# Patient Record
Sex: Female | Born: 1970 | Race: White | Hispanic: No | Marital: Married
Health system: Midwestern US, Community
[De-identification: ages and names within clinical notes are randomized; demographics above are authoritative.]

## PROBLEM LIST (undated history)

## (undated) DIAGNOSIS — D6859 Other primary thrombophilia: Secondary | ICD-10-CM

## (undated) DIAGNOSIS — G43909 Migraine, unspecified, not intractable, without status migrainosus: Secondary | ICD-10-CM

## (undated) DIAGNOSIS — M797 Fibromyalgia: Secondary | ICD-10-CM

## (undated) DIAGNOSIS — E039 Hypothyroidism, unspecified: Secondary | ICD-10-CM

## (undated) DIAGNOSIS — M5410 Radiculopathy, site unspecified: Secondary | ICD-10-CM

## (undated) DIAGNOSIS — R202 Paresthesia of skin: Secondary | ICD-10-CM

## (undated) DIAGNOSIS — Z87898 Personal history of other specified conditions: Secondary | ICD-10-CM

## (undated) DIAGNOSIS — R200 Anesthesia of skin: Secondary | ICD-10-CM

## (undated) DIAGNOSIS — B001 Herpesviral vesicular dermatitis: Secondary | ICD-10-CM

## (undated) DIAGNOSIS — R413 Other amnesia: Secondary | ICD-10-CM

## (undated) DIAGNOSIS — N6320 Unspecified lump in the left breast, unspecified quadrant: Secondary | ICD-10-CM

## (undated) DIAGNOSIS — N63 Unspecified lump in unspecified breast: Secondary | ICD-10-CM

## (undated) DIAGNOSIS — E538 Deficiency of other specified B group vitamins: Secondary | ICD-10-CM

## (undated) DIAGNOSIS — D122 Benign neoplasm of ascending colon: Secondary | ICD-10-CM

## (undated) DIAGNOSIS — J189 Pneumonia, unspecified organism: Secondary | ICD-10-CM

## (undated) DIAGNOSIS — E119 Type 2 diabetes mellitus without complications: Principal | ICD-10-CM

## (undated) DIAGNOSIS — R110 Nausea: Secondary | ICD-10-CM

## (undated) LAB — MO EKG ELECTROCARDIOGRAM TC
Atrial Rate: 80 {beats}/min
Atrial Rate: 90 {beats}/min
P Axis: -24 degrees
P Axis: 30 degrees
P-R Interval: 148 ms
P-R Interval: 150 ms
Q-T Interval: 360 ms
Q-T Interval: 384 ms
QRS Duration: 80 ms
QRS Duration: 82 ms
QTc Calculation (Bazett): 440 ms
QTc Calculation (Bazett): 442 ms
R Axis: -25 degrees
R Axis: 7 degrees
T Axis: -19 degrees
T Axis: 21 degrees
Ventricular Rate: 80 {beats}/min
Ventricular Rate: 90 {beats}/min

---

## 2010-05-27 ENCOUNTER — Inpatient Hospital Stay: Admit: 2010-05-27 | Discharge: 2010-05-27 | Attending: Emergency Medicine

## 2010-05-27 LAB — COMPREHENSIVE METABOLIC PANEL
ALT: 32 U/L (ref 10–40)
AST: 27 U/L (ref 15–37)
Albumin/Globulin Ratio: 1.6 (ref 1.1–2.2)
Albumin: 4.2 gm/dl (ref 3.4–5.0)
Alkaline Phosphatase: 98 U/L (ref 45–129)
Anion Gap: 7.9
BUN: 14 mg/dl (ref 7–18)
CO2: 27 mEq/L (ref 21–32)
Calcium: 9.2 mg/dl (ref 8.3–10.6)
Chloride: 108 mEq/L (ref 99–110)
Creatinine: 0.8 mg/dl (ref 0.6–1.1)
GFR Est, African/Amer: >60
GFR, Estimated: >60 (ref >60)
Glucose: 148 mg/dl — ABNORMAL HIGH (ref 70–99)
Potassium: 3.5 mEq/L (ref 3.5–5.1)
Sodium: 139 mEq/L (ref 136–145)
Total Bilirubin: 0.4 mg/dl (ref 0.0–1.0)
Total Protein: 6.9 gm/dl (ref 6.4–8.2)

## 2010-05-27 LAB — CBC WITH AUTO DIFFERENTIAL
Basophils %: 0.4 % (ref 0.0–2.0)
Basophils Absolute: 0 10*3 (ref 0.0–0.2)
Eosinophils %: 1.4 % (ref 0.0–5.0)
Eosinophils Absolute: 0.1 10*3 (ref 0.0–0.6)
Granulocyte Absolute Count: 3.1 10*3 (ref 1.7–7.7)
Hematocrit: 34.6 % — ABNORMAL LOW (ref 36.0–48.0)
Hemoglobin: 11.8 gm/dl — ABNORMAL LOW (ref 12.0–16.0)
Lymphocytes %: 35.2 % (ref 25.0–40.0)
Lymphocytes Absolute: 2 10*3 (ref 1.0–5.1)
MCH: 29.5 pg (ref 26–34)
MCHC: 34 gm/dl (ref 31–36)
MCV: 86.9 fl (ref 80–100)
MPV: 9 fl (ref 5.0–10.5)
Monocytes %: 7.8 % (ref 0.0–12.0)
Monocytes Absolute: 0.4 10*3 (ref 0.0–1.3)
Platelets: 265 10*3 (ref 135–450)
RBC: 3.98 10*6 — ABNORMAL LOW (ref 4.0–5.2)
RDW: 12.8 % (ref 11.5–14.5)
Segs Relative: 55.2 % (ref 42.0–63.0)
WBC: 5.6 10*3 (ref 4.0–11.0)

## 2010-05-27 LAB — TROPONIN
Troponin I: <0.006 ng/ml
Troponin I: <0.006 ng/ml

## 2010-05-27 MED ORDER — KETOROLAC TROMETHAMINE 30 MG/ML IJ SOLN
30 MG/ML | Freq: Once | INTRAMUSCULAR | Status: AC
Start: 2010-05-27 — End: 2010-05-27
  Administered 2010-05-27: 05:00:00 30 mg via INTRAVENOUS

## 2010-05-27 MED ORDER — TRAMADOL HCL 50 MG PO TABS
50 MG | Freq: Once | ORAL | Status: AC
Start: 2010-05-27 — End: 2010-05-27
  Administered 2010-05-27: 05:00:00 50 mg via ORAL

## 2010-05-27 MED FILL — KETOROLAC TROMETHAMINE 30 MG/ML IJ SOLN: 30 MG/ML | INTRAMUSCULAR | Qty: 1

## 2010-05-27 MED FILL — ULTRAM 50 MG PO TABS: 50 MG | ORAL | Qty: 1

## 2010-05-27 NOTE — Discharge Instructions (Signed)
Cardiac Biomarkers     Cardiac biomarkers are enzymes, proteins, and hormones that are associated with heart function, damage or failure. Some of the tests are specific for the heart while others are also elevated with skeletal muscle damage. Cardiac biomarkers are used for diagnostic and prognostic purposes and are frequently ordered by caregivers when someone comes into the Emergency Room complaining of symptoms, such as chest pain, pressure, nausea, and shortness of breath. These tests are ordered, along with other laboratory and non-laboratory tests, to detect heart failure (which is often a chronic, progressive condition affecting the ability of the heart to fill with blood and pump efficiently) and the acute coronary syndromes (ACS) as well as to help determine prognosis for people who have had a heart attack. ACS is a group of symptoms that reflect a sudden decrease in the amount of blood and oxygen, also termed 'ischemia,' reaching the heart. This decrease is frequently due to either a narrowing of the coronary arteries (atherosclerosis or vessel spasm) or unstable plaques, which can cause a blood clot (thrombus) and blockage of blood flow. If the oxygen supply is low, it can cause angina (pain); if blood flow is reduced, it can cause death of heart cells (called myocardial infarction or heart attack) and can lead to death of the affected heart muscle cells and to permanent damage and scarring of the heart.   The goal with cardiac biomarkers is to be able to detect the presence and severity of an acute heart condition as soon as possible so that appropriate treatment can be initiated.      There are only a few cardiac biomarkers that are being routinely used by physicians. Some have been phased out because they are not as specific as the marker of choice - troponin. Many other potential cardiac biomarkers are still being researched but their clinical utility has yet to be established.      Note: Cardiac  biomarkers are not the same tests as those that are used to screen the general healthy population for their risk of developing heart disease. Those can be found under Cardiac Risk Assessment.     LABORATORY TESTS CURRENT CARDIAC BIOMARKERS    CK and CK-MB   Troponin     BNP or (NT-proBNP)   Myoglobin (not always used; sometimes ordered with troponin)       MORE GENERAL TESTS FREQUENTLY ORDERED ALONG WITH CARDIAC BIOMARKERS    Blood Gases   CMP   BMP   Electrolytes   CBC       ON THE HORIZON  Ischemia modified albumin (IMA) - Test has received FDA approval for use with troponin and electrocardiogram to rule out acute coronary syndrome (ACS) in patients with chest pain. May become useful for identifying patients at higher risk of heart attack and potentially could replace myoglobin one day.      NON-LABORATORY TESTS  These tests allow caregivers to look at the size, shape, and function of the heart as it is beating. They can be used to detect changes to the rhythm of the heart as well as to detect and evaluate damaged tissues and blocked arteries.    EKG (ECG, electrocardiogram)   Nuclear scan     Coronary angiography (or arteriography)   ECG (echocardiogram)     Stress testing   Chest X-ray       THE FOLLOWING TABLE SUMMARIZES CURRENTLY USED CARDIAC BIOMARKERS.  Marker What Where Found What Indicates Time to Increase Time back to Normal  When/How Used   CK Enzyme that exists in three different isoforms Heart, brain, and skeletal muscle Injury to muscle cells 4 to 6 hours after injury, peaks in 18 to 24 hours Normal in 48 to 72 hours, unless due to continuing injury  Being phased out, may be ordered prior to CK-MB   CK-MB    Heart- related portion of total CK enzyme Heart primarily, but also in skeletal muscle Injury (cell death) to heart 4 to 6 hrs after heart attack, peaks in 12 to 20 hours Returns to normal in 24 to 48 hours unless new/continual damage Not as specific as Troponin for heart  injury/attack, may be ordered when Troponin is not available, may be ordered to monitor new/continuing damage   Myoglobin    Small oxygen-storing protein    Heart and other muscle cells Injury to heart or other muscle cells. Also elevated with kidney problems. Starts to rise within 2 to 3 hours, peaks in 8 to 12 hours.    Falls back to normal by about one day after injury occurred    Ordered along with Troponin, helps diagnose heart injury/attack   Cardiac Troponin Components of a Regulatory protein complex. Two cardiac specific isoforms: T   and I  Heart muscle Heart injury/damage 4 to 8 hours Remains elevated for 7 to 14 days Ordered to help assess prognosis and diagnose heart attack   LDH Enzyme Almost all body tissues General marker of injury to cells     Phased out, not specific   AST> Enzyme Heart, liver, and muscles Injury to liver, muscle, or heart     Phased out, not heart-specific   Hs-CRP Protein Associated with athero-  sclerosis  Inflam-matory process   Elevated with inflammation May help determine prognosis of patients who've had heart attack   BNP Hormone Heart's left ventricle Heart failure   Elevation related to severity Help diagnose and evaluate heart failure, prognosis, and to monitor therapy      FOR MORE INFORMATION, VISIT: [email or web address]     Document Released: 12/25/2004    [facility] Patient Information 2011 [place].Chest Pain (Non-Specific)     Today you have had an exam and tests to determine a specific cause for your chest pain. It is often hard to give a specific diagnosis for the cause of one's chest pain.  There is always a chance that your pain could be related to something serious, like a heart attack or a blood clot in the lungs. You need to follow up with your caregiver for further evaluation. More lab tests or other studies such as x-rays, an electrocardiogram, stress testing, or cardiac imaging may be needed to find the cause of your pain.      Most of the  time, nonspecific chest pain will be improved within 2-3 days of rest and mild pain medicine.  For the next few days, avoid physical exertion or activities that bring on the pain. Do not smoke or drink alcohol until all your symptoms are gone. Quitting smoking is the number one way to reduce your risk for heart and lung disease. Call your caregiver for routine follow-up as advised.      CAUSES   Heart burn is caused by stomach acid going back up into the esophagus. The esophagus is the tube between the mouth and the stomach. The acid burns the sensitive inner layer of the esophagus. This causes pain which is felt in the chest under the breast bone. Heart burn is also  called GERD (gastroesophageal reflux disease).   Pneumonia or bronchitis can cause painful irritation of the lung tissues.   Anxiety and stress may cause tightness in the chest associated with pain.   Inflammation around your heart (pericarditis) or lung (pleuritis, or pleurisy) may cause chest pain.   A blood clot can develop in the lung and cause chest pain.    A collapsed lung (pneumothorax) can cause chest pain. It can develop suddenly on its own (a spontaneous pneumothorax) or from trauma to the chest.    The chest wall is composed of bones, muscles and cartilage. Any of these can be the source of the pain:   l The bones can be bruised by injury.   l The muscles or cartilage can be strained by coughing or overwork.    l The cartilage can also be affected by inflammation and become sore (costochondritis).     TREATMENT  Treatment depends on what may be causing your chest pain. Treatment may include:   Acid blockers for heart burn.   Anti-inflammatory medicine.  Pain medicine for inflammatory conditions.   Antibiotics if an infection is present.    You may be advised to change lifestyle habits that may add to your chest pain. These include stopping smoking, caffeine and chocolate. You may be also advised to keep your head elevated when  sleeping. This reduces the chance of acid going backward from your stomach to your esophagus.     HOME CARE INSTRUCTIONS   If antibiotics were prescribed, take the full amount even if you are feeling better.    Continue physical activities as directed.   Only take over-the-counter or prescription medicine for pain, discomfort or fever as directed by your caregiver.   Follow your caregiver's suggestions for further testing if problems persist.   If your caregiver has given you a follow-up appointment, it is very important to keep that appointment. Not keeping the appointment could result in a chronic or permanent injury, pain, and disability. If there is any problem keeping the appointment, you must call back to this facility for assistance.      SEEK MEDICAL CARE IF:   You are having problems that you think may be side effects of the medicine you are taking. Read your medication instructions carefully.   Your chest pain persists even after following advised treatments.   You develop a rash on your chest with blisters.     SEEK IMMEDIATE MEDICAL CARE IF:   You have increased chest pain, or pain that spreads to the arm, neck, jaw, back or abdomen.    You develop shortness of breath, increasing cough or are coughing up blood.   You have severe back or abdominal pain, nausea or vomiting.   You develop severe weakness, fainting, fever or chills.     THIS IS AN EMERGENCY. Do not wait to see if the pain will go away. Get medical help at once. Call Your Local Emergency Department (911 in the [place] Do not drive yourself to the hospital.     MAKE SURE YOU:    Understand these instructions.    Will watch your condition.   Will get help right away if you are not doing well or get worse.     Document Released: 03/10/2008  Document Re-Released: 12/24/2009  [facility] Patient Information 2011 [place].

## 2010-05-27 NOTE — ED Provider Notes (Signed)
Patient is a 39 y.o. female presenting with chest pain. The history is provided by the patient.   Chest Pain  The chest pain began 3 - 5 hours ago. Chest pain occurs frequently. The chest pain is unchanged. The pain is associated with breathing. The severity of the pain is moderate. The quality of the pain is described as heavy. The pain radiates to the left shoulder. Primary symptoms include cough. Pertinent negatives for primary symptoms include no fever, no shortness of breath, no wheezing, no palpitations, no abdominal pain, no vomiting and no altered mental status.   Pertinent negatives for associated symptoms include no diaphoresis, no lower extremity edema and no near-syncope. She tried nothing for the symptoms.   Pertinent negatives for past medical history include no cancer, no congenital heart disease, no CHF, no diabetes, no MI, no PE and no seizures.   Her family medical history is significant for CAD in family and diabetes in family.         Review of Systems   Constitutional: Negative.  Negative for fever and diaphoresis.   HENT: Negative.    Eyes: Negative.    Respiratory: Positive for cough. Negative for choking, shortness of breath and wheezing.    Cardiovascular: Positive for chest pain. Negative for palpitations, leg swelling and near-syncope.   Gastrointestinal: Negative for vomiting and abdominal pain.   Genitourinary: Negative.    Musculoskeletal: Negative.    Skin: Negative.    Neurological: Negative for seizures.   Psychiatric/Behavioral: Negative for altered mental status.       Physical Exam   Constitutional: She is oriented to person, place, and time. She appears well-developed and well-nourished.   HENT:   Head: Normocephalic and atraumatic.   Eyes: EOM are normal.   Neck: Neck supple.   Cardiovascular: Normal rate, regular rhythm and normal heart sounds.    Pulmonary/Chest: No respiratory distress. She has no wheezes. She has no rales. She exhibits no tenderness.   Abdominal: Soft.  Bowel sounds are normal. She exhibits no distension. No tenderness. She has no rebound.   Neurological: She is alert and oriented to person, place, and time.   Skin: Skin is warm and dry. No rash noted.   Psychiatric: She has a normal mood and affect.       Procedures    MDM    Labs  Results for orders placed during the hospital encounter of 05/26/10   CBC WITH AUTO DIFFERENTIAL       Component Value Range    WBC 5.6  4.0 - 11.0 (X 1000)    RBC 3.98 (*) 4.0 - 5.2 (X(10)6)    Hemoglobin 11.8 (*) 12.0 - 16.0 (gm/dl)    Hematocrit 16.1 (*) 36.0 - 48.0 (%)    MCV 86.9  80 - 100 (fl)    MCH 29.5  26 - 34 (pg)    MCHC 34.0  31 - 36 (gm/dl)    RDW 09.6  04.5 - 40.9 (%)    Platelets 265  135 - 450 (X(10)3)    MPV 9.0  5.0 - 10.5 (fl)    Segs Relative 55.2  42.0 - 63.0 (%)    Lymphocytes Relative 35.2  25.0 - 40.0 (%)    Monocytes Relative 7.8  0.0 - 12.0 (%)    Eosinophils Relative 1.4  0.0 - 5.0 (%)    Basophils Relative 0.4  0.0 - 2.0 (%)    Grans (Absolute) 3.1  1.7 - 7.7 (X(10)3)  Lymphocytes Absolute 2.0  1.0 - 5.1 (X(10)3)    Monocytes Absolute 0.4  0.0 - 1.3 (X(10)3)    Eosinophils Absolute 0.1  0.0 - 0.6 (X(10)3)    Basophils Absolute 0.0  0.0 - 0.2 (X(10)3)    Differential Type Auto     COMPREHENSIVE METABOLIC PANEL       Component Value Range    Sodium 139  136 - 145 (mEq/L)    Potassium 3.5  3.5 - 5.1 (mEq/L)    Chloride 108  99 - 110 (mEq/L)    CO2 27  21 - 32 (mEq/L)    Anion Gap 7.9      Glucose 148 (*) 70 - 99 (mg/dl)    BUN 14  7 - 18 (mg/dl)    Creatinine, Ser 0.8  0.6 - 1.1 (mg/dl)    Total Protein 6.9  6.4 - 8.2 (gm/dl)    Alb 4.2  3.4 - 5.0 (gm/dl)    Albumin/Globulin Ratio 1.6  1.1 - 2.2     Total Bilirubin 0.4  0.0 - 1.0 (mg/dl)    Alkaline Phosphatase 98  45 - 129 (U/L)    AST 27  15 - 37 (U/L)    ALT 32  10 - 40 (U/L)    Calcium 9.2  8.3 - 10.6 (mg/dl)    GFR Est >16  >10 RU/EAV/4.09W1     GFR Est, African/Amer >60  SEE BELOW    TROPONIN I       Component Value Range    Troponin I <0.006  SEE BELOW  (ng/ml)   TROPONIN I       Component Value Range    Troponin I <0.006  SEE BELOW (ng/ml)       Radiology  Preliminary x-ray interpretation by Alexis Frock, MD   independently, in absence of radiologist (Final interpretation by radiologist to follow):    Chest: was negative for infiltrate, effusion, pneumothorax, or wide mediastinum     EKG Interpretation  EKG interpreted by Alexis Frock, MD:  NSR, no ST segment changes and no prior EKG available for comparison. Computer reports "cannot rule out ant infarct, age undetermined". I disagree with this part of the reading.  EKG interpreted by Alexis Frock, MD:  NSR and nonspecific ST-T wave changes  Pt has left sided chest heaviness discomfort. Initial EKG and tropi are normal.  She has very few,fam hx, risk factors for cardiac disease.  Will get repeat EKG and Trop i at this time.2am.    Second ekg and tropi done, no significant findings.  I explained these findings to pt and husband, they are aware that this is not a test for CAD, but rather ann indication that no acute myocardial damage has occurred.    Alexis Frock, MD  05/27/10 [phone]

## 2010-12-28 ENCOUNTER — Inpatient Hospital Stay: Admit: 2010-12-28 | Discharge: 2010-12-28 | Attending: Emergency Medicine

## 2010-12-28 LAB — COMPREHENSIVE METABOLIC PANEL
ALT: 21 U/L (ref 10–40)
AST: 22 U/L (ref 15–37)
Albumin/Globulin Ratio: 1.5 (ref 1.1–2.2)
Albumin: 4.4 gm/dl (ref 3.4–5.0)
Alkaline Phosphatase: 92 U/L (ref 45–129)
Anion Gap: 8.9
BUN: 12 mg/dl (ref 7–18)
CO2: 30 mEq/L (ref 21–32)
Calcium: 9.2 mg/dl (ref 8.3–10.6)
Chloride: 105 mEq/L (ref 99–110)
Creatinine: 0.8 mg/dl (ref 0.6–1.1)
GFR Est, African/Amer: >60
GFR, Estimated: >60 (ref >60)
Glucose: 121 mg/dl — ABNORMAL HIGH (ref 70–99)
Potassium: 3.5 mEq/L (ref 3.5–5.1)
Sodium: 140 mEq/L (ref 136–145)
Total Bilirubin: 0.3 mg/dl (ref 0.0–1.0)
Total Protein: 7.3 gm/dl (ref 6.4–8.2)

## 2010-12-28 LAB — CBC WITH AUTO DIFFERENTIAL
Basophils %: 0.5 % (ref 0.0–2.0)
Basophils Absolute: 0 10*3 (ref 0.0–0.2)
Eosinophils %: 1.2 % (ref 0.0–5.0)
Eosinophils Absolute: 0.1 10*3 (ref 0.0–0.6)
Granulocyte Absolute Count: 2.2 10*3 (ref 1.7–7.7)
Hematocrit: 35.3 % — ABNORMAL LOW (ref 36.0–48.0)
Hemoglobin: 11.8 gm/dl — ABNORMAL LOW (ref 12.0–16.0)
Lymphocytes %: 42.3 % — ABNORMAL HIGH (ref 25.0–40.0)
Lymphocytes Absolute: 2.1 10*3 (ref 1.0–5.1)
MCH: 27.7 pg (ref 26–34)
MCHC: 33.3 gm/dl (ref 31–36)
MCV: 83.3 fl (ref 80–100)
MPV: 9.1 fl (ref 5.0–10.5)
Monocytes %: 9.9 % (ref 0.0–12.0)
Monocytes Absolute: 0.5 10*3 (ref 0.0–1.3)
Platelets: 272 10*3 (ref 135–450)
RBC: 4.24 10*6 (ref 4.0–5.2)
RDW: 13.7 % (ref 11.5–14.5)
Segs Relative: 46.1 % (ref 42.0–63.0)
WBC: 4.9 10*3 (ref 4.0–11.0)

## 2010-12-28 LAB — LIPASE: Lipase: 26.2 U/L (ref 5.6–51.3)

## 2010-12-28 LAB — URINALYSIS
Bilirubin, Urine: NEGATIVE
Blood, Urine: NEGATIVE
Glucose, UA: NEGATIVE mg/dl
Ketones, Urine: NEGATIVE mg/dl
Leukocyte Esterase, Urine: NEGATIVE
Nitrite, Urine: NEGATIVE
Protein, UA: NEGATIVE mg/dl
Specific Gravity, UA: <=1.005 (ref 1.005–1.030)
Urobilinogen, Urine: 0.2 EU/dl (ref <2.0)
pH, UA: 5.5 (ref 4.5–8.0)

## 2010-12-28 LAB — D-DIMER, QUANTITATIVE: D-Dimer, Quant: <200 ng/ml DDU (ref <230)

## 2010-12-28 LAB — PREGNANCY, URINE: Pregnancy, Urine: NEGATIVE

## 2010-12-28 LAB — PROTIME/INR & PTT
INR: 2.05 — ABNORMAL HIGH
Protime: 23 s — ABNORMAL HIGH (ref 9.5–12.5)
aPTT: 37.6 s — ABNORMAL HIGH

## 2010-12-28 LAB — TROPONIN: Troponin I: <0.006 ng/ml

## 2010-12-28 MED ORDER — NITROGLYCERIN 0.4 MG SL SUBL
0.4 MG | ORAL_TABLET | Freq: Once | SUBLINGUAL | Status: AC | PRN
Start: 2010-12-28 — End: 2010-12-28

## 2010-12-28 MED ORDER — ASPIRIN 81 MG PO CHEW
81 MG | Freq: Once | ORAL | Status: AC
Start: 2010-12-28 — End: 2010-12-28
  Administered 2010-12-28: 08:00:00 via ORAL

## 2010-12-28 MED ORDER — SODIUM CHLORIDE 0.9 % IV SOLN
0.9 % | Freq: Once | INTRAVENOUS | Status: AC
Start: 2010-12-28 — End: 2010-12-28
  Administered 2010-12-28: 08:00:00 via INTRAVENOUS

## 2010-12-28 MED ORDER — ONDANSETRON HCL 4 MG/2ML IJ SOLN
4 MG/2ML | Freq: Once | INTRAMUSCULAR | Status: AC
Start: 2010-12-28 — End: 2010-12-28
  Administered 2010-12-28: 08:00:00 via INTRAVENOUS

## 2010-12-28 MED FILL — ONDANSETRON HCL 4 MG/2ML IJ SOLN: 4 MG/2ML | INTRAMUSCULAR | Qty: 2

## 2010-12-28 MED FILL — ASPIRIN 81 MG PO CHEW: 81 MG | ORAL | Qty: 1

## 2010-12-28 MED FILL — SODIUM CHLORIDE 0.9 % IV SOLN: 0.9 % | INTRAVENOUS | Qty: 1000

## 2010-12-28 NOTE — ED Notes (Signed)
1000 ML NS INFUSED.  IV SITE WITHOUT ERYTHEMA OR EDEMA.     Minna Antis, RN  12/28/10 [phone]

## 2010-12-28 NOTE — Discharge Instructions (Signed)
INSTRUCTION SHEETS GIVEN:    DIAGNOSIS:  Diagnoses of Chest pain and Near syncope were pertinent to this visit.        ADDITIONAL INSTRUCTIONS FOR ALL PATIENTS:  -If you have been prescribed an antibiotic TAKE IT AS DIRECTED UNTIL IT IS ALL FINISHED.  -If you HAVE RECEIVED OR BEEN PRESCRIBED A MEDICATION THAT MAY CAUSE DROWSINESS. DO NOT DRIVE, DRINK ALCOHOL, OR OPERATE MACHINERY THAT REQUIRES YOU TO BE ALERT.    -If you had an EKG and/or X-Ray reading made in the Emergency Department, it will be reviewed by a Cardiologist and/or Radiologist. If the review changes your diagnosis, you will be contacted.  -If you had a specimen collected for culture, a CULTURE REPORT takes 48-72 hours to generate: You will be contacted if a change in treatment is needed.    -Return if your condition worsens or if you have severe pain, worsening of symptoms such as fever, vomiting or difficulty breathing.     If you were prescribed an outpatient test Please Call [facility] SCH[facility] at [phone]  to schedule a time for your test that was ordered.      If you have any trouble getting to see the physician that we have referred you to today, please call the [facility] at [phone].  Please leave a voice message if they are unavailable and they will return your call.  Chest Pain (Nonspecific)  It is often hard to give a specific diagnosis for the cause of chest pain. There is always a chance that your pain could be related to something serious, such as a heart attack or a blood clot in the lungs. You need to follow up with your caregiver for further evaluation.  CAUSES   Heartburn.    Pneumonia or bronchitis.    Anxiety and stress.    Inflammation around your heart (pericarditis) or lung (pleuritis or pleurisy).    A blood clot in the lung.    A collapsed lung (pneumothorax). It can develop suddenly on its own (spontaneous pneumothorax) or from injury (trauma) to the chest.   The chest wall is composed of bones,  muscles, and cartilage. Any of these can be the source of the pain.   The bones can be bruised by injury.    The muscles or cartilage can be strained by coughing or overwork.    The cartilage can be affected by inflammation and become sore (costochondritis).   DIAGNOSIS  Lab tests or other studies, such as X-rays, an EKG, stress testing, or cardiac imaging, may be needed to find the cause of your pain.   TREATMENT   Treatment depends on what may be causing your chest pain. Treatment may include:    Acid blockers for heartburn.   Anti-inflammatory medicine.   Pain medicine for inflammatory conditions.   Antibiotics if an infection is present.     You may be advised to change lifestyle habits. This includes stopping smoking and avoiding caffeine and chocolate.    You may be advised to keep your head raised (elevated) when sleeping. This reduces the chance of acid going backward from your stomach into your esophagus.    Most of the time, nonspecific chest pain will improve within 2 to 3 days with rest and mild pain medicine.   HOME CARE INSTRUCTIONS   If antibiotics were prescribed, take the full amount even if you start to feel better.    For the next few days, avoid physical activities that bring on  chest pain. Continue physical activities as directed.    Do not smoke cigarettes or drink alcohol until your symptoms are gone.    Only take over-the-counter or prescription medicine for pain, discomfort, or fever as directed by your caregiver.    Follow your caregiver's suggestions for further testing if your chest pain does not go away.    Keep any follow-up appointments you made. If you do not go to an appointment, you could develop lasting (chronic) problems with pain. If there is any problem keeping an appointment, you must call to reschedule.   SEEK MEDICAL CARE IF:   You think you are having problems from the medicine you are taking. Read your medicine instructions carefully.    Your chest pain  does not go away, even after treatment.    You develop a rash with blisters on your chest.   SEEK IMMEDIATE MEDICAL CARE IF:   You have increased chest pain or pain that spreads to your arm, neck, jaw, back, or belly (abdomen).    You develop shortness of breath, an increasing cough, or you are coughing up blood.    You have severe back or abdominal pain, feel sick to your stomach (nauseous) or throw up (vomit).    You develop severe weakness, fainting, or chills.    You have an oral temperature above 102 F (38.9 C), not controlled by medicine.   THIS IS AN EMERGENCY. Do not wait to see if the pain will go away. Get medical help at once. Call your local emergency services (911 in [place] Do not drive yourself to the hospital.  MAKE SURE YOU:   Understand these instructions.    Will watch your condition.    Will get help right away if you are not doing well or get worse.   Document Released: 03/10/2008 Document Re-Released: 02/26/2010  [facility] Patient Information 2011 [place], [place].Near-Syncope  Near-fainting (near-syncope) is sudden weakness or dizziness when getting up or while standing. Sudden weakness or dizziness when getting up or while standing can be caused by a drop in blood pressure. This is a common reaction in people taking medicines to control their blood pressure. Fainting usually occurs when the blood pressure or pulse is too low to provide enough blood flow to the brain to keep you conscious. Fainting and near-syncope are not usually due to serious medical problems.  CAUSES  Causes of near-syncope include the following:   Drop in blood pressure.   Physical pain.    Dehydration.   Heat exhaustion.   Emotional distress.   Internal bleeding.   Heart and circulatory problems.    SYMPTOMS  Near-syncope symptoms include the following:   Dizziness   Feeling sick to your stomach (nauseous)   Body numbness   Turning pale   Tunnel vision   Generalized weakness.    HOME CARE  INSTRUCTIONS   If you or your child feels he or she is going to faint, lie down right away. Wait until all the symptoms have passed. Most of these episodes last only a few minutes. You or your child may feel tired for several hours.    Drink enough water and fluids to keep the urine clear or pale yellow.    If you or your child is taking blood pressure or heart medicine, it can help to get up slowly, taking several minutes to sit and then standing. This can reduce dizziness that is caused by a drop in blood pressure.   SEEK IMMEDIATE MEDICAL  CARE IF:   There is a severe headache.    There is unusual pain in the chest, abdomen, or back.    There are irregular heartbeats or a very rapid pulse.    You or your child has repeated fainting, or seizure-like jerking during an episode.    You or your child is fainting when sitting or lying down.    You or your child develops confusion.    You or your child has difficulty walking.    You or your child has severe weakness.    You or your child develops vision problems.   MAKE SURE YOU:   Understand these instructions.    Will watch this condition.    Will get help right away if you or your child is not doing well or gets worse.   Document Released: 12/02/2005 Document Re-Released: 05/22/2010  [facility] Patient Information 2011 [place], [place].

## 2010-12-28 NOTE — ED Notes (Signed)
PATIENT REPORTS THAT HER CARDIOLOGIST IS Hollice Espy AT [facility] [phone].     Minna Antis, RN  12/28/10 0225

## 2010-12-28 NOTE — ED Provider Notes (Signed)
HPI Comments: Pt c/o feeling ill for past 3 days, dizzy/lightheaded, nauseated, chest tightness, fatigued, chest pain, sob..  Today felt dizzy, near syncope (did not completely have a syncopal event), face felt flushed and nauseated.  No vomiting.  No fever, no diarrhea, no cough,  No dib.    Pt on coumadin for PE, has had chest pain in past but never with nausea or dizzy sensation.  Prot S deficiency.    Has seen cardiologist [facility] ([place]),   Chest pain/tightness intermittent, every 4-5 hours, lasts few minutest to up to few hours at max.    All symptoms are intermittent typically lasting less than a minute.  tonite felt fatigued, very lightheaded thought she may pass out this was most severe episode lasted for 5 min and became concerned so came to er for further eval.  No numbness/tingling.  No leg swelling/pain.    Pt currently asymptomatic in ED without complaints.    Patient is a 40 y.o. female presenting with chest pain. The history is provided by the patient and medical records.   Chest Pain  Chest pain occurs intermittently. The chest pain is resolved. At its most intense, the pain is at 6/10. The pain is currently at 0/10. The severity of the pain is moderate. The quality of the pain is described as tightness. The pain radiates to the left shoulder and left neck. Primary symptoms include fatigue, nausea and dizziness. Pertinent negatives for primary symptoms include no fever, no cough and no vomiting.   Dizziness also occurs with nausea and weakness. Dizziness does not occur with vomiting or diaphoresis.     Associated symptoms include weakness.   Pertinent negatives for associated symptoms include no diaphoresis. She tried antacids for the symptoms. Risk factors: prot s defic.   Her past medical history is significant for PE.   Procedure history is positive for echocardiogram.         Review of Systems   Constitutional: Positive for fatigue. Negative for fever and diaphoresis.   Respiratory:  Positive for chest tightness. Negative for cough.    Cardiovascular: Positive for chest pain. Negative for leg swelling.   Gastrointestinal: Positive for nausea. Negative for vomiting.   Neurological: Positive for dizziness, weakness and light-headedness.   All other systems reviewed and are negative.        Physical Exam   Nursing note and vitals reviewed.  Constitutional: She is oriented to person, place, and time. She appears well-developed and well-nourished.   HENT:   Head: Normocephalic and atraumatic.   Right Ear: External ear normal.   Left Ear: External ear normal.   Mouth/Throat: Oropharynx is clear and moist.   Eyes: Conjunctivae and EOM are normal. Pupils are equal, round, and reactive to light.   Neck: Normal range of motion. Neck supple.   Cardiovascular: Normal rate, regular rhythm, normal heart sounds and intact distal pulses.    No murmur heard.  Pulmonary/Chest: Effort normal and breath sounds normal.   Abdominal: Soft. Bowel sounds are normal. Generalized Tenderness (mild) is present. She has no rigidity, no rebound and no guarding.   Musculoskeletal: Normal range of motion. She exhibits no edema and no tenderness.   Neurological: She is alert and oriented to person, place, and time. She has normal reflexes. No cranial nerve deficit. She exhibits normal muscle tone.   Skin: Skin is warm and dry. No rash noted. She is not diaphoretic.   Psychiatric: She has a normal mood and affect. Her behavior is normal.  Procedures    MDM    Labs  Results for orders placed during the hospital encounter of 12/28/10   CBC WITH AUTO DIFFERENTIAL       Component Value Range    WBC 4.9  4.0 - 11.0 (X 1000)    RBC 4.24  4.0 - 5.2 (X(10)6)    Hemoglobin 11.8 (*) 12.0 - 16.0 (gm/dl)    Hematocrit 45.4 (*) 36.0 - 48.0 (%)    MCV 83.3  80 - 100 (fl)    MCH 27.7  26 - 34 (pg)    MCHC 33.3  31 - 36 (gm/dl)    RDW 09.8  11.9 - 14.7 (%)    Platelets 272  135 - 450 (X(10)3)    MPV 9.1  5.0 - 10.5 (fl)    Segs Relative  46.1  42.0 - 63.0 (%)    Lymphocytes Relative 42.3 (*) 25.0 - 40.0 (%)    Monocytes Relative 9.9  0.0 - 12.0 (%)    Eosinophils Relative 1.2  0.0 - 5.0 (%)    Basophils Relative 0.5  0.0 - 2.0 (%)    GRANULOCYTE ABSOLUTE COUNT 2.2  1.7 - 7.7 (X(10)3)    Lymphocytes Absolute 2.1  1.0 - 5.1 (X(10)3)    Monocytes Absolute 0.5  0.0 - 1.3 (X(10)3)    Eosinophils Absolute 0.1  0.0 - 0.6 (X(10)3)    Basophils Absolute 0.0  0.0 - 0.2 (X(10)3)    Differential Type Auto     COMPREHENSIVE METABOLIC PANEL       Component Value Range    Sodium 140  136 - 145 (mEq/L)    Potassium 3.5  3.5 - 5.1 (mEq/L)    Chloride 105  99 - 110 (mEq/L)    CO2 30  21 - 32 (mEq/L)    Anion Gap 8.9      Glucose 121 (*) 70 - 99 (mg/dl)    BUN 12  7 - 18 (mg/dl)    Creatinine, Ser 0.8  0.6 - 1.1 (mg/dl)    Total Protein 7.3  6.4 - 8.2 (gm/dl)    Alb 4.4  3.4 - 5.0 (gm/dl)    Albumin/Globulin Ratio 1.5  1.1 - 2.2     Total Bilirubin 0.3  0.0 - 1.0 (mg/dl)    Alkaline Phosphatase 92  45 - 129 (U/L)    AST 22  15 - 37 (U/L)    ALT 21  10 - 40 (U/L)    Calcium 9.2  8.3 - 10.6 (mg/dl)    GFR Est >82  >95 AO/ZHY/8.65H8     GFR Est, African/Amer >60  SEE BELOW    LIPASE       Component Value Range    Lipase 26.2  5.6 - 51.3 (U/L)   TROPONIN I       Component Value Range    Troponin I <0.006  SEE BELOW (ng/ml)   URINALYSIS       Component Value Range    Color, UA Yellow  Yellow     Clarity, UA Clear  Clear     Glucose, UA Neg  Neg (mg/dl)    Bilirubin, Urine Neg  Neg     Ketones, Urine Neg  Neg (mg/dl)    Specific Gravity, UA <=1.005  1.005 - 1.030     Blood, Urine Neg  Neg     pH, UA 5.5  4.5 - 8.0     Protein, UA Neg  Neg (mg/dl)  Urobilinogen, Urine 0.2  <2.0 (EU/dl)    Nitrite, Urine Neg  Neg     Leukocyte Esterase, Urine Neg  Neg    PREGNANCY, URINE       Component Value Range    Pregnancy, Urine Neg  SEE BELOW    PROTIME/INR & PTT       Component Value Range    Protime 23.0 (*) 9.5 - 12.5 (sec)    INR 2.05 (*) SEE BELOW     aPTT 37.6 (*) SEE BELOW  (sec)   D-DIMER, QUANTITATIVE       Component Value Range    D-Dimer, Quant <200  <230 (ng/ml DDU)         Radiology  Ct chest - nap  Ct head - nap     EKG Interpretation  3:51 AM This EKG was interpreted contempraneously by myself in the abscence of a cardiologist showing:  Normal Sinus rhythm   Rate of   75  Axis is   Normal  QTc is  within an acceptable range  Intervals and Durations are unremarkable.      No evidence of acute ischemia.    4:45 AM  Pt still asymptomatic.  Advised on admission for r/o acs.  Pt declined and would prefer outpt management.  Called pts cardiologist.  Discussed case and results.  Cardiologist states pt may f/u with him later today and will arrange for cardiac stress.  Advised pt to f/u with her cardiologist later today and return to er anytime if symptoms reoccur.          Jackson Latino, MD  12/29/10 [phone]

## 2010-12-28 NOTE — ED Notes (Signed)
CT STUDIES COMPLETED.    Minna Antis, RN  12/28/10 [phone]

## 2010-12-28 NOTE — ED Notes (Signed)
AMBULATE TO CT.    Minna Antis, RN  12/28/10 [phone]

## 2010-12-28 NOTE — ED Notes (Signed)
COPIES OF LABS, EKG, AND CT RESULTS PROVIDED TO PATIENT TO GIVE TO CARDIOLOGIST AND PMD.    Minna Antis, RN  12/28/10 [phone]

## 2011-06-16 ENCOUNTER — Inpatient Hospital Stay: Admit: 2011-06-16 | Discharge: 2011-06-17 | Attending: Emergency Medicine

## 2011-06-16 NOTE — ED Provider Notes (Addendum)
Patient states that she has had a headache intermittently since Thursday. Behind her right eye and right temple area. Took ibuprofen and tylenol and aleve. H/O migraines and this pain is typical of her migraines. Used to take trexomet prescribed by Dr. Bonnita Nasuti for migraines but does not anymore because her insurance no longer covers it. Nausea. No vomiting. Light sensitivity. Typical of her migraines, now new or unusual symptoms. Has come to ED for migraine once before in the back of her head and received a shot. Takes blood thinners for blood clotting (coumadin). No trauma. Has ultram at home, but it doesn't work. Headache has been right sided, but here in ED is also starting to hurt on left side.    Patient is a 40 y.o. female presenting with headaches. The history is provided by the patient.   Headache  The primary symptoms include headaches, visual change and nausea. Primary symptoms do not include syncope, dizziness, fever or vomiting. The symptoms began 3 to 5 days ago. The symptoms are waxing and waning.   The headache began more than 2 days ago. The headache developed suddenly. Headache is a recurrent problem. Location/region(s) of the headache: frontal. The headache is associated with photophobia and visual change.   The visual change began more than 2 days ago. The visual change has been unchanged since its onset.The visual change includes photophobia.   Nausea began 3 to 5 days ago.   Additional symptoms include photophobia.         PAST MEDICAL HISTORY   has a past medical history of Fibromyalgia; Asthma; Pleurisy; GERD (gastroesophageal reflux disease); Protein S deficiency; Hypothyroidism; Pulmonary embolism; and Migraine.    PAST SURGICAL HISTORY   has past surgical history that includes pelvic laparoscopy.    FAMILY HISTORY  family history is not on file.    SOCIAL HISTORY   reports that she has never smoked. She does not have any smokeless tobacco history on file. She reports that she does not drink  alcohol or use illicit drugs.    HOME MEDICATIONS     Prior to Admission medications    Medication Sig Start Date End Date Taking? Authorizing Provider   aspirin EC 81 MG EC tablet Take 81 mg by mouth daily.      Historical Provider, MD   warfarin (COUMADIN) 1 MG tablet Take 4 mg by mouth Daily.      Historical Provider, MD   Sumatriptan-Naproxen Sodium (TREXIMET) 85-500 MG TABS tablet Take 1 tablet by mouth once.      Historical Provider, MD   aspirin-acetaminophen-caffeine (EXCEDRIN MIGRAINE) [phone] MG per tablet Take 1 tablet by mouth every 6 hours as needed.      Historical Provider, MD   acetaminophen (TYLENOL) 500 MG tablet Take 1,000 mg by mouth every 6 hours as needed.      Historical Provider, MD   propranolol (INDERAL) 20 MG tablet Take 40 mg by mouth nightly.    Historical Provider, MD   FA-Pyridoxine-Cyancobalamin (FOLBIC PO) Take  by mouth.      Historical Provider, MD   vitamin D (ERGOCALCIFEROL) 50000 UNIT CAPS capsule Take 50,000 Units by mouth once a week.      Historical Provider, MD   levothyroxine (SYNTHROID) 25 MCG tablet Take 50 mcg by mouth daily.      Historical Provider, MD   tramadol (ULTRAM) 50 MG tablet Take 50 mg by mouth every 6 hours as needed.      Historical Provider, MD  lansoprazole (PREVACID) 30 MG capsule Take 30 mg by mouth daily.      Historical Provider, MD        ALLERGIES  is allergic to phenergan; codeine; percocet; and vicodin.     Review of Systems   Constitutional: Negative for fever.   Eyes: Positive for photophobia.   Cardiovascular: Negative for syncope.   Gastrointestinal: Positive for nausea. Negative for vomiting.   Skin: Negative for wound.   Neurological: Positive for headaches. Negative for dizziness.   All other systems reviewed and are negative.        Physical Exam   Constitutional: She is oriented to person, place, and time. She appears well-developed and well-nourished.   HENT:   Head: Normocephalic and atraumatic.   Mouth/Throat: No oropharyngeal  exudate, posterior oropharyngeal edema or posterior oropharyngeal erythema.   Eyes: Conjunctivae and EOM are normal. Pupils are equal, round, and reactive to light.   Neck: Normal range of motion. Neck supple. No JVD present.   Cardiovascular: Normal rate, regular rhythm and intact distal pulses.    Pulmonary/Chest: Effort normal and breath sounds normal. No respiratory distress.   Abdominal: Soft. Bowel sounds are normal. She exhibits no distension and no mass. There is no tenderness. There is no rigidity, no rebound and no guarding.   Musculoskeletal: Normal range of motion. She exhibits no edema.   Neurological: She is alert and oriented to person, place, and time. She has normal strength. No cranial nerve deficit or sensory deficit. She exhibits normal muscle tone. Coordination normal.   Skin: Skin is warm and dry. No rash noted.   Psychiatric: She has a normal mood and affect. Her behavior is normal.       Procedures    MDM  Number of Diagnoses or Management Options      I estimate there is LOW risk for SUBARACHNOID HEMORRHAGE, MENINGITIS, INTRACRANIAL HEMORRHAGE, SUBDURAL OR EPIDURAL HEMATOMA, OR STROKE, thus I consider the discharge disposition reasonable. The patient and/or family and I have discussed the diagnosis and risks, and we agree with discharging home to follow-up with their primary doctor. We also discussed returning to the Emergency Department immediately if new or worsening symptoms occur. We have discussed the symptoms which are most concerning (e.g., changing or worsening pain, weakness, vomiting, fever) that necessitate immediate return.    Scribe Authentication: All medical record entries made by the scribe were at my direction. I have reviewed the chart and agree that the record accurately reflects the my work and the decisions made by me, Arman Filter, MD    All entries by Duyen Beckom,Shea are made while acting as a scribe for Arman Filter, MD.      Arman Filter, MD  06/16/11  [phone]    Arman Filter, MD  06/16/11 2134

## 2011-06-16 NOTE — Discharge Instructions (Signed)
IMPORTANT:  If you have any trouble getting in to see the physician that we have referred you to today, please call the [facility] at [phone].  Please leave a voice message if they are unavailable and they will return your call.    If you were prescribed an outpatient test, please call [facility] at [phone] to schedule an appointment for your test that was ordered.         DIAGNOSIS:  The encounter diagnosis was Migraine.      ADDITIONAL INSTRUCTIONS FOR ALL PATIENTS:  -If you have been prescribed an antibiotic TAKE IT AS DIRECTED UNTIL IT IS ALL FINISHED.  -If you HAVE RECEIVED OR BEEN PRESCRIBED A MEDICATION THAT MAY CAUSE DROWSINESS. DO NOT DRIVE, DRINK ALCOHOL, OR OPERATE MACHINERY THAT REQUIRES YOU TO BE ALERT.    -If you had an EKG and/or X-Ray reading made in the Emergency Department, it will be reviewed by a Cardiologist and/or Radiologist. If the review changes your diagnosis, you will be contacted.  -If you had a specimen collected for culture, a CULTURE REPORT takes 48-72 hours to generate: You will be contacted if a change in treatment is needed.    -Return if your condition worsens or if you have severe pain, worsening of symptoms such as fever, vomiting or difficulty breathing.  Migraine Headache  A migraine headache is an intense, throbbing pain on one or both sides of your head. The exact cause of a migraine headache is not always known. A migraine may be caused when nerves in the brain become irritated and release chemicals that cause swelling (inflammation) within blood vessels, causing pain. Many migraine sufferers have a family history of migraines. Before you get a migraine you may or may not get an aura. An aura is a group of symptoms that can predict the beginning of a migraine. An aura may include:   Visual changes such as:    Flashing lights.    Seeing bright spots or zig-zag lines.    Tunnel vision.    Feelings of numbness.    Trouble talking.     Muscle weakness.   SYMPTOMS OF A MIGRAINE   A migraine headache has one or more of the following symptoms:   Pain on one or both sides of your head.    Pain that is pulsating or throbbing in nature.    Pain that is severe enough to prevent daily activities.    Pain that is aggravated by any daily physical activity.    Nausea (feeling sick to your stomach), vomiting or both.    Pain with exposure to bright lights, loud noises or activity.    General sensitivity to bright lights or loud noises.   MIGRAINE TRIGGERS  A migraine headache can be triggered by many things. Examples of triggers include:    Alcohol.    Smoking.    Stress.    It may be related to menses (female menstruation).    Aged cheeses.    Foods or drinks that contain nitrates, glutamate, aspartame or tyramine.    Lack of sleep.    Chocolate.    Caffeine.    Hunger.    Medications such as nitroglycerine (used to treat chest pain), birth control pills, estrogen and some blood pressure medications.   DIAGNOSIS   A migraine headache is often diagnosed based on:    Your symptoms.    Physical examination.    A CT scan of your head may be ordered  to see if your headaches are caused from other medical conditions.   HOME CARE INSTRUCTIONS   Medications can help prevent migraines if they are recurrent or should they become recurrent. Your caregiver can help you with a medication or treatment program that will be helpful to you.    If you get a migraine, it may be helpful to lie down in a dark, quiet room.    It may be helpful to keep a headache diary. This may help you find a trend as to what may be triggering your headaches.   SEEK IMMEDIATE MEDICAL CARE IF:    You do not get relief from the medications given to you or you have a recurrence of pain.    You have confusion, personality changes or seizures.    You have headaches that wake you from sleep.    You have an increased frequency in your headaches.    You have a stiff  neck.    You have a loss of vision.    You have muscle weakness.    You start losing your balance or have trouble walking.    You feel faint or pass out.   MAKE SURE YOU:    Understand these instructions.    Will watch your condition.    Will get help right away if you are not doing well or get worse.   Document Released: 12/02/2005 Document Re-Released: 09/29/2009  [facility] Patient Information 2012 [place].

## 2011-06-17 MED ORDER — HYDROMORPHONE HCL 2 MG/ML IJ SOLN
2 MG/ML | Freq: Once | INTRAMUSCULAR | Status: AC
Start: 2011-06-17 — End: 2011-06-16
  Administered 2011-06-17: 02:00:00 via INTRAMUSCULAR

## 2011-06-17 MED ORDER — ONDANSETRON HCL 4 MG/2ML IJ SOLN
4 MG/2ML | Freq: Once | INTRAMUSCULAR | Status: AC
Start: 2011-06-17 — End: 2011-06-16
  Administered 2011-06-17: 02:00:00 via INTRAMUSCULAR

## 2011-06-17 MED FILL — HYDROMORPHONE HCL 2 MG/ML IJ SOLN: 2 MG/ML | INTRAMUSCULAR | Qty: 1

## 2011-06-17 MED FILL — ONDANSETRON HCL 4 MG/2ML IJ SOLN: 4 MG/2ML | INTRAMUSCULAR | Qty: 2

## 2012-05-31 ENCOUNTER — Inpatient Hospital Stay
Admit: 2012-06-01 | Disposition: A | Discharge status: Home / Self Care | Source: Home / Self Care | Admitting: Internal Medicine

## 2012-05-31 NOTE — ED Notes (Signed)
Pt currently rates CP a 4/10 after second SL nitro    Waunita Schooner, RN  05/31/12 2120

## 2012-05-31 NOTE — ED Notes (Signed)
Pt sts past week she has been having issues with migraines. Was given robaxin. Tried to take that this am for pain, but it did not help.    Ronn Melena, RN  05/31/12 1945

## 2012-05-31 NOTE — ED Notes (Signed)
After first SL nitro pt currently rates CP 5/10    Waunita Schooner, RN  05/31/12 2111

## 2012-05-31 NOTE — ED Provider Notes (Signed)
HPI Comments: PT C/O LT sided chest pain onset at 9:00 this AM after awaking, gradually worsening and radiating through back, up jaw, and down LT arm. Pain wax and wanes but is persistent. PT describes pain as a muscle pain. +nausea - resolved. No emesis or diarrhea. No SOB. No diaphoresis. No lightheadedness or dizziness. Movement does not exacerbate pain. No relieving factors. PT took flexeril for symptoms without relief. H/O asthma, pleurisy, GERD, PE, hypothyroidism, Protein S deficiency, and fibromyalgia. No H/O similar pain. PT is RT handed - no recent strenuous activity. Non-smoker. Cardiologist is not local. Previous stress test - PT was unable to complete.       8:32 PM  Pain has improved and no longer radiates to jaw or arm.     Patient is a 41 y.o. female presenting with chest pain. The history is provided by the spouse and the patient.   Chest Pain  The chest pain began 6 - 12 hours ago. Chest pain occurs constantly (wax and wanes). The chest pain is worsening. At its most intense, the chest pain is at 7/10. The chest pain is currently at 5/10. The quality of the pain is described as aching and dull. The pain radiates to the left jaw, left shoulder and upper back (no longer radiating). Primary symptoms include nausea. Pertinent negatives for primary symptoms include no fever, no shortness of breath, no palpitations, no abdominal pain, no vomiting and no dizziness.   Nausea began today.   Pertinent negatives for associated symptoms include no diaphoresis, no numbness and no weakness. Treatments tried: flexeril.   Her past medical history is significant for diabetes, PE and thyroid problem.         Review of Systems   Constitutional: Negative for fever and diaphoresis.   HENT: Positive for neck pain. Negative for trouble swallowing.    Respiratory: Negative for shortness of breath.    Cardiovascular: Positive for chest pain. Negative for palpitations.   Gastrointestinal: Positive for nausea. Negative for  vomiting and abdominal pain.   Musculoskeletal: Positive for back pain.   Skin: Negative for rash and wound.   Neurological: Negative for dizziness, weakness and numbness.   Psychiatric/Behavioral: Negative for confusion.   All other systems reviewed and are negative.            PAST MEDICAL HISTORY   has a past medical history of Fibromyalgia; Asthma; Pleurisy; GERD (gastroesophageal reflux disease); Protein S deficiency; Hypothyroidism; Pulmonary embolism; and Migraine.    PAST SURGICAL HISTORY   has past surgical history that includes pelvic laparoscopy.      SOCIAL HISTORY   reports that she has never smoked. She does not have any smokeless tobacco history on file. She reports that she does not drink alcohol or use illicit drugs.    HOME MEDICATIONS     Prior to Admission medications    Medication Sig Start Date End Date Taking? Authorizing Provider   aspirin EC 81 MG EC tablet Take 81 mg by mouth daily.      Historical Provider, MD   warfarin (COUMADIN) 1 MG tablet Take 4 mg by mouth Daily.      Historical Provider, MD   Sumatriptan-Naproxen Sodium (TREXIMET) 85-500 MG TABS tablet Take 1 tablet by mouth once.      Historical Provider, MD   aspirin-acetaminophen-caffeine (EXCEDRIN MIGRAINE) [phone] MG per tablet Take 1 tablet by mouth every 6 hours as needed.      Historical Provider, MD   acetaminophen (TYLENOL) 500  MG tablet Take 1,000 mg by mouth every 6 hours as needed.      Historical Provider, MD   propranolol (INDERAL) 20 MG tablet Take 40 mg by mouth nightly.    Historical Provider, MD   FA-Pyridoxine-Cyancobalamin (FOLBIC PO) Take  by mouth.      Historical Provider, MD   vitamin D (ERGOCALCIFEROL) 50000 UNIT CAPS capsule Take 50,000 Units by mouth once a week.      Historical Provider, MD   levothyroxine (SYNTHROID) 25 MCG tablet Take 50 mcg by mouth daily.      Historical Provider, MD   tramadol (ULTRAM) 50 MG tablet Take 50 mg by mouth every 6 hours as needed.      Historical Provider, MD    lansoprazole (PREVACID) 30 MG capsule Take 30 mg by mouth daily.      Historical Provider, MD        ALLERGIES  is allergic to phenergan; codeine; percocet; and vicodin.     Physical Exam   Nursing note and vitals reviewed.  Constitutional: She is oriented to person, place, and time. She appears well-developed and well-nourished.   HENT:   Head: Normocephalic and atraumatic.   Mouth/Throat: Oropharynx is clear and moist.   Eyes: Conjunctivae and EOM are normal. Pupils are equal, round, and reactive to light.   Neck: Normal range of motion. Neck supple.   Cardiovascular: Normal rate, regular rhythm, normal heart sounds and intact distal pulses.    Pulmonary/Chest: Effort normal and breath sounds normal. No accessory muscle usage. No respiratory distress. She exhibits tenderness.   Abdominal: Soft. Bowel sounds are normal.   Musculoskeletal: Normal range of motion.   Neurological: She is alert and oriented to person, place, and time.   Skin: Skin is warm and dry.   Psychiatric: She has a normal mood and affect. Her behavior is normal.   positive reproducible chest wall and shoulder tenderness    Procedures    MDM  Number of Diagnoses or Management Options  Chest pain:      Amount and/or Complexity of Data Reviewed  Clinical lab tests: ordered and reviewed  Tests in the radiology section of CPT: ordered and reviewed  Tests in the medicine section of CPT: reviewed and ordered  Obtain history from someone other than the patient: yes (husband)  Discuss the patient with other providers: yes (Hospitalist)  Independent visualization of images, tracings, or specimens: yes        Labs      Radiology: Preliminary x-ray interpretation by Titus Drone Lowell Bouton, MD   independently, in absence of radiologist (Final interpretation by radiologist to follow):    Chest: no infiltrate. Normal heart size.        EKG Interpretation.EKG interpreted by Sajjad Honea Lowell Bouton, MD:    Rhythm: normal sinus   Rate: 67  Axis: normal  Ectopy:  none  Conduction: normal  ST Segments: normal  T Waves: normal  Q Waves: none    11:07 PM  Discussed results.  PT would like to contemplate her decision with her husband before making a final decision - will give ample time for PT and husband to discuss decision.     11:53 PM  Re-evaluated PT. She states she is still in pain, very fearful and would like to be admitted to the hospital.   Patient is not convinced that this is a muscle skeletal pain and would like further evaluation.    11:58 PM  Spoke with Hospitalist, discussed case and  results.     All entries by Irena Cords are made while acting as a scribe for Cyerra Yim Lowell Bouton, MD.  Scribe Authentication: All medical record entries made by the scribe were at my direction. I have reviewed the chart and agree that the record accurately reflects the my work and the decisions made by me, Linus Galas M.D.      Jackson Latino, MD  06/01/12 [phone]

## 2012-05-31 NOTE — ED Notes (Signed)
Patient assisted to the restroom, ambulated with no difficulty.    Jasmine Strickland  05/31/12 2040

## 2012-05-31 NOTE — ED Notes (Signed)
Pt sees cardiology for protein S def and hx of PE. Is on coumadin and propranolol. Sts father has had prior MI, issues started in late 20's. Sts mother's side hx of stroke.     Ronn Melena, RN  05/31/12 1939

## 2012-06-01 LAB — COMPREHENSIVE METABOLIC PANEL
ALT: 16 U/L (ref 10–40)
AST: 16 U/L (ref 15–37)
Albumin/Globulin Ratio: 1.6 (ref 1.1–2.2)
Albumin: 4.1 g/dL (ref 3.4–5.0)
Alkaline Phosphatase: 67 U/L (ref 45–129)
BUN: 8 mg/dL (ref 7–18)
CO2: 27 mEq/L (ref 21–32)
Calcium: 9.1 mg/dL (ref 8.3–10.6)
Chloride: 107 mEq/L (ref 99–110)
Creatinine: 1.1 mg/dL (ref 0.6–1.1)
GFR African American: >60 (ref >60)
GFR Non-African American: 59 — AB (ref >60)
Globulin: 2 g/dL
Glucose: 95 mg/dL (ref 70–99)
Potassium: 3.7 mEq/L (ref 3.5–5.1)
Sodium: 141 mEq/L (ref 136–145)
Total Bilirubin: 0.4 mg/dL (ref 0.0–1.0)
Total Protein: 6.6 g/dL (ref 6.4–8.2)

## 2012-06-01 LAB — POCT GLUCOSE
Glucose: 101 mg/dl — ABNORMAL HIGH (ref 70–99)
Glucose: 125 mg/dl — ABNORMAL HIGH (ref 70–99)
Glucose: 95 mg/dl (ref 70–99)

## 2012-06-01 LAB — URINALYSIS
Blood, Urine: NEGATIVE
Glucose, Ur: NEGATIVE mg/dL
Leukocyte Esterase, Urine: NEGATIVE
Nitrite, Urine: NEGATIVE
Protein, UA: NEGATIVE mg/dL
Specific Gravity, UA: >=1.03 (ref 1.005–1.030)
Urobilinogen, Urine: 0.2 E.U./dL (ref <2.0)
pH, UA: 5.5 (ref 5.0–8.0)

## 2012-06-01 LAB — CBC WITH AUTO DIFFERENTIAL
Basophils %: 0.4 %
Basophils Absolute: 0 10*3/uL (ref 0.0–0.2)
Eosinophils %: 1.8 %
Eosinophils Absolute: 0.1 10*3/uL (ref 0.0–0.6)
Hematocrit: 38.8 % (ref 36.0–48.0)
Hemoglobin: 13 g/dL (ref 12.0–16.0)
Lymphocytes %: 31.1 %
Lymphocytes Absolute: 1.5 10*3/uL (ref 1.0–5.1)
MCH: 30.4 pg (ref 26.0–34.0)
MCHC: 33.5 g/dL (ref 31.0–36.0)
MCV: 90.6 fL (ref 80.0–100.0)
MPV: 9.1 fL (ref 5.0–10.5)
Monocytes %: 7.4 %
Monocytes Absolute: 0.4 10*3/uL (ref 0.0–1.3)
Neutrophils %: 59.3 %
Neutrophils Absolute: 2.9 10*3/uL (ref 1.7–7.7)
Platelets: 268 10*3/uL (ref 135–450)
RBC: 4.28 M/uL (ref 4.00–5.20)
RDW: 14.9 % (ref 12.4–15.4)
WBC: 4.9 10*3/uL (ref 4.0–11.0)

## 2012-06-01 LAB — APTT: aPTT: 42.4 s — ABNORMAL HIGH (ref 23.1–35.5)

## 2012-06-01 LAB — PROTIME-INR
INR: 2.69 — ABNORMAL HIGH (ref 0.85–1.15)
INR: 2.77 — ABNORMAL HIGH (ref 0.85–1.15)
Protime: 29 s — ABNORMAL HIGH (ref 10.0–12.8)
Protime: 30.1 s — ABNORMAL HIGH (ref 10.0–12.8)

## 2012-06-01 LAB — TROPONIN
Troponin I: <0.006 ng/mL (ref 0.000–0.040)
Troponin I: <0.006 ng/mL (ref 0.000–0.040)
Troponin I: <0.006 ng/mL (ref 0.000–0.040)

## 2012-06-01 LAB — PREGNANCY, URINE: HCG(Urine) Pregnancy Test: NEGATIVE

## 2012-06-01 LAB — D-DIMER, QUANTITATIVE: D-Dimer, Quant: <200 ng/mL DDU (ref 0–229)

## 2012-06-01 MED ORDER — OXYCODONE-ACETAMINOPHEN 5-325 MG PO TABS
5-325 MG | Freq: Once | ORAL | Status: AC
Start: 2012-06-01 — End: 2012-05-31
  Administered 2012-06-01: 02:00:00 via ORAL

## 2012-06-01 MED ORDER — ONDANSETRON HCL 4 MG/2ML IJ SOLN
4 MG/2ML | Freq: Once | INTRAMUSCULAR | Status: AC
Start: 2012-06-01 — End: 2012-05-31
  Administered 2012-06-01: 01:00:00 via INTRAVENOUS

## 2012-06-01 MED ORDER — METHOCARBAMOL 500 MG PO TABS
500 MG | Freq: Three times a day (TID) | ORAL | Status: DC
Start: 2012-06-01 — End: 2012-06-01
  Administered 2012-06-01: 16:00:00 via ORAL

## 2012-06-01 MED ORDER — ASPIRIN 81 MG PO TBEC
81 MG | Freq: Every day | ORAL | Status: DC
Start: 2012-06-01 — End: 2012-06-01

## 2012-06-01 MED ORDER — NITROGLYCERIN 0.4 MG SL SUBL
0.4 MG | Freq: Once | SUBLINGUAL | Status: AC
Start: 2012-06-01 — End: 2012-05-31
  Administered 2012-06-01: 01:00:00 via SUBLINGUAL

## 2012-06-01 MED ORDER — WARFARIN SODIUM 5 MG PO TABS
5 MG | Freq: Every day | ORAL | Status: DC
Start: 2012-06-01 — End: 2012-06-01
  Administered 2012-06-01 (×2): via ORAL

## 2012-06-01 MED ORDER — HYDROMORPHONE HCL 1 MG/ML IJ SOLN
1 MG/ML | Freq: Once | INTRAMUSCULAR | Status: AC
Start: 2012-06-01 — End: 2012-05-31
  Administered 2012-06-01: 03:00:00 via INTRAVENOUS

## 2012-06-01 MED ORDER — MORPHINE SULFATE 5 MG/ML IJ SOLN
5 MG/ML | Freq: Once | INTRAMUSCULAR | Status: AC
Start: 2012-06-01 — End: 2012-05-31
  Administered 2012-06-01: 01:00:00 via INTRAVENOUS

## 2012-06-01 MED ORDER — METFORMIN HCL 500 MG PO TABS
500 MG | Freq: Two times a day (BID) | ORAL | Status: DC
Start: 2012-06-01 — End: 2012-06-01
  Administered 2012-06-01 (×2): via ORAL

## 2012-06-01 MED ADMIN — propranolol (INDERAL) tablet 40 mg: ORAL | @ 06:00:00 | NDC 00904041161

## 2012-06-01 MED ADMIN — sodium chloride 0.9 % injection 10 mL: INTRAVENOUS | @ 16:00:00

## 2012-06-01 MED ADMIN — levothyroxine (SYNTHROID) tablet 50 mcg: ORAL | @ 16:00:00 | NDC 00074455211

## 2012-06-01 MED ADMIN — technetium tetrofosmin (Tc-MYOVIEW) injection 25 milli Curie: INTRAVENOUS | @ 21:00:00 | NDC 17156002405

## 2012-06-01 MED ADMIN — technetium tetrofosmin (Tc-MYOVIEW) injection 25 milli Curie: INTRAVENOUS | @ 19:00:00 | NDC 17156002405

## 2012-06-01 MED ADMIN — aspirin chewable tablet 324 mg: ORAL | @ 16:00:00 | NDC 63739043401

## 2012-06-01 MED ADMIN — technetium tetrofosmin (Tc-MYOVIEW) injection 10 milli Curie: INTRAVENOUS | @ 15:00:00 | NDC 17156002405

## 2012-06-01 MED ADMIN — pantoprazole (PROTONIX) tablet 40 mg: ORAL | @ 16:00:00 | NDC 00904623561

## 2012-06-01 MED ADMIN — traMADol (ULTRAM) tablet 50 mg: 50 mg | ORAL | @ 06:00:00 | NDC 51079099101

## 2012-06-01 MED FILL — MORPHINE SULFATE 5 MG/ML IJ SOLN: 5 mg/mL | INTRAMUSCULAR | Qty: 1

## 2012-06-01 MED FILL — ROXICET 5-325 MG PO TABS: 5-325 MG | ORAL | Qty: 1

## 2012-06-01 MED FILL — METHOCARBAMOL 500 MG PO TABS: 500 MG | ORAL | Qty: 1

## 2012-06-01 MED FILL — COUMADIN 5 MG PO TABS: 5 MG | ORAL | Qty: 1

## 2012-06-01 MED FILL — HYDROMORPHONE HCL PF 1 MG/ML IJ SOLN: 1 MG/ML | INTRAMUSCULAR | Qty: 1

## 2012-06-01 MED FILL — NITROSTAT 0.4 MG SL SUBL: 0.4 MG | SUBLINGUAL | Qty: 25

## 2012-06-01 MED FILL — METFORMIN HCL 500 MG PO TABS: 500 MG | ORAL | Qty: 1

## 2012-06-01 MED FILL — ONDANSETRON HCL 4 MG/2ML IJ SOLN: 4 MG/2ML | INTRAMUSCULAR | Qty: 2

## 2012-06-01 MED FILL — NORMAL SALINE FLUSH 0.9 % IV SOLN: 0.9 % | INTRAVENOUS | Qty: 10

## 2012-06-01 MED FILL — PROTONIX 40 MG PO TBEC: 40 MG | ORAL | Qty: 1

## 2012-06-01 MED FILL — ULTRAM 50 MG PO TABS: 50 MG | ORAL | Qty: 1

## 2012-06-01 MED FILL — SODIUM CHLORIDE 0.9 % IV SOLN: 0.9 % | INTRAVENOUS | Qty: 250

## 2012-06-01 MED FILL — PROPRANOLOL HCL 10 MG PO TABS: 10 MG | ORAL | Qty: 4

## 2012-06-01 MED FILL — SYNTHROID 50 MCG PO TABS: 50 MCG | ORAL | Qty: 1

## 2012-06-01 MED FILL — ASPIRIN 81 MG PO CHEW: 81 MG | ORAL | Qty: 4

## 2012-06-01 NOTE — Consults (Addendum)
[facility]   CONSULTATION  [phone]        Reason for Consultation/Chief Complaint: Chest pain  History of Present Illness:  Jasmine Strickland is a 41 y.o. patient with PMH Protein S defiency, PE remains on coumadin,(Dr Lollie Sails Driedger cardiologist)  GERD, fibromyalgia, DM, hypothyroidism, iron defiency no known prior CAD who presented to the hospital with complaints of chest pain. She states she awoke from sleep and noted left-sided sharp chest pain that radiated through to her back, up to her left shoulder, jaw, neck and ear and down her left arm.  She had associated nausea and left hand numbness.  Nothing made her pain better or worse at home.  Her pain lasted several hours throughout the day and was off and on prior to arrival in the ED.  She was given 3 SL nitro and IV morphine with no relief of her pain.  IV dilaudid did help her pain. Her troponins have been negative x 2, EKG showed NSR.   Today she reports to feeling better and denies any complaints.  She reports to recently being diagnosed with cholelithiasis and will need to under go cholecystectomy in the near future.  She also reports recent bout with fatigue blood drawn showed low iron and ferritin levels underwent IV iron infusion and fatigue is resolving. 2 ER visits last MON and Tues for severe Migraine  HA I have been asked to provide consultation regarding further management and testing. She said she feels "cramping" in shoulder blade left-sided this morning which feels similar to prior chest pain.  Note she reports normal stress test 3 years ago in [place].  I do not have formal report at this time.       Past Medical History:   has a past medical history of Fibromyalgia; Asthma; Pleurisy; GERD (gastroesophageal reflux disease); Protein S deficiency; Hypothyroidism; Pulmonary embolism; Migraine; and Diabetes mellitus.    Surgical History:   has past surgical history that includes pelvic laparoscopy.     Social History:   She  is married and live with her spouse and 8 children in [place], [place].  Her children range in age from 65-20.  She works full time at [employer] and attends college full time.  She reports that she has never smoked. She has never used smokeless tobacco. She reports that she does not drink alcohol or use illicit drugs.     Family History:  Dad + MI age 97, 67 and 2 MI age 29    Home Medications:  Were reviewed and are listed in nursing record. and/or listed below  Prior to Admission medications    Medication Sig Start Date End Date Taking? Authorizing Provider   SUMAtriptan (IMITREX) 100 MG tablet Take 100 mg by mouth once as needed.   Yes Historical Provider, MD   metformin (GLUCOPHAGE) 500 MG tablet Take 500 mg by mouth 2 times daily (with meals).   Yes Historical Provider, MD   methocarbamol (ROBAXIN) 500 MG tablet Take 500 mg by mouth 3 times daily as needed.   Yes Historical Provider, MD   aspirin EC 81 MG EC tablet Take 81 mg by mouth daily.     Yes Historical Provider, MD   warfarin (COUMADIN) 1 MG tablet Take 5 mg by mouth Daily. Pt takes 5 mg every day except Wed and Sun she takes 7.5 mg.   Yes Historical Provider, MD   propranolol (INDERAL) 20 MG tablet Take 40 mg by mouth nightly.  Yes Historical Provider, MD   FA-Pyridoxine-Cyancobalamin (FOLBIC PO) Take 1 tablet by mouth daily.   Yes Historical Provider, MD   levothyroxine (SYNTHROID) 25 MCG tablet Take 50 mcg by mouth daily.     Yes Historical Provider, MD   lansoprazole (PREVACID) 30 MG capsule Take 30 mg by mouth daily.     Yes Historical Provider, MD        Allergies:  Phenergan; Gluten meal; Orange; Tomato; Codeine; Percocet; and Vicodin     Review of Systems:   All 12 point review of symptoms completed. Pertinent positives identified in the HPI, all other review of symptoms negative as below.    ??     Physical Examination:    Filed Vitals:    06/01/12 0514   BP: 98/67   Pulse: 72   Temp: 97.1 ??F (36.2 ??C)   Resp: 14    Weight: 184 lb 15.5  oz (83.9 kg) (bedscale)         General Appearance:  Alert, cooperative, no distress, appears stated age   Head:  Normocephalic, without obvious abnormality, atraumatic   Eyes:  PERRL, conjunctiva/corneas clear       Nose: Nares normal, no drainage or sinus tenderness   Throat: Lips, mucosa, and tongue normal   Neck: Supple, symmetrical, trachea midline, no adenopathy, thyroid: not enlarged, symmetric, no tenderness/mass/nodules, no carotid bruit or JVD       Lungs:   Clear to auscultation bilaterally, respirations unlabored   Chest Wall:  No tenderness or deformity;  +tenderness to palpation upper left back similar to pain prior   Heart:  Regular rate and rhythm, S1, S2 normal, no murmur, rub or gallop   Abdomen:   Soft, non-tender, bowel sounds active all four quadrants,  no masses, no organomegaly           Extremities: Extremities normal, atraumatic, no cyanosis or edema   Pulses: 2+ and symmetric   Skin: Skin color, texture, turgor normal, no rashes or lesions   Pysch: Normal mood and affect   Neurologic: Normal gross motor and sensory exam.         Labs  CBC:   Lab Results   Component Value Date    WBC 4.9 05/31/2012    RBC 4.28 05/31/2012    HGB 13.0 05/31/2012    HCT 38.8 05/31/2012    MCV 90.6 05/31/2012    RDW 14.9 05/31/2012    PLT 268 05/31/2012     CMP:    Lab Results   Component Value Date    NA 141 05/31/2012    K 3.7 05/31/2012    CL 107 05/31/2012    CO2 27 05/31/2012    BUN 8 05/31/2012    CREATININE 1.1 05/31/2012    GFRAA >60 05/31/2012    GFRAA >60 12/28/2010    AGRATIO 1.6 05/31/2012    LABGLOM 59 05/31/2012    GLUCOSE 95 05/31/2012    PROT 6.6 05/31/2012    PROT 7.3 12/28/2010    CALCIUM 9.1 05/31/2012    BILITOT 0.4 05/31/2012    ALKPHOS 67 05/31/2012    AST 16 05/31/2012    ALT 16 05/31/2012     PT/INR:    No components found with this basename: PTPATIENT, PTINR     Lab Results   Component Value Date    TROPONINI <0.006 06/01/2012       EKG: NSR    Assessment:  41yo female with atypical chest pain symptoms.  Her pain  lasted all day off and on and was non-exertional and not relieved with nitro or morphine.  She had pain upper back on palpation similar to symptoms yesterday.  Her EKG is normal and she has ruled out for MI.  She does have risk factors for CAD including DM, +FH, and protein S deficiency.      Rec:  1. I agree with nuclear stress study to evaluate myocardial perfusion.  Would perform GXT myoview.  If negative and no concerning findings then she would be OK for d/c from cardiac standpoint and she can f/u with regular cardiologist in [place].  2. Would try ibuprofen 400mg  TID with meals for 1 week if stress negative for possible musculoskeletal pain.        Thank you for allowing to [place] to participate in the care or Jasmine Strickland. Further evaluation will be based upon the patient's clinical course and testing results.      Mayer Masker, MD

## 2012-06-01 NOTE — Progress Notes (Signed)
Education on cardiac risk factors, lifestyle modifications, tests tx,

## 2012-06-01 NOTE — Progress Notes (Signed)
Assessment completed and documented. VSS. A+O x4. Pt denies any CP, no s/s distress. Resp E/E, 96% on RA. FSBS 101, remains NPO. SR via tele. Husband at bedside. C/L in reach.   Filed Vitals:    06/01/12 0815   BP: 101/68   Pulse: 61   Temp: 97.1 F (36.2 C)   Resp: 14

## 2012-06-01 NOTE — Progress Notes (Signed)
Pt returned from stress, diet ordered. No c/o, VSS. C/L in reach.   Filed Vitals:    06/01/12 1151   BP: 110/71   Pulse: 75   Temp: 98 F (36.7 C)   Resp: 16

## 2012-06-01 NOTE — Progress Notes (Signed)
Spoke with Bonita Quin in outpatient and Ada in radiology, made them aware of stress test order for the am. BHavens PCA/MT 06/01/12@0630 

## 2012-06-01 NOTE — Progress Notes (Signed)
D/C'd right hand INT, site WNL. Reviewed d/c instructions/med rec with pt and husband, verbalized understanding. Pt declines wheelchair, d/c'd alongside husband with belongings, gait steady. Tele returned to RN station.

## 2012-06-01 NOTE — Discharge Summary (Signed)
See combined H&P and d/c summary

## 2012-06-01 NOTE — Procedures (Signed)
PATIENT NAME:                   PA #:             MR #Jasmine Strickland, Jasmine Strickland                [id number]        [id number]         ATTENDING PHYSICIAN:                    SERVICE DATE:  DIS DATE:       Kathlee Nations, MD           06/01/2012                     DATE OF BIRTH:     AGE:            PATIENT TYPE:      RM #:           Nov 02, 1971         41              [facility]                0315               ORDERING PHYSICIAN:  Dr. Nigel Berthold     INDICATIONS: Chest pain.     The patient walked on a standard Bruce protocol for a total exercise duration  of 7 minutes and 15 seconds.  Resting heart rate was 87 and increased to 162  which is above her target heart rate of 154.  Resting blood pressure is  106/69 and increased to 148/60.  The patient reported some chest tightness  before, during and after the test.  The reason the test was stopped was  secondary to achievement of target heart rate.     The patients baseline EKG was sinus rhythm, nonspecific ST-change.  The stress portion demonstrated less than 0.5 mm of ST-segment depression.   There was some artifact noted.  There was no evidence for significant  arrhythmia.       CONCLUSION:  Normal exercise EKG portion.  Perfusion imaging will be dictated  separately and later.                                            Sheryle Hail, MD     [id number]  DD: 06/01/2012 11:31   DT: 06/01/2012 14:01   Job #: [id number]  CC: Kathlee Nations, MD  CC: Sheryle Hail, MD  CC: Mirna Mires, MD

## 2012-06-01 NOTE — Progress Notes (Signed)
Resting with eyes closed. Resp e/e. No distress noted. Will monitor.

## 2012-06-01 NOTE — Progress Notes (Signed)
Patient readied for Myoview stress test. Consent obtained and procedure explained. Pt. verbalized understanding. No distress noted.

## 2012-06-01 NOTE — Discharge Instructions (Signed)
Taking Coumadin  Coumadin (warfarin) helps keep your blood from clotting. It is used to reduce the risk for stroke, heart attack, or a blood clot passing to your lung. Coumadin also increases your risk of bleeding. Because of this, it must be taken exactly as directed by your doctor. You also need to protect yourself from injury.    Follow These Tips  Take this medicine at the same time each day. Take it with a full glass of water, with or without food. If you miss a dose,contact your doctor immediately to find out how much to take. Never take a double dose.  Warfarin is an effective drug, but it can be dangerous if not taken properly. It makes your blood less likely to form clots. If you take too much, it can cause too much internal or external bleeding.  Be sure to tell all of your doctors that you take Coumadin. If you will be taking Coumadin for quite a while, carry an ID card or get a Medic-Alert bracelet. This will alert medical staff in case you aren't able to do so yourself. Also carry with you the name and number of the person to contact in case of an emergency.  You will need to have regular blood tests to measure the effects of the warfarin. Keep your scheduled test appointment and be sure to talk with your doctor afterward to find out your results. Your doctor may need to change your dose. Follow your doctor's advice exactly about how to take this medicine. Do not stop the medicine without talking with your doctor.    Monitoring Your PT/INR Blood Levels After Discharge  Two tests are used to find out how your blood is clotting. One is protime (PT) and the other is the international normalized ratio (INR).  Go for your blood (PT/INR) tests as often as directed. Note that diet and medication can affect your PT/INR level.  Your INR was between _____ and _____ .  Ask your doctor what your goal INR is. My goal INR is between _____ and _____.  My next PT/INR blood draw is due on _________________ (date) at  ________________ (time) by ____________________ (name of doctor or clinic).  The name of the doctor who is monitoring my anticoagulation therapy is ______________________ and the phone number is ___________________.  Follow up with your doctor or as advised by his or her staff. It usually takes a few hours for your doctor to get the results of your clotting tests. Please call to get your lab results and find out if your doctor needs to make further changes to your Coumadin dose.  If your labs (PT/INR) are drawn at a location other than your doctor's office, please remember to tell your doctor as soon as you get your lab results.     What to Do at Home  1. Adjust your Coumadin dose as directed by your doctor, ____________________ (name of doctor).  2. Have another clotting test done every _______ days at _____________________ (name of clinic) by ____________________ (name of doctor).  3. Do not go barefoot. Always wear shoes.  4. Do not trim corns or calluses yourself.  5. Always talk with your doctor before taking any herbs, vitamins, or prescription or over-the-counter (OTC) medications, especially aspirin and nonsteroidal anti-inflammatory drugs.  6. Always talk with your doctor before stopping any medication or changing the dose of any of your medications.  7. Use an electric razor instead of a manual one.  8. Use a   soft-bristled toothbrush and waxed floss.  9. Avoid major changes in your diet.       When to Call Your Health Care Provider  Coumadin increases your risk of bleeding. Call your health care provider right away before you take your next dose of Coumadin if you have any of these problems:  Bleeding that doesn't stop in 10 minutes  A heavier-than-normal period or bleeding between periods  Coughing or throwing up blood or something that looks like coffee grounds  Nausea, bloating, diarrhea, or bleeding hemorrhoids  Bleeding hemorrhoids  Dark red or brown urine  Red or black tarry stools  Red or  black-and-blue marks on the skin that get larger  A fever or an illness that gets worse  Dizziness, headache, weakness, or fatigue  Chest pain or trouble breathing  A serious fall or a blow to the head  Swelling or pain after an injury or at an injection site  Bleeding gums after brushing your teeth    Keep Your Diet Steady  Keep your diet pretty much the same each day. That's because many foods contain vitamin K. Vitamin K helps your blood clot. So eating foods that contain vitamin K can affect the way Coumadin works. You don't need to avoid foods that have vitamin K. But you do need to keep the amount of them you eat steady (about the same day to day). If you change your diet for any reason, such as due to illness or to lose weight, be sure to tell your doctor.  Examples of foods high in vitamin K are asparagus, avocado, broccoli, cabbage, kale, spinach, and some other leafy green vegetables. Oils, such as soybean, canola, and olive oils, are also high in vitamin K.  Other food products can affect the way Coumadin works in your body:  Food products that may affect blood clotting include cranberries and cranberry juice, fish oil supplements, garlic, ginger, licorice, and turmeric.  Herbs used in herbal teas or supplements can also affect blood clotting. Keep the amount of herbal teas and supplements you use steady.  Alcohol can increase the effect of Coumadin in your body.  Talk with your health care provider if you have concerns about these or other food products and their effects on Coumadin.    What to Watch For  If you have any of these signs or reactions, call your doctor right away or go to the hospital.  Signs of too much bleeding:  More bruising than normal  Prolonged bleeding from cuts  Bleeding from the nose or gums  Blood in your urine, vomit, or stools (red or black color)  Coughing up blood  Unusually heavy menstrual bleeding  Sudden change to a dark or purplish color in your toes or any other area of  your body  Allergic Reactions:  Rash  Itching  Swelling  Trouble swallowing or breathing    !!!!!!! IMPORTANT!!!!!!!  Medical Conditions  Before starting this medicine, be sure your doctor knows if you have any of these conditions:  Stomach ulcer now or in the past  Vomited blood or had bloody stools (black or red color)  Aneurysm, pericarditis, or pericardial effusion  Blood disorder  Recent surgery, stroke, mini-stroke, or spinal puncture  Kidney or liver disease, uncontrolled high blood pressure, diabetes, vasculitis, congestive heart failure, lupus or other collagen-vascular disease, or high cholesterol  Pregnancy or breastfeeding  Younger than 39 years old  Recent or planned dental procedure    Drug Interactions  Many   medicines interfere with the effect of Coumadin. Before starting this medicine, be sure your doctor knows about any prescription, OTC, or herbal drugs you are taking. This is especially true if you are taking:  Antibiotics  Heart medicines  Cimetidine (Tagamet)  Aspirin or other anti-inflammatory drugs such as ibuprofen (Advil, Motrin, or Nuprin), naproxen (Aleve, Naprosyn, Anaprox), ketoprofen (Orudis, Oruvail), or other arthritis medicines  Drugs for depression, cancer, HIV (protease inhibitors), diabetes, seizures, gout, high cholesterol, or thyroid replacement  Vitamins containing Vitamin K or herbal products such as ginkgo, Q10, garlic, or St. John's wort    [NOTE: This information topic may not include all directions, precautions, medical conditions, drug/food interactions, and warnings for this drug. Check with your doctor, nurse, or pharmacist for any questions that you may have.]    2000-2012 [employer], [address], [place] [address]. All rights reserved. This information is not intended as a substitute for professional medical care. Always follow your healthcare professional's instructions.

## 2012-06-01 NOTE — H&P (Signed)
Combined History and Physical and d/c summary        HISTORY OF PRESENT ILLNESS: woke up with chest pain yesterday. Radiated from mid chest to the left shoulder,left side of neck and left arm. Not related to exertion.no shortness of breath.no cough,fever.  Not relieved by s/l nitro.    Patient is allergic to phenergan; gluten meal; orange; tomato; codeine; percocet; and vicodin.    Past Medical History   Diagnosis Date   ??? Fibromyalgia    ??? Asthma    ??? Pleurisy      multiple episodes "several yrs ago"   ??? GERD (gastroesophageal reflux disease)    ??? Protein S deficiency    ??? Hypothyroidism    ??? Pulmonary embolism    ??? Migraine    ??? Diabetes mellitus        Past Surgical History   Procedure Laterality Date   ??? Pelvic laparoscopy         Scheduled Meds:       ??? aspirin EC  81 mg Oral Daily   ??? folic acid-pyridoxine-cyancobalamin  1 tablet Oral Daily   ??? pantoprazole  40 mg Oral QAM AC   ??? levothyroxine  50 mcg Oral Daily   ??? metformin  500 mg Oral BID WC   ??? methocarbamol  500 mg Oral TID   ??? propranolol  40 mg Oral Nightly   ??? warfarin  5 mg Oral Daily   ??? sodium chloride  10 mL Intravenous Q12H [facility]   ??? aspirin  324 mg Oral Daily       Continuous Infusions:       PRN Meds:  acetaminophen, traMADol, sodium chloride, acetaminophen, magnesium hydroxide, ondansetron, technetium tetrofosmin       reports that she has never smoked. She has never used smokeless tobacco.    History reviewed. No pertinent family history.    History     Social History   ??? Marital Status: Married     Spouse Name: N/A     Number of Children: N/A   ??? Years of Education: N/A     Social History Main Topics   ??? Smoking status: Never Smoker    ??? Smokeless tobacco: Never Used   ??? Alcohol Use: No   ??? Drug Use: No   ??? Sexually Active: Yes -- Female partner(s)     Other Topics Concern   ??? None     Social History Narrative   ??? None     REVIEW OF SYSTEMS:   Constitutional: Negative for fever   HENT: Negative for sore throat   Eyes: Negative for redness    Respiratory: Negative for dyspnea, cough   Cardiovascular: + for chest pain   Gastrointestinal: Negative for vomiting, diarrhea   Genitourinary: Negative for hematuria   Musculoskeletal: Negative for arthralgias       Filed Vitals:    06/01/12 1013   BP: 120/71   Pulse: 76   Temp: 98.3 ??F (36.8 ??C)   Resp: 15     Gen: No distress. Alert.   Eyes: PERRL. No sclera icterus. No conjunctival injection.   ENT: No discharge. Pharynx clear.   Neck: Trachea midline. Normal thyroid.   Resp: No accessory muscle use. No crackles. No wheezes. No rhonchi. No dullness on percussion.  CV: Regular rate. Regular rhythm. No murmur or rub. No edema. Tenderness left second costochondral junction.  GI: Non-tender. Non-distended. No masses. No organomegaly. Normal bowel sounds. No hernia.   Skin:  Warm and dry. No nodule on exposed extremities. No rash on exposed extremities.   Lymph: No cervical LAD. No supraclavicular LAD.   M/S: No cyanosis. No joint deformity. No clubbing.   Neuro: Awake.   Psych: Oriented x 3.     CBC:   Recent Labs      05/31/12   1950   WBC  4.9   HGB  13.0   HCT  38.8   MCV  90.6   PLT  268     BMP:   Recent Labs      05/31/12   1950   NA  141   K  3.7   CL  107   CO2  27   BUN  8   CREATININE  1.1     LIVER PROFILE:   Recent Labs      05/31/12   1950   AST  16   ALT  16   BILITOT  0.4   ALKPHOS  67     PT/INR:   Recent Labs      05/31/12   1950  06/01/12   0240   PROTIME  29.0*  30.1*   INR  2.69*  2.77*     APTT:   Recent Labs      05/31/12   1950   APTT  42.4*     UA:  Recent Labs      06/01/12   0110   COLORU  Yellow   PHUR  5.5   CLARITYU  Clear   SPECGRAV  >=1.030   LEUKOCYTESUR  Negative   UROBILINOGEN  0.2   BILIRUBINUR  SMALL*   BLOODU  Negative   GLUCOSEU  Negative       Chest imaging was reviewed by me normal.    ASSESSMENT:  Patient Active Problem List    Diagnosis Date Noted   ??? Chest pain 06/01/2012     Priority: High         PLAN:  1. Chest pain- atypical.EKG- normal .Troponins negative.ASA. Card  consult. GXT myoview.  2.H/o PE- INR therapeutic.  3.Migraines- Continue inderal.  4.DM- continue metformin and SSI.    GXT myoview negative.  Dx- Atypical chest pain.   D/c home.          [facility] 06/01/2012 11:43 AM

## 2012-06-01 NOTE — Progress Notes (Signed)
Patient completed Myoview stress test. Reported chest tightness during test at 4/10 that decreased to 3/10 after exercise. Report called to pt nurse Inetta Fermo and aware pt to limit caffeine to one cup and will have resting scan later this afternoon. Patient in good condition and resp easy.

## 2012-06-01 NOTE — ED Notes (Signed)
[id number]    Louis Meckel, RN  06/01/12 0100

## 2012-06-01 NOTE — Progress Notes (Signed)
Patient admitted to PCU. H&P completed. Plan of care reviewed with patient. Oriented to room and unit. See admission assessment.Jasmine Strickland Jasmine Strickland and Jasmine Strickland performed complete skin assessment on admission.  Assessment revealed no skin problems. SR per tele 60. Rating cp at present 3/10. Tramadol 50 mg po given. Will monitor.

## 2012-06-02 LAB — EKG 12-LEAD
Atrial Rate: 67 {beats}/min
P Axis: 22 degrees
P-R Interval: 146 ms
Q-T Interval: 384 ms
QRS Duration: 82 ms
QTc Calculation (Bazett): 405 ms
R Axis: 38 degrees
T Axis: 32 degrees
Ventricular Rate: 67 {beats}/min

## 2012-12-15 NOTE — Progress Notes (Signed)
Attempted to contact patient.  No answer.  No message left at 2:50 PM on 12/15/2012.

## 2012-12-17 NOTE — Progress Notes (Signed)
WT:  170 lb  HT:  27f2.5in

## 2012-12-17 NOTE — Patient Instructions (Signed)
PRE OP INSTRUCTION SHEET   1. Do not eat or drink anything after 12 midnight  prior to surgery. This includes no water, chewing gum or mints.   2. Take the following pills will a small sip of water (see MAR)                                        3. Aspirin, Ibuprofen, Advil, Naproxen, Vitamin E, fish oil and other Anti-inflammatory products should be stopped for 5 days before surgery or as directed by your physician.   4. Check with your Doctor regarding stopping Plavix, Coumadin, Lovenox, Fragmin or other blood thinners   5. Do not smoke, and do not drink any alcoholic beverages 24 hours prior to surgery.  This includes NA Beer.   6. You may brush your teeth and gargle the morning of surgery.  DO NOT SWALLOW WATER   7. You MUST make arrangements for a responsible adult to take you home after your surgery. You will not be allowed to leave alone or drive yourself home.  It is strongly suggested someone stay with you the first 24 hrs. Your surgery will be cancelled if you do not have a ride home.   8. A parent/legal guardian must accompany a child scheduled for surgery and plan to stay at the hospital until the child is discharged.  Please do not bring other children with you.   9. Please wear simple, loose fitting clothing to the hospital.  Do not bring valuables (money, credit cards, checkbooks, etc.) Do not wear any makeup (including no eye makeup) or nail polish on your fingers or toes.   10. DO NOT wear any jewelry or piercings on day of surgery.  All body piercing jewelry must be removed.   11. If you have dentures,glasses, or contacts they will be removed before going to the OR; we will provide you a container.    12. Please see your family doctor/and cardiologist for a history & physical and/or concerning medications.  Bring any test results/reports from your physician's office. Have history and labs faxed to [phone]    13. Remember to bring Blood Bank bracelet on the day of  surgery.   14. If you have a Living Will and Durable Power of Attorney for Healthcare, please bring in a copy.   15. Notify your Surgeon if you develop any illness between now and surgery  time, cough, cold, fever, sore throat, nausea, vomiting, etc.  Please notify your surgeon if you experience dizziness, shortness of breath or blurred vision between now & the time of your surgery   16. DO NOT shave your operative site 96 hours prior to surgery. For face & neck surgery, men may use an electric razor 48 hours prior to surgery.   17. Shower with _x__Antibacterial soap (x_chlorhexidine for total joint  Pt's) shower two times before surgery.(the morning of and the night before.   18. To provide excellent care visitors will be limited to one in the room at any given time.  Please call pre admission testing if you any further questions [phone] or [phone]

## 2012-12-23 LAB — BASIC METABOLIC PANEL
BUN: 14 mg/dL (ref 7–18)
CO2: 27 mEq/L (ref 21–32)
Calcium: 9.2 mg/dL (ref 8.3–10.6)
Chloride: 106 mEq/L (ref 99–110)
Creatinine: 0.9 mg/dL (ref 0.6–1.1)
GFR African American: >60 (ref >60)
GFR Non-African American: >60 (ref >60)
Glucose: 99 mg/dL (ref 70–99)
Potassium: 3.6 mEq/L (ref 3.5–5.1)
Sodium: 140 mEq/L (ref 136–145)

## 2012-12-23 LAB — AMYLASE: Amylase: 30 U/L (ref 25–115)

## 2012-12-23 LAB — POCT GLUCOSE: POC Glucose: 98 mg/dl (ref 70–99)

## 2012-12-23 LAB — HEPATIC FUNCTION PANEL
ALT: 36 U/L (ref 10–40)
AST: 41 U/L — ABNORMAL HIGH (ref 15–37)
Albumin: 4.1 g/dL (ref 3.4–5.0)
Alkaline Phosphatase: 53 U/L (ref 45–129)
Bilirubin, Direct: 0.21 mg/dL (ref 0.00–0.30)
Bilirubin, Indirect: 0.4 mg/dL (ref 0.0–1.0)
Total Bilirubin: 0.65 mg/dL (ref 0.00–1.00)
Total Protein: 6.9 g/dL (ref 6.4–8.2)

## 2012-12-23 LAB — POC PREGNANCY UR-QUAL: Pregnancy, Urine: NEGATIVE

## 2012-12-23 MED ORDER — OXYCODONE HCL 5 MG PO TABS
5 MG | ORAL_TABLET | ORAL | Status: DC | PRN
Start: 2012-12-23 — End: 2013-08-27

## 2012-12-23 MED ORDER — HYDRALAZINE HCL 20 MG/ML IJ SOLN
20 | INTRAMUSCULAR | Status: DC | PRN
Start: 2012-12-23 — End: 2012-12-24

## 2012-12-23 MED ORDER — LIDOCAINE HCL (PF) 1 % IJ SOLN
1 | Freq: Once | INTRAMUSCULAR | Status: AC | PRN
Start: 2012-12-23 — End: 2012-12-23

## 2012-12-23 MED ORDER — ONDANSETRON HCL 4 MG PO TABS
4 MG | ORAL_TABLET | Freq: Three times a day (TID) | ORAL | Status: DC | PRN
Start: 2012-12-23 — End: 2013-08-27

## 2012-12-23 MED ORDER — LABETALOL HCL 5 MG/ML IV SOLN
5 | INTRAVENOUS | Status: DC | PRN
Start: 2012-12-23 — End: 2012-12-24

## 2012-12-23 MED ORDER — ONDANSETRON HCL 4 MG/2ML IJ SOLN
4 | Freq: Once | INTRAMUSCULAR | Status: AC | PRN
Start: 2012-12-23 — End: 2012-12-23

## 2012-12-23 MED ORDER — HYDROMORPHONE HCL PF 2 MG/ML IJ SOLN
2 | INTRAMUSCULAR | Status: AC | PRN
Start: 2012-12-23 — End: 2012-12-23
  Administered 2012-12-23 (×4): 0.5 mg via INTRAVENOUS

## 2012-12-23 MED ADMIN — enoxaparin (LOVENOX) injection 40 mg: SUBCUTANEOUS | @ 13:00:00 | NDC 00075801401

## 2012-12-23 MED ADMIN — ondansetron (ZOFRAN) injection 4 mg: INTRAVENOUS | @ 14:00:00 | NDC 00781301072

## 2012-12-23 MED ADMIN — ceFAZolin (ANCEF) 1 g IVPB 50mL minibag D5W: INTRAVENOUS | @ 13:00:00 | NDC 00781345170

## 2012-12-23 MED ADMIN — oxyCODONE (ROXICODONE) immediate release tablet 5 mg: ORAL | @ 15:00:00 | NDC 00406055262

## 2012-12-23 MED ADMIN — acetaminophen (OFIRMEV) infusion 1,000 mg: INTRAVENOUS | @ 15:00:00 | NDC 43825010201

## 2012-12-23 MED ADMIN — oxyCODONE (ROXICODONE) immediate release tablet 5 mg: ORAL | @ 15:00:00 | NDC 00406055223

## 2012-12-23 MED ADMIN — lactated ringers infusion: INTRAVENOUS | @ 12:00:00 | NDC 00338011704

## 2012-12-23 MED FILL — CEFAZOLIN SODIUM 1 G IJ SOLR: 1 g | INTRAMUSCULAR | Qty: 1000

## 2012-12-23 MED FILL — OFIRMEV 10 MG/ML IV SOLN: 10 MG/ML | INTRAVENOUS | Qty: 100

## 2012-12-23 MED FILL — BUPIVACAINE HCL (PF) 0.5 % IJ SOLN: 0.5 % | INTRAMUSCULAR | Qty: 30

## 2012-12-23 MED FILL — HYDROMORPHONE HCL PF 2 MG/ML IJ SOLN: 2 MG/ML | INTRAMUSCULAR | Qty: 1

## 2012-12-23 MED FILL — ONDANSETRON HCL 4 MG/2ML IJ SOLN: 4 MG/2ML | INTRAMUSCULAR | Qty: 2

## 2012-12-23 MED FILL — OXYCODONE HCL 5 MG PO TABS: 5 MG | ORAL | Qty: 1

## 2012-12-23 MED FILL — CONRAY 60 % IJ SOLN: 60 % | INTRAMUSCULAR | Qty: 50

## 2012-12-23 MED FILL — LIDOCAINE HCL (PF) 1 % IJ SOLN: 1 % | INTRAMUSCULAR | Qty: 2

## 2012-12-23 MED FILL — LACTATED RINGERS IV SOLN: INTRAVENOUS | Qty: 1000

## 2012-12-23 MED FILL — HEPARIN SODIUM (PORCINE) 5000 UNIT/ML IJ SOLN: 5000 UNIT/ML | INTRAMUSCULAR | Qty: 1

## 2012-12-23 MED FILL — LOVENOX 40 MG/0.4ML SC SOLN: 40 MG/0.4ML | SUBCUTANEOUS | Qty: 0.4

## 2012-12-23 NOTE — Progress Notes (Signed)
Family notified and given update regarding patient status

## 2012-12-23 NOTE — H&P (Signed)
Department of General Surgery - Adult  Surgical Service   Attending History and Physical        CHIEF COMPLAINT:  RUQ pain      History Obtained From:  patient    HISTORY OF PRESENT ILLNESS:    This patient is a 42 y.o. female who presents with need for lap chole.    Past Medical History:        Diagnosis Date   ??? Fibromyalgia    ??? Asthma    ??? Pleurisy      multiple episodes "several yrs ago"   ??? GERD (gastroesophageal reflux disease)    ??? Protein S deficiency    ??? Hypothyroidism    ??? Pulmonary embolism    ??? Migraine    ??? Diabetes mellitus      Past Surgical History:        Procedure Laterality Date   ??? Pelvic laparoscopy       Current Medications:  Current Outpatient Prescriptions   Medication Sig Dispense Refill   ??? Enoxaparin Sodium (LOVENOX SC) Inject  into the skin 2 times daily. Pt to do prior to surgery due to history       ??? metformin (GLUCOPHAGE) 500 MG tablet Take 500 mg by mouth 2 times daily (with meals).       ??? methocarbamol (ROBAXIN) 500 MG tablet Take 500 mg by mouth 3 times daily as needed.       ??? aspirin EC 81 MG EC tablet Take 81 mg by mouth daily.         ??? warfarin (COUMADIN) 1 MG tablet Take 5 mg by mouth Daily. Pt takes 5 mg every day except Wed and Sun she takes 7.5 mg.       ??? propranolol (INDERAL) 20 MG tablet Take 40 mg by mouth nightly.       ??? FA-Pyridoxine-Cyancobalamin (FOLBIC PO) Take 1 tablet by mouth daily.       ??? levothyroxine (SYNTHROID) 25 MCG tablet Take 50 mcg by mouth daily.         ??? lansoprazole (PREVACID) 30 MG capsule Take 30 mg by mouth daily.         ??? SUMAtriptan (IMITREX) 100 MG tablet Take 100 mg by mouth once as needed.         Current Facility-Administered Medications   Medication Dose Route Frequency Provider Last Rate Last Dose   ??? ceFAZolin (ANCEF) 1 g IVPB 50mL minibag D5W  1 g Intravenous Once Murvin Donning, MD   1 g at 12/23/12 1610   ??? lactated ringers infusion   Intravenous Continuous Alinda Dooms, MD 125 mL/hr at 12/23/12 0703     ??? sodium chloride  flush 0.9 % injection 10 mL  10 mL Intravenous Q12H SCH Alinda Dooms, MD       ??? sodium chloride flush 0.9 % injection 10 mL  10 mL Intravenous PRN Alinda Dooms, MD       ??? lidocaine 1 % (PF) injection 1 mL  1 mL Intradermal Once PRN Alinda Dooms, MD       ??? ondansetron [facility]) injection 4 mg  4 mg Intravenous Once PRN Sabino Gasser, MD       ??? labetalol (NORMODYNE;TRANDATE) injection 5 mg  5 mg Intravenous Q10 Min PRN Sabino Gasser, MD       ??? hydrALAZINE (APRESOLINE) injection 5 mg  5 mg Intravenous Q10 Min PRN Sabino Gasser, MD       ???  HYDROmorphone HCl PF (DILAUDID) injection 0.5 mg  0.5 mg Intravenous Q5 Min PRN Sabino Gasser, MD       ??? enoxaparin (LOVENOX) 40 MG/0.4ML injection              Home Medications:  Prior to Admission medications    Medication Sig Start Date End Date Taking? Authorizing Provider   Enoxaparin Sodium (LOVENOX SC) Inject  into the skin 2 times daily. Pt to do prior to surgery due to history   Yes Historical Provider, MD   metformin (GLUCOPHAGE) 500 MG tablet Take 500 mg by mouth 2 times daily (with meals).   Yes Historical Provider, MD   methocarbamol (ROBAXIN) 500 MG tablet Take 500 mg by mouth 3 times daily as needed.   Yes Historical Provider, MD   aspirin EC 81 MG EC tablet Take 81 mg by mouth daily.     Yes Historical Provider, MD   warfarin (COUMADIN) 1 MG tablet Take 5 mg by mouth Daily. Pt takes 5 mg every day except Wed and Sun she takes 7.5 mg.   Yes Historical Provider, MD   propranolol (INDERAL) 20 MG tablet Take 40 mg by mouth nightly.   Yes Historical Provider, MD   FA-Pyridoxine-Cyancobalamin (FOLBIC PO) Take 1 tablet by mouth daily.   Yes Historical Provider, MD   levothyroxine (SYNTHROID) 25 MCG tablet Take 50 mcg by mouth daily.     Yes Historical Provider, MD   lansoprazole (PREVACID) 30 MG capsule Take 30 mg by mouth daily.     Yes Historical Provider, MD   SUMAtriptan (IMITREX) 100 MG tablet Take 100 mg by mouth once as needed.     Historical Provider, MD     Allergies:  Phenergan; Gluten meal; Lactose; Orange; Tomato; Codeine; Percocet; and Vicodin      Social History:   TOBACCO:   reports that she has never smoked. She has never used smokeless tobacco.  ETOH:   reports that she does not drink alcohol.  Family History:   History reviewed. No pertinent family history.  REVIEW OF SYSTEMS:    Patient reports no complaints related to eyes, ears, nose , throat or mouth.  No chest pain or SOB.  No urinary complaints or musculoskeletal complaints.  No skin rashes, bleeding tendencies.  Current GI complaints RUQ pain.    PHYSICAL EXAM:    VITALS:  BP 106/69   Pulse 77   Temp(Src) 98.8 ??F (37.1 ??C) (Temporal)   Resp 16   Ht 5\' 2"  (1.575 m)   Wt 170 lb (77.111 kg)   BMI 31.09 kg/m2   SpO2 99%   LMP 12/14/2012  Comfortable  Eyes:  No scleral icterus  Mouth:  Mucus membranes moist  Skin:  No rashes  Chest:  CTA  Heart:  Regular  Abdomen:Soft.  Non distended.  Tender RUQ.  Extremities:  No edema  Neurologic:  No focal deficits  Psychiatric : AAA O x 3      DATA:  Hepatic Function Panel:    Lab Results   Component Value Date    ALKPHOS 53 12/23/2012    ALT 36 12/23/2012    AST 41 12/23/2012    PROT 6.9 12/23/2012    PROT 7.3 12/28/2010    BILITOT 0.65 12/23/2012    BILIDIR 0.21 12/23/2012    IBILI 0.4 12/23/2012    LABALBU 4.1 12/23/2012       ASSESSMENT:   Symptomatic gallstones    Plan:    The  diagnosis and recommended procedure were explained.  Questions answered.  Prepare for surgery.

## 2012-12-23 NOTE — Progress Notes (Signed)
Pt d/c'd from unit via wheelchair.

## 2012-12-23 NOTE — Plan of Care (Signed)
Phase II:  1.  Patient is identified using name and the date of birth.  2.  The patient is free from signs and symptoms of chemical, electrical, laser, radiation, positioning, or transfer/transport injury.  3.  The patient receives appropriate medication(s), safely administered during the Perioperative period.  4.  The patient has wound/tissue perfusion consistent with or improved from baseline levels established preoperatively.  5.  The patient is at or returning to normothermia at the conclusion of the immediate postoperative period.  6.  The patient's fluid, electrolyte, and acid base balances are consistent with or improved from baseline levels established preoperatively.  7.  The patient's pulmonary function is consistent with or improved from baseline levels established preoperatively.  8.  The patient's cardiovascular status is consistent with or improved from baseline levels established preoperatively.  9.  The patient/caregiver demonstrates knowledge of nutritional management related to the operative or other invasive procedure.  10.  The patient/caregiver demonstrates knowledge of medication, pain, and wound management.  11.  The patient participates in the rehabilitation process as applicable.  12.  The patient/caregiver participates in decisions affection his or her Perioperative plan of care.  13.  The patient's care is consistent with the individualized Perioperative plan of care.  14.  The patient's right to privacy is maintained.  15.  The patient is the recipient of competent and ethical care within legal standards of practice.  16.  The patient's value system, lifestyle, ethnicity, and culture are considered, respected, and incorporated in the Perioperative plan of care and understands special services available.  17.  The patient demonstrates and/or reports adequate pain control throughout the the Perioperative period.  18.  The patient's neurological status is consistent with or improved from  baseline levels established preoperatively.  19.  The patient/caregiver demonstrates knowledge of the expected responses to the operative or invasive procedure.  20.  Patient/Caregiver has reduced anxiety.  Interventions- Familiarize with environment and equipment.  21. Other:  22. Other:

## 2012-12-23 NOTE — Progress Notes (Signed)
D/c instructions read and understood to pt and family.

## 2012-12-23 NOTE — Progress Notes (Signed)
Patient states pain is easing and refuses po fluids at this time. Abdomen soft and dressings times 4 to abd. Dry and intact

## 2012-12-23 NOTE — Plan of Care (Signed)
Pre-Operative:  1.  Patient/Caregiver identifies - states name and date of birth.  2.  The patient is free from signs and symptoms of injury.  3.  The patient receives appropriate medication(s), safely administered during the Perioperative period.  4.  The patient is free from signs and symptoms of infection.  5.  The patient has wound / tissue perfusion.  6.  The patients's fluid, electrolyte, and acid-base balances are established preoperatively.  7.  The patient's pulmonary function is established preoperatively.  8.  The patient's cardiovascular status is established preoperatively.  9.  The patient / caregiver demonstrates knowledge of nutritional management related to the operative or other invasive procedure.  10.  The patient/caregiver demonstrates knowledge of medication management.  11.  The patient/caregiver demonstrates knowledge of pain management.  12.  The patient participates in the rehabilitation process as applicable.  13.  The patient/caregiver participates in decisions affection his or her Perioperative plan of care.  14.  The patient's care is consistent with the individualized Perioperative plan of care.  15.  The patient's right to privacy is maintained.  16.  The patient is the recipient of competent and ethical care within legal standards of practice.  17.  The patient's value system, lifestyle, ethnicity, and culture are considered, respected, and incorporated in the Perioperative plan of care and understands special services available.  18.  The patient demonstrates and/or reports adequate pain control throughout the the Perioperative period.  19.  The patient's neurological status is established preoperatively.  20.  The patient/caregiver demonstrates knowledge of the expected responses to the operative or invasive procedure.  21.  Patient/Caregiver has reduced anxiety.  Interventions- Familiarize with environment and equipment.  22. Patient/Caregiver verbalizes understanding of Phase I  and Phase II process.  23.  Patient pain level is established preoperatively using age appropriate pain scale.  24.  The patient will move to fall risk upon sedation- during and through the recovery phase.  25.  Other:

## 2012-12-23 NOTE — Progress Notes (Signed)
Pasero Opioid-induced Sedation Scale (POSS)    Check the box that best describes the patient at this time:    []  S= Sleep, easy to arouse  [x]  1= Awake and alert  []  2= Slightly drowsy, easily aroused  Status:  Acceptable  Intervention:   No action necessary    []  3=Frequently drowsy, arousable, drifts off to sleep during conversation  Status:  Unacceptable  Intervention:  Monitor respiratory status and sedation level every 15 minutes until sedation level is stable (at less than 3); notify anesthesiologist prior to administering further opioid medication(s).    []  4= Minimal or no response to verbal or physical stimulation  Status:  Unacceptable  Intervention:  Monitor respiratory status and sedation level every 15 minutes until sedation level is stable (at less than 3); notify anesthesiologist immediately.    Rodena Medinamela Geniva Lohnes, RN 12/23/2012

## 2012-12-23 NOTE — Progress Notes (Signed)
Report given to R. Sharin GraveBarnett RN.

## 2012-12-23 NOTE — Progress Notes (Signed)
Pt. checked in for procedure.  Assessment complete.  IV started. Safety check list complete.  Family to bedside. Side rails up x 2. Manaal Mandala, RN

## 2012-12-23 NOTE — Plan of Care (Signed)
Phase I (PACU)  1.  Patient is identified using name and the date of birth.  2.  The patient is free from signs and symptoms of chemical, electrical, laser, radiation, positioning, or transfer/transport injury.  3.  The patient receives appropriate medication(s), safely administered during the Perioperative period.  4.  The patient has wound/tissue perfusion consistent with or improved from baseline levels established preoperatively.  5.  The patient is at or returning to normothermia at the conclusion of the immediate postoperative period.  6.  The patient's fluid, electrolyte, and acid base balances are consistent with or improved from baseline levels established preoperatively.  7.  The patient's pulmonary function is consistent with or improved from baseline levels established preoperatively.  8.  The patient's cardiovascular status is consistent with or improved from baseline levels established preoperatively.  9.  The patient/caregiver participates in decisions affecting his or her Perioperative plan of care.  10.  The patient's care is consistent with the individualized Perioperative plan of care.  11.  The patient's right to privacy is maintained.  12.  The patient is the recipient of competent and ethical care within legal standards of practice.  13.  The patient's value system, lifestyle, ethnicity, and culture are considered, respected, and incorporated in the Perioperative plan of care.  14.  The patient demonstrates and/or reports adequate pain control throughout the the Perioperative period.  15.  The patient's neurological status is consistent with or improved from baseline levels established preoperatively.  16.  The patient/caregiver demonstrates knowledge of the expected responses to the operative or invasive procedure.  17.  Patient/Caregiver has reduced anxiety.  Interventions- Familiarize with environment and equipment.  18.  Other:  19.Other:

## 2012-12-23 NOTE — Progress Notes (Signed)
Arrived from OR with oral airway in place. Respirations unlabored. Abdomen soft and dressings times 4 to abd. Dry and intact. Report received from Rodman KeyJ. Green RN. No discomfort noted at this time

## 2012-12-23 NOTE — Discharge Instructions (Addendum)
[facility]     Donetta Potts. Ward, M.D.   [facility]               [address]                [address]   Woodward Ku, M.D.              [address]           [address]    [place], [place] [address]         [place], [place] [address]   Orion Crook. Shiff, M.D                         [phone]         [phone]        [facility] T. Mariane Duval, M.D.          [place]       POST-OPERATIVE INSTRUCTIONS FOR GALLBLADDER SURGERY      Call the office to schedule your post-operative appointment with your surgeon for two (2) weeks.       You will have either white steri-strips and a water occlusive dressing or surgical glue closing your incisions.      If you have clear bandages over your incisions, you may remove them in 2 days.  Leave the steri-stips in place. These will peel away in 7-10 days.       You may shower in 2 days after removing your dressing.  Wash incisions gently, and pat them dry. Do not rub your incisions.      General guidelines for activity:    Avoid strenuous activity or lifting anything heavier than 15 pounds.     It is OK to be up  walking around, and walking up and down stairs.      Do what is comfortable: stop and rest when you feel tired.     Drink plenty of fluids and stay on a bland diet for 2-3 days after surgery.       Do NOT drive while taking your narcotic pain medicine.  You may resume driving when you feel capable of responding to an urgent situation if needed and not taking prescription pain medication.       Watch for signs of infection:   Excessive warmth or bright redness around your incisions   Leakage of bloody or cloudy fluid from you incisions   Fever over 100.5    During the laparoscopic procedure that you had, gas is pumped into the abdominal cavity.  You may feel abdominal, shoulder, or rib pain for  a few days due to this gas.      You will have pain medicine ordered. Take as directed      If you experience constipation:    Increase your water intake    Increase your activity, walking is best.    A stool softener or mild laxative may be necessary if you still have not had a bowel movement ; call the office for further instructions.      Resume  Lovenox and coumadin tomorrow.  Protime 12/28/2012.          OUTPATIENT POST-OPERATIVE   DISCHARGE INSTRUCTION   ANESTHESIA DISCHARGE INSTRUCTIONS     You are under the influence of drugs-do not drink alcohol,drive,operate machinery,or make any important decisions for 24 hours.Children should not ride Blue[employer]xbikes,skate boards or play on gym sets for 24 hours after surgery.   A responsible adult needs to be with you for 24 hours.   You may experience lightheadedness,dizziness,or sleepiness following surgery.   Rest at home today- increase activity as tolerated.  Progress slowly to a regular diet unless your physician has instructed you otherwise. Take only clear liquids if nauseated or vomiting.   If nausea becomes a problem call your physician.   Coughing,sore throat,and muscle aches are other side effects of anesthesia,and should improve with time.   Do not drive,operate machinery while taking narcotics.  [X]  I have received and reviewed these instructions with the nurse and I understand them. If I have any problems or questions at a later time, I will call my physician.

## 2012-12-23 NOTE — Anesthesia Post-Procedure Evaluation (Signed)
Patient: Jasmine Strickland    Patient location: Recovery  Level of consciousness: Returned to Baseline  Pain Control: Good  Respiration: Adequate  Post-op assessment: No anesthetic complications    Last Vitals:   Filed Vitals:    12/23/12 1023   BP: 105/79   Pulse: 72   Temp: 97.4 ??F (36.3 ??C)   Resp: 16     Post-op Vitals: Stable    Sabino Gasser  11:24 AM

## 2012-12-23 NOTE — Op Note (Signed)
PATIENT NAME:                 PA #:            MR #Jasmine Strickland, Jasmine Strickland              [id number]       [id number]            SURGEON:                              SURG DATE:  DIS DATE:          Murvin Donning, MD               12/24/2011                     DATE OF BIRTH:   AGE:           PATIENT TYPE:     RM #:              Sep 25, 1971       41             [facility]                                     OPERATION PERFORMED:  Laparoscopic cholecystectomy.     PREOPERATIVE DIAGNOSIS:  Symptomatic gallstones.     POSTOPERATIVE DIAGNOSIS:  Symptomatic gallstones.     ANESTHESIA:  General.     COMPLICATIONS:  None.     INDICATIONS FOR THE OPERATION:  A 42 year old female with long-standing  epigastric and right upper quadrant pains.  She also had ultrasound that  showed gallstones.  The risks, benefits and alternatives of operative  intervention were explained, and the patient understood these, accepted these  and elected to proceed.       DESCRIPTION OF PROCEDURE:  The patient was brought to the operating room.   She was placed supine on the operating room table.  General anesthesia was  induced.  She was prepped and draped in the usual surgical sterile fashion.   A vertical infraumbilical incision was made with a knife.  Subcutaneous  tissues were spread.  Penetrating towel clip was used to elevate the anterior  abdominal wall.  A Veress needle was inserted and pneumoperitoneum was  established.  A disposable 5-mm trocar was passed through the incision.   Laparoscope was inserted, and under direct vision an 11-mm port was placed in  the epigastrium and two 5-mm ports in the right upper quadrant.  We had  adequate visualization and retraction.  I identified the cystic duct as it  tapered into the gallbladder.  Two clips were placed away from the  gallbladder, one clip next to the gallbladder, and the cystic duct was  divided.  The cystic artery had two clips placed proximal and one clip  distal, and it was  divided.  The gallbladder was dissected from the  gallbladder fossa of the liver bed, and brought out through the epigastric  incision.  I reinspected the right upper quadrant.  I copiously irrigated the  area.  I suctioned out the irrigant. There was no evident bleeding, bile leak  or complication.  I deflated the abdomen and removed the trocars.  The fascia  at the epigastric port site was reapproximated with 0 Vicryl suture.  Local  anesthetic was infiltrated.  Vicryl 4-0 was used to reapproximate the skin at  all the incisions.  Benzoin and Steri-Strip dressing were placed.     DISPOSITION:  The patient tolerated the procedure without any acute  complication.                                                   Aidan Caloca Cleatis Polka, MD     [id number]  DD: 12/23/2012 08:17   DT: 12/23/2012 08:52   Job #: [id number]  CC: Samael Blades Cleatis Polka, MD  CC: Mirna Mires, MD

## 2012-12-23 NOTE — Brief Op Note (Signed)
Brief Postoperative Note    Jasmine Strickland  Date of Birth:  Sep 15, 1971  [id number]    Pre-operative Diagnosis: Symptomatic gallstones.    Post-operative Diagnosis: Same    Procedure: Lap chole     Anesthesia: General     Surgeons/Assistants: Narissa Beaufort THOMAS Tye Juarez      Estimated Blood Loss: Minimal    Complications: none    Specimens: GB      Hamdan Toscano THOMAS Esty Ahuja

## 2012-12-23 NOTE — Anesthesia Pre-Procedure Evaluation (Signed)
Jasmine Strickland          Jasmine Strickland    Pre-Operative Diagnosis: SYMPTOMATIC GALLSTONES    42 y.o.   BMI:  Body mass index is 31.09 kg/(m^2).     Filed Vitals:    12/23/12 0632   BP: 106/69   Pulse: 77   Temp: 98.8 ??F (37.1 ??C)   TempSrc: Temporal   Resp: 16   Height: 5\' 2"  (1.575 m)   Weight: 170 lb (77.111 kg)   SpO2: 99%       Allergies   Allergen Reactions   ??? Phenergan (Promethazine Hcl) Anaphylaxis   ??? Gluten Meal    ??? Lactose Diarrhea   ??? Orange    ??? Tomato    ??? Codeine Nausea And Vomiting   ??? Percocet (Oxycodone-Acetaminophen) Nausea And Vomiting   ??? Vicodin (Hydrocodone-Acetaminophen) Nausea And Vomiting       Past Medical History   Diagnosis Date   ??? Fibromyalgia    ??? Asthma    ??? Pleurisy      multiple episodes "several yrs ago"   ??? GERD (gastroesophageal reflux disease)    ??? Protein S deficiency    ??? Hypothyroidism    ??? Pulmonary embolism    ??? Migraine    ??? Diabetes mellitus      Past Surgical History   Procedure Laterality Date   ??? Pelvic laparoscopy       History   Substance Use Topics   ??? Smoking status: Never Smoker    ??? Smokeless tobacco: Never Used   ??? Alcohol Use: No     Prior to Admission medications    Medication Sig Start Date End Date Taking? Authorizing Provider   Enoxaparin Sodium (LOVENOX SC) Inject  into the skin 2 times daily. Pt to do prior to surgery due to history   Yes Historical Provider, MD   metformin (GLUCOPHAGE) 500 MG tablet Take 500 mg by mouth 2 times daily (with meals).   Yes Historical Provider, MD   methocarbamol (ROBAXIN) 500 MG tablet Take 500 mg by mouth 3 times daily as needed.   Yes Historical Provider, MD   aspirin EC 81 MG EC tablet Take 81 mg by mouth daily.     Yes Historical Provider, MD   warfarin (COUMADIN) 1 MG tablet Take 5 mg by mouth Daily. Pt takes 5 mg every day except Wed and Sun she takes 7.5 mg.   Yes Historical Provider, MD   propranolol (INDERAL) 20 MG tablet Take 40 mg by mouth nightly.   Yes Historical Provider, MD      FA-Pyridoxine-Cyancobalamin (FOLBIC PO) Take 1 tablet by mouth daily.   Yes Historical Provider, MD   levothyroxine (SYNTHROID) 25 MCG tablet Take 50 mcg by mouth daily.     Yes Historical Provider, MD   lansoprazole (PREVACID) 30 MG capsule Take 30 mg by mouth daily.     Yes Historical Provider, MD   SUMAtriptan (IMITREX) 100 MG tablet Take 100 mg by mouth once as needed.    Historical Provider, MD     Key Labs:  Lab Results   Component Value Date    HGB 13.0 05/31/2012     Lab Results   Component Value Date    PLT 268 05/31/2012     Lab Results   Component Value Date    K 3.7 05/31/2012     Lab Results   Component Value Date    CREATININE 1.1 05/31/2012  Lab Results   Component Value Date    PROTIME 30.1* 06/01/2012      Anesthesia Evaluation     Patient summary reviewed and Nursing notes reviewed    No history of anesthetic complications   Airway   Mallampati: II  TM distance: >3 FB  Neck ROM: full  Dental - normal exam     Pulmonary - normal exam    breath sounds clear to auscultation  (+) asthma (Rare inhaler use),   Cardiovascular - normal exam  (+) hypertension,   (-) past MI, CAD (Negative stress test with normal EF in 05/2012), CABG/stent, dysrhythmias, angina, CHF    ECG reviewed  Rhythm: regular  Rate: normal  Beta Blocker:  Dose within 24 Hrs    Neuro/Psych    (+) neuromuscular disease (Fibromyalgia), headaches (Migraines),   (-) CVA  GI/Hepatic/Renal    (+) GERD well controlled,     Endo/Other    (+) type II diabetes, hypothyroidism, blood dyscrasia (Protein S def, hx of PE, bridged with lovenox)  Abdominal   (+) obese,                   Allergies: Phenergan; Gluten meal; Lactose; Orange; Tomato; Codeine; Percocet; and Vicodin    NPO Status:                              Anesthesia Plan    ASA 3     general   (PONV ppx)  intravenous induction   Anesthetic plan and risks discussed with patient.    Plan discussed with CRNA.          Sabino Gasser   12/23/2012

## 2012-12-23 NOTE — Progress Notes (Signed)
Pasero Opioid-induced Sedation Scale (POSS)    Check the box that best describes the patient at this time:    [] S= Sleep, easy to arouse  [x] 1= Awake and alert  [] 2= Slightly drowsy, easily aroused  Status:  Acceptable  Intervention:   No action necessary    [] 3=Frequently drowsy, arousable, drifts off to sleep during conversation  Status:  Unacceptable  Intervention:  Monitor respiratory status and sedation level every 15 minutes until sedation level is stable (at less than 3); notify anesthesiologist prior to administering further opioid medication(s).    [] 4= Minimal or no response to verbal or physical stimulation  Status:  Unacceptable  Intervention:  Monitor respiratory status and sedation level every 15 minutes until sedation level is stable (at less than 3); notify anesthesiologist immediately.    Rowdy Guerrini, RN 12/23/2012

## 2012-12-24 MED FILL — FENTANYL CITRATE 0.05 MG/ML IJ SOLN: 0.05 MG/ML | INTRAMUSCULAR | Qty: 2

## 2012-12-25 MED FILL — ONDANSETRON HCL 4 MG/2ML IJ SOLN: 4 MG/2ML | INTRAMUSCULAR | Qty: 2

## 2012-12-25 MED FILL — ROCURONIUM BROMIDE 50 MG/5ML IV SOLN: 50 MG/5ML | INTRAVENOUS | Qty: 5

## 2012-12-25 MED FILL — LIDOCAINE HCL (PF) 1 % IJ SOLN: 1 % | INTRAMUSCULAR | Qty: 4

## 2012-12-25 MED FILL — ATROPINE SULFATE 0.4 MG/ML IJ SOLN: 0.4 MG/ML | INTRAMUSCULAR | Qty: 3

## 2012-12-25 MED FILL — QUELICIN 20 MG/ML IJ SOLN: 20 MG/ML | INTRAMUSCULAR | Qty: 10

## 2012-12-25 MED FILL — KETOROLAC TROMETHAMINE 30 MG/ML IJ SOLN 1 ML: 30 MG/ML | INTRAMUSCULAR | Qty: 1

## 2012-12-25 MED FILL — DEXAMETHASONE SODIUM PHOSPHATE 4 MG/ML IJ SOLN: 4 MG/ML | INTRAMUSCULAR | Qty: 1

## 2012-12-25 MED FILL — PROPOFOL 10 MG/ML IV EMUL: 10 MG/ML | INTRAVENOUS | Qty: 40

## 2012-12-25 MED FILL — NEOSTIGMINE METHYLSULFATE 1 MG/ML IJ SOLN: 1 MG/ML | INTRAMUSCULAR | Qty: 10

## 2013-06-20 ENCOUNTER — Inpatient Hospital Stay: Admit: 2013-06-20 | Discharge: 2013-06-20 | Attending: Emergency Medicine

## 2013-06-20 LAB — PROTIME-INR
INR: 3.41 — ABNORMAL HIGH (ref 0.85–1.15)
Protime: 36.4 s — ABNORMAL HIGH (ref 10.0–12.8)

## 2013-06-20 NOTE — Discharge Instructions (Signed)
IMPORTANT:  If you have any trouble getting in to see the physician that we have referred you to today, please call the [facility] at [phone].  Please leave a voice message if they are unavailable and they will return your call.    If you were prescribed an outpatient test, please call [facility] at [phone] to schedule an appointment for your test that was ordered.       ADDITIONAL INSTRUCTIONS FOR ALL PATIENTS:  -If you have been prescribed an antibiotic TAKE IT AS DIRECTED UNTIL IT IS ALL FINISHED.  -If you HAVE RECEIVED OR BEEN PRESCRIBED A MEDICATION THAT MAY CAUSE DROWSINESS. DO NOT DRIVE, DRINK ALCOHOL, OR OPERATE MACHINERY THAT REQUIRES YOU TO BE ALERT.    -If you had an EKG and/or X-Ray reading made in the Emergency Department, it will be reviewed by a Cardiologist and/or Radiologist. If the review changes your diagnosis, you will be contacted.  -If you had a specimen collected for culture, a CULTURE REPORT takes 48-72 hours to generate: You will be contacted if a change in treatment is needed.    -Return if your condition worsens or if you have severe pain, worsening of symptoms such as fever, vomiting or difficulty breathing.    If you have been prescribed an opiate or other controlled substance (see lists below):    [place] may have been prescribed a medication that is a controlled substance.  Controlled substances include pain medications known as opiates and sedative nerve medications known as benzodiazepines.    Some common opiates include:    Codeine (such as Tylenol #3)  Hydrocodone (Vicodin, Lortab, Lorcet, Norco)  Oxycodone (Percocet, Percodan, Oxycodone, Oxy IR)    Some common benzodiazepines include:    Diazepam (Valium)  Lorazepam (Ativan)  Alprazolam (Xanax)  Clonazepam (Klonopin)  Oxazepam (Serax)    All of these controlled substances are highly addictive and frequently abused.  Misuse can and frequently does lead  to addiction as well as overdose and death.  Short term supplies, 3 days or less, are prescribed because of the highly addictive nature of the medication.  Any of the controlled substance medication NOT taken should be disposed of properly and NOT SAVED.  The recommended method of disposing of unused medications is:       Place the medicines in a sealable plastic bag.  If the medicine is a solid, crush it or add water to dissolve it.    Add something undesirable (cat litter, coffee grounds, etc.)    Dispose of sealed bag in household trash    Do not flush or pour unused medicines down a sink or drain.    Also, because of the addictive nature and frequent abuse, these medications are sometimes stolen.  These medications should be kept in a safe place where they cannot be stolen.  Do not keep them in your car or purse.  Lost or stolen prescriptions for controlled substances WILL NOT BE REFILLED in this emergency department, regardless of whether a police report was filed.      Head Injury, Adult  You have had a head injury that does not appear serious at this time. A concussion is a state of changed mental ability, usually from a blow to the head. You should take clear liquids for the rest of the day and then resume your regular diet. You should not take sedatives or alcoholic beverages for as long as directed by your  caregiver after discharge. After injuries such as yours, most problems occur within the first 24 hours.  SYMPTOMS  These minor symptoms may be experienced after discharge:   Memory difficulties.   Dizziness.   Headaches.   Double vision.   Hearing difficulties.   Depression.   Tiredness.   Weakness.   Difficulty with concentration.  If you experience any of these problems, you should not be alarmed. A concussion requires a few days for recovery. Many patients with head injuries frequently experience such symptoms. Usually, these problems disappear without medical care. If symptoms last for more  than one day, notify your caregiver. See your caregiver sooner if symptoms are becoming worse rather than better.  HOME CARE INSTRUCTIONS    During the next 24 hours you must stay with someone who can watch you for the warning signs listed below.  Although it is unlikely that serious side effects will occur, you should be aware of signs and symptoms which may necessitate your return to this location. Side effects may occur up to 7  10 days following the injury. It is important for you to carefully monitor your condition and contact your caregiver or seek immediate medical attention if there is a change in your condition.  SEEK IMMEDIATE MEDICAL CARE IF:    There is confusion or drowsiness.   You can not awaken the injured person.   There is nausea (feeling sick to your stomach) or continued, forceful vomiting.   You notice dizziness or unsteadiness which is getting worse, or inability to walk.   You have convulsions or unconsciousness.   You experience severe, persistent headaches not relieved by over-the-counter or prescription medicines for pain. (Do not take aspirin as this impairs clotting abilities). Take other pain medications only as directed.   You can not use arms or legs normally.   There is clear or bloody discharge from the nose or ears.  MAKE SURE YOU:    Understand these instructions.   Will watch your condition.   Will get help right away if you are not doing well or get worse.  Document Released: 12/02/2005 Document Revised: 02/24/2012 Document Reviewed: 10/20/2009  [facility] Patient Information 2014 [place].

## 2013-06-20 NOTE — ED Notes (Signed)
Ambulatory from department without difficulty.  No distress noted.  Pt instructed to follow up with family doctor or doctor referred by ED physician.  Pt also given number to the Pt advocate.  Pt reports no further needs or questions prior to discharge.    Sande Brothers, RN  06/20/13 1228

## 2013-06-20 NOTE — ED Provider Notes (Addendum)
HPI Comments: pt states she tripped and fell into a wall hitting her head, denies LOC, c/o dizziness. Is on coumadin     Patient is a 42 y.o. female presenting with head injury. The history is provided by the patient.   Head Injury  Location:  Generalized  Mechanism of injury: fall    Pain details:     Quality:  Aching  Chronicity:  New  Relieved by:  Nothing  Ineffective treatments:  None tried  Associated symptoms: headache    Associated symptoms: no loss of consciousness, no nausea, no neck pain, no numbness, no seizures and no vomiting    Patient presents emergency Department history of slipping on somewhat water hitting back of her head and there was no loss consciousness dizziness that she is a little full feeling in her right ear denies any focal neurological complaints denies vomiting  Patient is on Coumadin for protein S deficiency she has been anticoagulated with Coumadin for over 2 years and has been never been in the toxic range she said her last are not INR was about 2 weeks ago  Review of Systems   Constitutional: Negative for fever and chills.   HENT: Negative for neck pain.    Respiratory: Negative for shortness of breath.    Cardiovascular: Negative for chest pain.   Gastrointestinal: Negative for nausea, vomiting and abdominal pain.   Musculoskeletal: Negative for back pain.   Neurological: Positive for dizziness, light-headedness and headaches. Negative for tremors, seizures, loss of consciousness, syncope, weakness and numbness.   Psychiatric/Behavioral: Negative for behavioral problems.   All other systems reviewed and are negative.    PAST MEDICAL HISTORY   has a past medical history of Fibromyalgia; Asthma; Pleurisy; GERD (gastroesophageal reflux disease); Protein S deficiency (HCC); Hypothyroidism; Pulmonary embolism (HCC); Migraine; and Diabetes mellitus (HCC).    PAST SURGICAL HISTORY   has past surgical history that includes pelvic laparoscopy and other surgical history  (12/23/2012).    FAMILY HISTORY  family history is not on file.    SOCIAL HISTORY   reports that she has never smoked. She has never used smokeless tobacco. She reports that she does not drink alcohol or use illicit drugs.    HOME MEDICATIONS     Prior to Admission medications    Medication Sig Start Date End Date Taking? Authorizing Provider   oxyCODONE (ROXICODONE) 5 MG immediate release tablet Take 1 tablet by mouth every 4 hours as needed for Pain. Take 2 tablets every 4 hours as needed for severe pain. 12/23/12 12/23/13  Murvin Donning, MD   ondansetron (ZOFRAN) 4 MG tablet Take 1 tablet by mouth every 8 hours as needed for Nausea. 12/23/12   Murvin Donning, MD   Enoxaparin Sodium (LOVENOX SC) Inject  into the skin 2 times daily. Pt to do prior to surgery due to history    Historical Provider, MD   SUMAtriptan (IMITREX) 100 MG tablet Take 100 mg by mouth once as needed.    Historical Provider, MD   metformin (GLUCOPHAGE) 500 MG tablet Take 500 mg by mouth 2 times daily (with meals).    Historical Provider, MD   methocarbamol (ROBAXIN) 500 MG tablet Take 500 mg by mouth 3 times daily as needed.    Historical Provider, MD   aspirin EC 81 MG EC tablet Take 81 mg by mouth daily.      Historical Provider, MD   warfarin (COUMADIN) 1 MG tablet Take 5 mg by mouth Daily. Pt  takes 5 mg every day except Wed and Sun she takes 7.5 mg.    Historical Provider, MD   propranolol (INDERAL) 20 MG tablet Take 40 mg by mouth nightly.    Historical Provider, MD   FA-Pyridoxine-Cyancobalamin (FOLBIC PO) Take 1 tablet by mouth daily.    Historical Provider, MD   levothyroxine (SYNTHROID) 25 MCG tablet Take 50 mcg by mouth daily.      Historical Provider, MD   lansoprazole (PREVACID) 30 MG capsule Take 30 mg by mouth daily.      Historical Provider, MD        ALLERGIES  is allergic to phenergan; gluten meal; lactose; orange syrup; tomato; codeine; and hydrocodone.   BP 120/91   Pulse 77   Temp(Src) 98.8 ??F (37.1 ??C) (Oral)   Resp  18   Ht 5\' 2"  (1.575 m)   Wt 168 lb (76.204 kg)   BMI 30.72 kg/m2   SpO2 99%   LMP 06/01/2013    Physical Exam   Nursing note and vitals reviewed.  Constitutional: She is oriented to person, place, and time. Vital signs are normal. She appears well-developed and well-nourished.  Non-toxic appearance. She does not have a sickly appearance. She does not appear ill. No distress.   HENT:   Head: Normocephalic and atraumatic.   Right Ear: External ear normal.   Left Ear: External ear normal.   Mouth/Throat: No oropharyngeal exudate, posterior oropharyngeal edema or posterior oropharyngeal erythema.   Eyes: Conjunctivae and EOM are normal. Pupils are equal, round, and reactive to light.   Neck: Normal range of motion. Neck supple. No JVD present.   Cardiovascular: Normal rate, regular rhythm and intact distal pulses.  Exam reveals no gallop and no friction rub.    No murmur heard.  Pulmonary/Chest: Effort normal and breath sounds normal. No respiratory distress. She has no wheezes. She has no rales.   Abdominal: Soft. Bowel sounds are normal. She exhibits no distension and no mass. There is no tenderness. There is no rigidity, no rebound and no guarding.   Musculoskeletal: Normal range of motion. She exhibits no edema.   Neurological: She is alert and oriented to person, place, and time. She has normal strength. No cranial nerve deficit or sensory deficit. She exhibits normal muscle tone. Coordination normal. GCS eye subscore is 4. GCS verbal subscore is 5. GCS motor subscore is 6.   Skin: Skin is warm and dry. No rash noted.   Psychiatric: She has a normal mood and affect. Her behavior is normal.     ED Triage Vitals   Enc Vitals Group      BP 06/20/13 1126 120/91 mmHg      Pulse 06/20/13 1126 77      Resp 06/20/13 1126 18      Temp 06/20/13 1126 98.8 ??F (37.1 ??C)      Temp Source 06/20/13 1126 Oral      SpO2 06/20/13 1126 99 %      Weight 06/20/13 1126 168 lb (76.204 kg)      Height 06/20/13 1126 5\' 2"  (1.575 m)       Head Cir --       Peak Flow --       Pain Score --       Pain Loc --       Pain Edu? --       Excl. in GC? --        Procedures    MDM  Labs  Results for orders placed during the hospital encounter of 06/20/13   PROTIME-INR       Result Value Range    Protime 36.4 (*) 10.0 - 12.8 sec    INR 3.41 (*) 0.85 - 1.15         Radiology  Radiologist interpretation of CT:  Head: normal      Emergency Department Course:  Patient had no progression of her symptoms in fact right now she does not even have headache  Meds neurological intact in the center  I estimate there is LOW risk for SUBARACHNOID HEMORRHAGE, MENINGITIS, INTRACRANIAL HEMORRHAGE, SUBDURAL OR EPIDURAL HEMATOMA, OR STROKE, thus I consider the discharge disposition reasonable. The patient and/or family and I have discussed the diagnosis and risks, and we agree with discharging home to follow-up with their primary doctor. We also discussed returning to the Emergency Department immediately if new or worsening symptoms occur. We have discussed the symptoms which are most concerning (e.g., changing or worsening pain, weakness, vomiting, fever) that necessitate immediate return.        This document serves as a record of the services and decisions personally performed by Kem Parkinson, MD. It was created on the provider's behalf by Charmian Muff, a trained medical scribe. The creation of this record is based on the provider's statements to the medical scribe. The document has been checked and approved by the provider.     This dictation was generated by voice recognition computer software.  Although all attempts are made to edit the dictation for accuracy, there may be errors in the transcription that are not intended.      Kem Parkinson, MD  06/20/13 1224    Kem Parkinson, MD  06/20/13 1224

## 2013-08-23 MED ORDER — ACYCLOVIR 400 MG PO TABS
400 MG | ORAL_TABLET | ORAL | Status: DC
Start: 2013-08-23 — End: 2013-09-19

## 2013-08-27 LAB — POCT PT/INR: INR,(POC): 3.3

## 2013-08-27 NOTE — Progress Notes (Signed)
Subjective:      Patient ID: Jasmine Strickland is a 42 y.o. female.    Rectal Bleeding   The current episode started 3 to 5 days ago. The problem occurs occasionally. The stool is described as streaked with blood and liquid. Associated symptoms include abdominal pain, diarrhea and nausea.     Patient reports maroon colored stool and she assumed it was blood in her stools. Patient reports diarrhea X 1 week - Patient reports watery diarrhea 2 times daily with urgency (within 15 of eating). Patient with nausea but no vomiting. Patient denies any fever or chills.   Also c/o low abdominal cramping with diarrhea. Patient denies any other pain or associated symptoms. Patient reports long-history of intermittent diarrhea and she has refrained from dairy and gluten for past 7-8 months which has resolved her GI symptoms until this episode that began one week ago.     Patient on coumadin for Protein S deficiency - INR today 3.3. Patient currently on 7.5 mg Tuesday and Thursday and 5 mg all other days.     Review of Systems   Constitutional: Negative for chills and fatigue.   HENT: Negative.    Respiratory: Negative.    Cardiovascular: Negative.    Gastrointestinal: Positive for nausea, abdominal pain, diarrhea, blood in stool and hematochezia.   Genitourinary: Negative.    Musculoskeletal: Negative.    Skin: Negative.    Hematological: Negative.    Psychiatric/Behavioral: Negative.        Objective:   Physical Exam   Constitutional: She is oriented to person, place, and time. Vital signs are normal. She appears well-developed and well-nourished.   HENT:   Head: Normocephalic and atraumatic.   Eyes: Conjunctivae and EOM are normal.   Neck: Normal range of motion. Neck supple.   Cardiovascular: Normal rate, regular rhythm, S1 normal, S2 normal and normal heart sounds.    No murmur heard.  Pulmonary/Chest: Effort normal and breath sounds normal. No accessory muscle usage. No respiratory distress.   Abdominal: Soft. Normal  appearance and bowel sounds are normal. She exhibits no distension and no abdominal bruit. There is no hepatosplenomegaly. There is tenderness in the right lower quadrant and left lower quadrant. No hernia.   Musculoskeletal: Normal range of motion.   Lymphadenopathy:     She has no cervical adenopathy.   Neurological: She is alert and oriented to person, place, and time.   Skin: Skin is warm, dry and intact.   Psychiatric: She has a normal mood and affect.   Vitals reviewed.    BP 118/84   Pulse 84   Temp(Src) 98 ??F (36.7 ??C) (Tympanic)   Wt 182 lb (82.555 kg)   BMI 33.28 kg/m2   SpO2 98%    Assessment/Plan:      Jasmine Strickland was seen today for rectal bleeding.    Diagnoses and associated orders for this visit:    Hematochezia: Patient reports history of recurrent diarrhea and blood in stools. Patient reports 2 liquid stools per daily shortly after eating. Patient denies any recent travels or being in or around creeks, rivers, pond, lake. Patient denies any family history of colon cancer. Discussed the possibility of IBS verus GI symptoms following cholecystectomy, versus GI symptoms related to metformin, versus GI symptoms that can be related to over suppression with thyroid hormone supplement and increased risk for bleeding related to long-term use of warfarin. Other consideration would include diverticulosis/divertciulitis, inflammatory bowel disease. Order for labs and stool for occult blood as  below. Referral to GI given recurrent symptoms as Patient will likely need colonoscopy and/or further GI work-up. Patient to f/u if no better or worsening of symptoms.  - CBC Auto Differential  - BLOOD OCCULT STOOL SCREEN #1; Future  - BLOOD OCCULT STOOL SCREEN #2; Future  - BLOOD OCCULT STOOL SCREEN #3; Future  - Amb External Referral To Gastroenterology    Diarrhea: See note above. Discussed the importance of adequate hydration and fiber in diet.   - Amb External Referral To Gastroenterology    Protein S deficiency: INR 3.3.  Instructed patient to decrease warfarin to 7.5 on Thursday only and 5 mg all other days.   - POCT INR    Type II or unspecified type diabetes mellitus without mention of complication, not stated as uncontrolled: repeat labs as below.   - COMPREHENSIVE METABOLIC PANEL  - HEMOGLOBIN A1C    Hypothyroidism: repeat labs today.  - TSH with Reflex    Screening for lipoid disorders  - LIPID PANEL

## 2013-08-27 NOTE — Patient Instructions (Signed)
Fecal Occult Blood Test (FOBT): About This Test  What is it?  A fecal occult blood test checks for hidden (occult) blood in the stool. You place a small sample of stool on a chemically treated card, pad, or wipe. Then a special chemical solution is put on top of the sample. If the card, pad, or wipe turns blue, there is blood in the stool sample.  Why is this test done?  A fecal occult blood test is done to check for colorectal cancer and other types of gastrointestinal problems. These include hemorrhoids, anal fissures, and colon polyps, which can cause blood in the stools.  How can you prepare for the test?  Before you do a fecal occult blood test, avoid the following for 2 to 3 days before the test:  ?? Eating turnips, beets, radishes, horseradish, artichokes, mushrooms, broccoli, bean sprouts, cauliflower, apples, oranges, bananas, grapes, and melon. These foods can cause the test to be positive for blood when blood is not in the stool.  ?? Eating red meat. This may cause false test results. Small amounts of chicken, [place], or fish will not affect the test.  ?? Taking iron supplements  ?? Taking aspirin (or products that contain aspirin) and nonsteroidal anti-inflammatory drugs (NSAIDs). Also avoid taking other medicines that irritate the stomach or intestines.  ?? Taking vitamin C supplements  Do not do the test during your menstrual period or if you are having bleeding from hemorrhoids. And don't test a stool sample that has been in contact with toilet bowl cleaning products that turn the water blue.  What happens during the test?  You can check for blood in your stool at home. There are different types of home tests you can use. Make sure to follow the instructions that come with any test. For most tests, you will use stool samples from three different bowel movements over three different days.  General instructions  For any home test, follow these guidelines:  ?? Check the expiration date on the package. Do not  use a test kit after its expiration date. The chemicals in the kit may not work properly after that date.  ?? Store the test kit as instructed. Many kits need to be stored in a refrigerator or cool place.  ?? Read all of the instructions for your test closely before doing the test. Be aware of any special things you need to do before the test. This may include not eating certain foods or limiting your physical activity.  ?? Follow the instructions exactly. Do all the steps in order, without skipping any of them.  ?? If a step in the test needs to be timed, use a watch. Don't guess at the timing.  ?? If you are color-blind or have trouble seeing colors, have someone else read the test results for you. Most test results are color changes on a test strip.  ?? Record the results of the test so you can discuss them with your doctor.  Stool guaiac cards  These instructions are for one of the most common tests used to find blood in the stool.  ?? Complete the information on the front of each card.  ?? During a bowel movement, collect a small amount of stool on one end of an applicator. You might try catching the stool on some plastic wrap draped loosely over the toilet bowl and held in place by the toilet seat. If you use a container to collect the stool, first clean and rinse  it well. This will get rid of any substance that may affect the test results.  ?? Apply a thin smear of stool inside box A.  ?? Reuse the same applicator to get a second sample from a different part of the stool. Apply a thin smear inside box B.  ?? Close the cover of the slide.  ?? Complete the remaining two cards in the same way for two other bowel movements.  ?? You probably will be instructed to return all slides to your health professional either in person or by mail within 4 days after you collect the samples.  ?? If your test kit has the developer solution, wait 3 to 5 minutes before you put 1 drop of this solution on the area with the stool. Put 1 drop of  the solution on the control areas of the card so that you will know what positive and negative test results should look like. An area to read the results is found on the reverse side of the card. Turn the card over and read the results within 10 seconds.  Other test kits  ?? Some kits tell you to use a special cloth to wipe with after a bowel movement. After you wipe with the cloth, you put the developer solution on it to check for a color change that means there is blood in the stool.  ?? Other kits have a special test pad that you place in the toilet after a bowel movement. The pad will change color if the stool has blood in it.  What else should you know about the test?  A fecal occult blood test may be used to check for colorectal cancer. But it is never used to diagnose this condition. If a fecal occult blood test finds blood in the stool, you may need more tests to find the reason for the bleeding.  Follow-up care is a key part of your treatment and safety. Be sure to make and go to all appointments, and call your doctor if you are having problems. It's also a good idea to keep a list of the medicines you take.   Where can you learn more?   Go to [email or web address] and sign in to your MyChart account. Enter [phone] in the Search Health Information box to learn more about ???Fecal Occult Blood Test (FOBT): About This Test.???    If you do not have an account, please click on the ???Sign Up Now??? link.     ?? 2006-2014 [employer]. Care instructions adapted under license by [facility]. This care instruction is for use with your licensed healthcare professional. If you have questions about a medical condition or this instruction, always ask your healthcare professional. [employer] disclaims any warranty or liability for your use of this information.  Content Version: 10.1.311062; Current as of: August 19, 2012              Diarrhea: After Your Visit  Your Care  Instructions     Diarrhea is loose, watery stools (bowel movements). The exact cause is often hard to find. Sometimes diarrhea is your body's way of getting rid of what caused an upset stomach. Viruses, food poisoning, and many medicines can cause diarrhea. Some people get diarrhea in response to emotional stress, anxiety, or certain foods.  Almost everyone has diarrhea now and then. It usually isn't serious, and your stools will return to normal soon. The important thing to do is replace the fluids you have lost,  so you can prevent dehydration.  The doctor has checked you carefully, but problems can develop later. If you notice any problems or new symptoms, get medical treatment right away.  Follow-up care is a key part of your treatment and safety. Be sure to make and go to all appointments, and call your doctor if you are having problems. It's also a good idea to know your test results and keep a list of the medicines you take.  How can you care for yourself at home?  ?? Watch for signs of dehydration, which means your body has lost too much water. Dehydration is a serious condition and should be treated right away. Signs of dehydration are:  ?? Increasing thirst and dry eyes and mouth.  ?? Feeling faint or lightheaded.  ?? Darker urine, and a smaller amount of urine than normal.  ?? To prevent dehydration, drink plenty of fluids, enough so that your urine is light yellow or clear like water. Choose water and other caffeine-free clear liquids until you feel better. If you have kidney, heart, or liver disease and have to limit fluids, talk with your doctor before you increase the amount of fluids you drink.  ?? Begin eating small amounts of mild foods the next day, if you feel like it.  ?? Try yogurt that has live cultures of Lactobacillus. (Check the label.)  ?? Avoid spicy foods, fruits, alcohol, and caffeine until 48 hours after all symptoms are gone.  ?? Avoid chewing gum that contains sorbitol.  ?? Avoid dairy products  (except for yogurt with Lactobacillus) while you have diarrhea and for 3 days after symptoms are gone.  ?? The doctor may recommend that you take over-the-counter medicine, such as loperamide (Imodium), if you still have diarrhea after 6 hours. Read and follow all instructions on the label. Do not use this medicine if you have bloody diarrhea, a high fever, or other signs of serious illness. Call your doctor if you think you are having a problem with your medicine.  When should you call for help?  Call 911 anytime you think you may need emergency care. For example, call if:  ?? You passed out (lost consciousness).  ?? Your stools are maroon or very bloody.  Call your doctor now or seek immediate medical care if:  ?? You are dizzy or lightheaded, or you feel like you may faint.  ?? Your stools are black and look like tar, or they have streaks of blood.  ?? You have new or worse belly pain.  ?? You have symptoms of dehydration, such as:  ?? Dry eyes and a dry mouth.  ?? Passing only a little dark urine.  ?? Feeling thirstier than usual.  ?? You have a new or higher fever.  Watch closely for changes in your health, and be sure to contact your doctor if:  ?? Your diarrhea is getting worse.  ?? You see pus in the diarrhea.  ?? You are not getting better after 2 days (48 hours).   Where can you learn more?   Go to [email or web address] and sign in to your MyChart account. Enter [phone] in the Search Health Information box to learn more about ???Diarrhea: After Your Visit.???    If you do not have an account, please click on the ???Sign Up Now??? link.     ?? 2006-2014 [employer]. Care instructions adapted under license by [facility]. This care instruction is for use with your licensed healthcare professional. If you  have questions about a medical condition or this instruction, always ask your healthcare professional. [employer] disclaims any warranty or liability for your use of this  information.  Content Version: 10.1.311062; Current as of: February 24, 2013              Gastrointestinal Bleeding: After Your Visit  Your Care Instructions     The digestive or gastrointestinal tract goes from the mouth to the anus. It is often called the GI tract.  Bleeding can happen anywhere in the GI tract. It may be caused by an ulcer, an infection, or cancer. It may also be caused by medicines such as aspirin or ibuprofen.  Light bleeding may not cause any symptoms at first. But if you continue to bleed for a while, you may feel very weak or tired.  Sudden, heavy bleeding means you need to see a doctor right away. This kind of bleeding can be very dangerous. But it can usually be cured or controlled. The doctor may do some tests to find the cause of your bleeding.  Follow-up care is a key part of your treatment and safety. Be sure to make and go to all appointments, and call your doctor if you are having problems. It's also a good idea to know your test results and keep a list of the medicines you take.  How can you care for yourself at home?  ?? Be safe with medicines. Take your medicines exactly as prescribed. Call your doctor if you think you are having a problem with your medicine. You will get more details on the specific medicines your doctor prescribes.  ?? Do not take aspirin or other anti-inflammatory medicines, such as naproxen (Aleve) or ibuprofen (Advil, Motrin), without talking to your doctor first. Ask your doctor if it is okay to use acetaminophen (Tylenol).  ?? Do not drink alcohol.  ?? The bleeding may make you lose iron. So it's important to eat foods that have a lot of iron. These include red meat, shellfish, poultry, and eggs. They also include beans, raisins, whole-grain breads, and leafy green vegetables. If you want help planning meals, you can make an appointment with a dietitian.  When should you call for help?  Call 911 anytime you think you may need emergency care. For example, call  if:  ?? You have sudden, severe belly pain.  ?? You vomit blood or what looks like coffee grounds.  ?? You passed out (lost consciousness).  ?? Your stools are maroon or very bloody.  Call your doctor now or seek immediate medical care if:  ?? You are dizzy or lightheaded, or you feel like you may faint.  ?? Your stools are black and look like tar, or they have streaks of blood.  ?? You have belly pain.  ?? You vomit or have nausea.  ?? You have trouble swallowing, or it hurts when you swallow.  Watch closely for changes in your health, and be sure to contact your doctor if:  ?? You do not get better as expected.   Where can you learn more?   Go to [email or web address] and sign in to your MyChart account. Enter [phone] in the Search Health Information box to learn more about ???Gastrointestinal Bleeding: After Your Visit.???    If you do not have an account, please click on the ???Sign Up Now??? link.     ?? 2006-2014 [employer]. Care instructions adapted under license by [facility]. This care instruction is for use  with your licensed healthcare professional. If you have questions about a medical condition or this instruction, always ask your healthcare professional. [place] any warranty or liability for your use of this information.  Content Version: 10.1.311062; Current as of: July 30, 2012

## 2013-08-28 LAB — CBC WITH AUTO DIFFERENTIAL
Basophils %: 0.6 %
Basophils Absolute: 0 10*3/uL (ref 0.0–0.2)
Eosinophils %: 1.5 %
Eosinophils Absolute: 0.1 10*3/uL (ref 0.0–0.6)
Hematocrit: 37.8 % (ref 36.0–48.0)
Hemoglobin: 12.9 g/dL (ref 12.0–16.0)
Lymphocytes %: 37 %
Lymphocytes Absolute: 1.4 10*3/uL (ref 1.0–5.1)
MCH: 31.2 pg (ref 26.0–34.0)
MCHC: 34.1 g/dL (ref 31.0–36.0)
MCV: 91.4 fL (ref 80.0–100.0)
MPV: 9.8 fL (ref 5.0–10.5)
Monocytes %: 8.1 %
Monocytes Absolute: 0.3 10*3/uL (ref 0.0–1.3)
Neutrophils %: 52.8 %
Neutrophils Absolute: 2 10*3/uL (ref 1.7–7.7)
Platelets: 238 10*3/uL (ref 135–450)
RBC: 4.14 M/uL (ref 4.00–5.20)
RDW: 14 % (ref 12.4–15.4)
WBC: 3.8 10*3/uL — ABNORMAL LOW (ref 4.0–11.0)

## 2013-08-28 LAB — COMPREHENSIVE METABOLIC PANEL
ALT: 10 U/L (ref 10–40)
AST: 12 U/L — ABNORMAL LOW (ref 15–37)
Albumin/Globulin Ratio: 1.8 (ref 1.1–2.2)
Albumin: 4.3 g/dL (ref 3.4–5.0)
Alkaline Phosphatase: 83 U/L (ref 40–129)
BUN: 10 mg/dL (ref 7–20)
CO2: 25 mmol/L (ref 21–32)
Calcium: 9.3 mg/dL (ref 8.3–10.6)
Chloride: 104 mmol/L (ref 99–110)
Creatinine: 0.7 mg/dL (ref 0.6–1.1)
GFR African American: >60 (ref >60)
GFR Non-African American: >60 (ref >60)
Globulin: 2.4 g/dL
Glucose: 91 mg/dL (ref 70–99)
Potassium: 4.9 mmol/L (ref 3.5–5.1)
Sodium: 138 mmol/L (ref 136–145)
Total Bilirubin: 0.5 mg/dL (ref 0.0–1.0)
Total Protein: 6.7 g/dL (ref 6.4–8.2)

## 2013-08-28 LAB — TSH WITH REFLEX: TSH: 1.98 u[IU]/mL (ref 0.27–4.20)

## 2013-08-28 LAB — LIPID PANEL
Cholesterol, Total: 191 mg/dL (ref 0–199)
HDL: 51 mg/dL (ref 40–60)
LDL Calculated: 116 mg/dL — ABNORMAL HIGH (ref <100)
Triglycerides: 120 mg/dL (ref 0–150)
VLDL Cholesterol Calculated: 24 mg/dL

## 2013-08-28 LAB — HEMOGLOBIN A1C
Hemoglobin A1C: 5.7 %
eAG: 116.9 mg/dL

## 2013-08-30 NOTE — Progress Notes (Signed)
Quick Note:    Sugars well controlled. tsh normal  ______

## 2013-09-06 LAB — POCT OCCULT BLOOD STOOL COLON CA SCREEN 1-3
Occult Blood Fecal: NEGATIVE
Occult Blood Fecal: POSITIVE
Occult Blood Fecal: POSITIVE

## 2013-09-06 MED ORDER — ONDANSETRON HCL 4 MG PO TABS
4 MG | ORAL_TABLET | Freq: Three times a day (TID) | ORAL | Status: DC | PRN
Start: 2013-09-06 — End: 2014-02-22

## 2013-09-06 NOTE — Addendum Note (Signed)
Addended by: Dorathy Daft on: 09/06/2013 02:48 PM     Modules accepted: Orders

## 2013-09-19 NOTE — Discharge Instructions (Signed)
Return for fever, chills, actively vomiting blood or worsening symptoms or any concern.    Hold this evening's dose of Coumadin and follow up with your primary care physician as her current level is 3.62 and see when he would like to redraw blood work    IMPORTANT:  If you have any trouble getting in to see the physician that we have referred you to today, please call the [facility] at [phone].  Please leave a voice message if they are unavailable and they will return your call.    If you were prescribed an outpatient test, please call [facility] at [phone] to schedule an appointment for your test that was ordered.           ADDITIONAL INSTRUCTIONS FOR ALL PATIENTS:  -If you have been prescribed an antibiotic TAKE IT AS DIRECTED UNTIL IT IS ALL FINISHED.  -If you HAVE RECEIVED OR BEEN PRESCRIBED A MEDICATION THAT MAY CAUSE DROWSINESS. DO NOT DRIVE, DRINK ALCOHOL, OR OPERATE MACHINERY THAT REQUIRES YOU TO BE ALERT.    -If you had an EKG and/or X-Ray reading made in the Emergency Department, it will be reviewed by a Cardiologist and/or Radiologist. If the review changes your diagnosis, you will be contacted.  -If you had a specimen collected for culture, a CULTURE REPORT takes 48-72 hours to generate: You will be contacted if a change in treatment is needed.    -Return if your condition worsens or if you have severe pain, worsening of symptoms such as fever, vomiting or difficulty breathing.      Gastroesophageal Reflux Disease (GERD): After Your Visit  Your Care Instructions     Gastroesophageal reflux disease (GERD) is the backward flow of stomach acid into the esophagus. The esophagus is the tube that leads from your throat to your stomach. A one-way valve prevents the stomach acid from moving up into this tube. When you have GERD, this valve does not close tightly enough.  If you have mild GERD symptoms including heartburn, you may be able to control the problem with antacids  or over-the-counter medicine. Changing your diet, losing weight, and making other lifestyle changes can also help reduce symptoms.  Follow-up care is a key part of your treatment and safety. Be sure to make and go to all appointments, and call your doctor if you are having problems. It's also a good idea to know your test results and keep a list of the medicines you take.  How can you care for yourself at home?   Take your medicines exactly as prescribed. Call your doctor if you think you are having a problem with your medicine.   Your doctor may recommend over-the-counter medicine. For mild or occasional indigestion, antacids, such as Tums, Gaviscon, Mylanta, or Maalox, may help. Your doctor also may recommend over-the-counter acid reducers, such as Pepcid AC, Tagamet HB, Zantac 75, or Prilosec. Read and follow all instructions on the label. If you use these medicines often, talk with your doctor.   Change your eating habits.   It's best to eat several small meals instead of two or three large meals.   After you eat, wait 2 to 3 hours before you lie down.   Chocolate, mint, and alcohol can make GERD worse.   Spicy foods, foods that have a lot of acid (like tomatoes and oranges), and coffee can make GERD symptoms worse in some people. If your symptoms are worse after you eat a certain food, you may want  to stop eating that food to see if your symptoms get better.   Do not smoke or chew tobacco. Smoking can make GERD worse. If you need help quitting, talk to your doctor about stop-smoking programs and medicines. These can increase your chances of quitting for good.   If you have GERD symptoms at night, raise the head of your bed 6 to 8 inches by putting the frame on blocks or placing a foam wedge under the head of your mattress. (Adding extra pillows does not work.)   Do not wear tight clothing around your middle.   Lose weight if you need to. Losing just 5 to 10 pounds can help.  When should you call for  help?  Call your doctor now or seek immediate medical care if:   You have new or different belly pain.   Your stools are black and tarlike or have streaks of blood.  Watch closely for changes in your health, and be sure to contact your doctor if:   Your symptoms have not improved after 2 days.   Food seems to catch in your throat or chest.   Where can you learn more?   Go to [email or web address] and sign in to your MyChart account. Enter [phone] in the [place] box to learn more about "Gastroesophageal Reflux Disease (GERD): After Your Visit."    If you do not have an account, please click on the "Sign Up Now" link.      2006-2014 [employer]wise, [employer]. Care instructions adapted under license by [facility]. This care instruction is for use with your licensed healthcare professional. If you have questions about a medical condition or this instruction, always ask your healthcare professional. [place] any warranty or liability for your use of this information.  Content Version: 10.1.311062; Current as of: May 01, 2012                Dizziness: After Your Visit  Your Care Instructions  Dizziness is the feeling of unsteadiness or fuzziness in your head. It is different than having vertigo, which is a feeling that the room is spinning or that you are moving or falling. It is also different from lightheadedness, which is the feeling that you are about to faint.  It can be hard to know what causes dizziness. Some people feel dizzy when they have migraine headaches. Sometimes bouts of flu can make you feel dizzy. Some medical conditions, such as heart problems or high blood pressure, can make you feel dizzy. Many medicines can cause dizziness, including medicines for high blood pressure, pain, or anxiety.  If a medicine causes your symptoms, your doctor may recommend that you stop or change the medicine. If it is a problem with your heart, you  may need medicine to help your heart work better. If there is no clear reason for your symptoms, your doctor may suggest watching and waiting for a while to see if the dizziness goes away on its own.  Follow-up care is a key part of your treatment and safety. Be sure to make and go to all appointments, and call your doctor if you are having problems. It's also a good idea to know your test results and keep a list of the medicines you take.  How can you care for yourself at home?   If your doctor recommends or prescribes medicine, take it exactly as directed. Call your doctor if you think you are having a problem with your medicine.  Do not drive while you feel dizzy.   Try to prevent falls. Steps you can take include:   Using nonskid mats, adding grab bars near the tub, and using night-lights.   Clearing your home so that walkways are free of anything you might trip on.   Letting family and friends know that you have been feeling dizzy. This will help them know how to help you.  When should you call for help?  Call 911 anytime you think you may need emergency care. For example, call if:   You passed out (lost consciousness).   You have dizziness along with symptoms of a heart attack. These may include:   Chest pain or pressure, or a strange feeling in the chest.   Sweating.   Shortness of breath.   Nausea or vomiting.   Pain, pressure, or a strange feeling in the back, neck, jaw, or upper belly or in one or both shoulders or arms.   Lightheadedness or sudden weakness.   A fast or irregular heartbeat.   Dizziness occurs with signs of a stroke. These may include:   Sudden numbness, paralysis, or weakness in your face, arm, or leg, especially on only one side of your body.   New problems with walking or balance.   Drooling or slurred speech.   Sudden vision changes.   New problems speaking or understanding simple statements, or feeling confused.   A sudden, severe headache that is different from  past headaches.  Call your doctor now or seek immediate medical care if:   You feel dizzy and have a fever, headache, or ringing in your ears.   You have new or increased nausea and vomiting.   Your dizziness does not go away or comes back.  Watch closely for changes in your health, and be sure to contact your doctor if:   You do not get better as expected.   Where can you learn more?   Go to [email or web address] and sign in to your MyChart account. Enter [phone] in the [place] box to learn more about "Dizziness: After Your Visit."    If you do not have an account, please click on the "Sign Up Now" link.      2006-2014 Healthwise, Incorporated. Care instructions adapted under license by [facility]. This care instruction is for use with your licensed healthcare professional. If you have questions about a medical condition or this instruction, always ask your healthcare professional. [place] any warranty or liability for your use of this information.  Content Version: 10.1.311062; Current as of: October 30, 2012                Lightheadedness or Faintness: After Your Visit  Your Care Instructions  Lightheadedness is a feeling that you are about to faint or "pass out." You do not feel as if you or your surroundings are moving. It is different from vertigo, which is the feeling that you or things around you are spinning or tilting.  Lightheadedness usually goes away or gets better when you lie down. If lightheadedness gets worse, it can lead to a fainting spell.  It is common to feel lightheaded from time to time. Lightheadedness usually is not caused by a serious problem. It often is caused by a short-lasting drop in blood pressure and blood flow to your head that occurs when you get up too quickly from a seated or lying position.  Follow-up care is a key part of your treatment  and safety. Be sure to make and go to all appointments, and  call your doctor if you are having problems. It's also a good idea to know your test results and keep a list of the medicines you take.  How can you care for yourself at home?   Lie down for 1 or 2 minutes when you feel lightheaded. After lying down, sit up slowly and remain sitting for 1 to 2 minutes before slowly standing up.   Avoid movements, positions, or activities that have made you lightheaded in the past.   Get plenty of rest, especially if you have a cold or flu, which can cause lightheadedness.   Make sure you drink plenty of fluids, especially if you have a fever or have been sweating.   Do not drive or put yourself and others in danger while you feel lightheaded.  When should you call for help?  Call 911 anytime you think you may need emergency care. For example, call if:   You have signs of a stroke. These may include:   Sudden numbness, paralysis, or weakness in your face, arm, or leg, especially on only one side of your body.   New problems with walking or balance.   Sudden vision changes.   New problems speaking or understanding simple statements, or feeling confused.   A sudden, severe headache that is different from past headaches.   You have symptoms of a heart attack. These may include:   Chest pain or pressure, or a strange feeling in the chest.   Sweating.   Shortness of breath.   Nausea or vomiting.   Pain, pressure, or a strange feeling in the back, neck, jaw, or upper belly or in one or both shoulders or arms.   Lightheadedness or sudden weakness.   A fast or irregular heartbeat.  After you call 911, the operator may tell you to chew 1 adult-strength or 2 to 4 low-dose aspirin. Wait for an ambulance. Do not try to drive yourself.  Watch closely for changes in your health, and be sure to contact your doctor if:   Your lightheadedness gets worse or does not get better with home care.   Where can you learn more?   Go to [email or web address] and sign in to  your MyChart account. Enter [phone] in the [place] box to learn more about "Lightheadedness or Faintness: After Your Visit."    If you do not have an account, please click on the "Sign Up Now" link.      2006-2014 Healthwise, Incorporated. Care instructions adapted under license by [facility]. This care instruction is for use with your licensed healthcare professional. If you have questions about a medical condition or this instruction, always ask your healthcare professional. [place] any warranty or liability for your use of this information.  Content Version: 10.1.311062; Current as of: February 04, 2012

## 2013-09-19 NOTE — ED Notes (Signed)
Jasmine Strickland is a 42 y.o. female who presents to the Emergency Department for evaluation of abdominal pain, she states it's tender all over. She has a relevant past medical hx of esophageal reflux, which she is taking prevacid for and is also on blood thinners. She states having extreme nausea but no vomiting. Pt states 1 week ago she couldn't keep anything down and went to the doctor. She got lab results back from her doctor and they reported there is blood in her stool. She states waking up this morning with blood clot in her mouth this morning. She has not taken any blood thinners today and was ordered to lower her dosage with in the last week. She has an upcoming appointment with a GI doctor.         The patient was seen by me, Marylynn Pearson MD,  in conjunction with a mid-level provider (nurse practitioner or physician assistant). I have personally performed a face to face visit with the patient and agree with all pertinent clinical information. I have also reviewed and agree with the past medical, family and social history. All lab results, EKG tracings, and radiographic studies or interpretations were reviewed by me. I have reviewed the Midlevel???s findings and agree. My findings on exam by me shows:   Constitutional:  Patient is alert, healthy, well nourished, well developed, comfortable and cooperative.  The  pain level is  intermittent, mild, and appears non-toxic and not acutely ill-appearing.  HENT:    Head: Normocephalic and atraumatic.    Nose: Nose normal.   Eyes: Extraocular motions are normal. Right eye exhibits no discharge. Left eye exhibits no discharge.   Neck: Normal range of motion and phonation normal. Neck supple.   PULMONARY/CHEST: clear to auscultation, no wheezes, rales or rhonchi, symmetric air entry, no tachypnea, retractions or cyanosis   CARDIAC: regular rate and rhythm, normal S1, S2, no murmur noted, 2+ pulses throughout and capillary Refill less than 2 seconds  ABDOMINAL EXAM:  rounded, , mild tenderness is present in the epigastrium and in the LLQ, no rebound tenderness, no guarding.  .   Musculoskeletal: Normal range of motion.   Neurological:  Alert. Moving all extremeties normally.  Skin: Skin is warm, dry and intact.   Psychiatric: Patient has a anxious mood. Speech is normal.      XRAYS: Preliminary x-ray interpretation I, Iris Pert MD have   independently reviewed, in absence of radiologist (Final interpretation by radiologist to follow) the following xray(s):    Chest: was negative for infiltrate, effusion, pneumothorax, or wide mediastinum and was negative for acute cardiopulmonary process      Disclaimer: All medical record entries made by the scribe, were at my direction. I have reviewed the chart and agree that the record accurately reflects my personal performance of the history and physical exam I agree that the record accurately reflects my work and the decisions made by me, Iris Pert, MD          This document serves as a record of the services and decisions personally performed by Iris Pert, MD. It was created on the provider's behalf by Craig Staggers, a trained medical scribe. The creation of this record is based on the provider's statements to the medical scribe. The document has been checked and approved by the provider.        Iris Pert, MD  09/19/13 [phone]

## 2013-09-19 NOTE — ED Notes (Signed)
[facility]  Sitting- BP: 119/84 Pulse: 80  Laying- BP: 110/76 Pulse: 76  Standing- BP: 109/78 Pulse: 95    Jasmine Strickland  09/19/13 2146

## 2013-09-20 ENCOUNTER — Inpatient Hospital Stay: Admit: 2013-09-20 | Discharge: 2013-09-20 | Attending: Emergency Medicine

## 2013-09-20 LAB — CBC WITH AUTO DIFFERENTIAL
Basophils %: 0.5 %
Basophils Absolute: 0 10*3/uL (ref 0.0–0.2)
Eosinophils %: 1.5 %
Eosinophils Absolute: 0.1 10*3/uL (ref 0.0–0.6)
Hematocrit: 39.5 % (ref 36.0–48.0)
Hemoglobin: 14 g/dL (ref 12.0–16.0)
Lymphocytes %: 41.8 %
Lymphocytes Absolute: 2 10*3/uL (ref 1.0–5.1)
MCH: 32.8 pg (ref 26.0–34.0)
MCHC: 35.5 g/dL (ref 31.0–36.0)
MCV: 92.7 fL (ref 80.0–100.0)
MPV: 9.4 fL (ref 5.0–10.5)
Monocytes %: 7.6 %
Monocytes Absolute: 0.4 10*3/uL (ref 0.0–1.3)
Neutrophils %: 48.6 %
Neutrophils Absolute: 2.3 10*3/uL (ref 1.7–7.7)
Platelets: 253 10*3/uL (ref 135–450)
RBC: 4.27 M/uL (ref 4.00–5.20)
RDW: 13 % (ref 12.4–15.4)
WBC: 4.7 10*3/uL (ref 4.0–11.0)

## 2013-09-20 LAB — COMPREHENSIVE METABOLIC PANEL
ALT: 10 U/L (ref 10–40)
AST: 16 U/L (ref 15–37)
Albumin/Globulin Ratio: 1.4 (ref 1.1–2.2)
Albumin: 4.5 g/dL (ref 3.4–5.0)
Alkaline Phosphatase: 70 U/L (ref 40–129)
BUN: 12 mg/dL (ref 7–20)
CO2: 27 mmol/L (ref 21–32)
Calcium: 9.4 mg/dL (ref 8.3–10.6)
Chloride: 98 mmol/L — ABNORMAL LOW (ref 99–110)
Creatinine: 0.8 mg/dL (ref 0.6–1.1)
GFR African American: >60 (ref >60)
GFR Non-African American: >60 (ref >60)
Globulin: 3.2 g/dL
Glucose: 92 mg/dL (ref 70–99)
Potassium: 3.1 mmol/L — ABNORMAL LOW (ref 3.5–5.1)
Sodium: 135 mmol/L — ABNORMAL LOW (ref 136–145)
Total Bilirubin: 0.9 mg/dL (ref 0.0–1.0)
Total Protein: 7.7 g/dL (ref 6.4–8.2)

## 2013-09-20 LAB — TROPONIN: Troponin: <0.01 ng/mL (ref <0.01)

## 2013-09-20 LAB — URINALYSIS
Bilirubin Urine: NEGATIVE
Glucose, Ur: NEGATIVE mg/dL
Ketones, Urine: NEGATIVE mg/dL
Leukocyte Esterase, Urine: NEGATIVE
Nitrite, Urine: NEGATIVE
Protein, UA: NEGATIVE mg/dL
Specific Gravity, UA: 1.01 (ref 1.005–1.030)
Urobilinogen, Urine: 0.2 E.U./dL (ref <2.0)
pH, UA: 6 (ref 5.0–8.0)

## 2013-09-20 LAB — MICROSCOPIC URINALYSIS
RBC, UA: NONE SEEN /HPF (ref 0–2)
WBC, UA: NONE SEEN /HPF (ref 0–5)

## 2013-09-20 LAB — PROTIME-INR
INR: 3.62 — ABNORMAL HIGH (ref 0.85–1.16)
Protime: 41.8 s — ABNORMAL HIGH (ref 9.1–12.6)

## 2013-09-20 LAB — APTT: aPTT: 57.8 s — ABNORMAL HIGH (ref 23.1–36.1)

## 2013-09-20 MED ORDER — ONDANSETRON 4 MG PO TBDP
4 MG | ORAL_TABLET | Freq: Two times a day (BID) | ORAL | Status: DC | PRN
Start: 2013-09-20 — End: 2013-10-25

## 2013-09-20 MED ORDER — MECLIZINE HCL 12.5 MG PO TABS
12.5 MG | ORAL_TABLET | Freq: Three times a day (TID) | ORAL | Status: AC | PRN
Start: 2013-09-20 — End: 2013-09-29

## 2013-09-20 MED ADMIN — gi cocktail 30 mL: ORAL | @ 01:00:00 | NDC 09999990764

## 2013-09-20 MED ADMIN — potassium chloride SA (K-DUR;KLOR-CON M) tablet 40 mEq: ORAL | @ 02:00:00 | NDC 68084036011

## 2013-09-20 MED ADMIN — famotidine (PEPCID) injection 20 mg: INTRAVENOUS | @ 01:00:00 | NDC 00641602201

## 2013-09-20 MED FILL — POTASSIUM CHLORIDE CRYS ER 20 MEQ PO TBCR: 20 MEQ | ORAL | Qty: 2

## 2013-09-20 MED FILL — FAMOTIDINE 20 MG/2ML IV SOLN: 20 MG/2ML | INTRAVENOUS | Qty: 2

## 2013-09-20 MED FILL — GI COCKTAIL: Qty: 30

## 2013-09-20 NOTE — ED Provider Notes (Signed)
[place] [facility]  eMERGENCY dEPARTMENT eNCOUnter      Pt Name: Jasmine Strickland  MRN: [id number]  [place] 11-10-1971  Date of evaluation: 09/19/2013  Provider: Ellender Hose, NP    CHIEF COMPLAINT       Chief Complaint   Patient presents with   ??? Dizziness     noted blood in mouth this am with awakening, blood on pillow. having weakness, headache, ear pain, sore throat  today. + stool guiac and on coumadin   (last check 2 weeks ago)       CRITICAL CARE TIME   Total Critical Care time was 0 minutes, excluding separately reportable procedures.  There was a high probability of clinically significant/life threatening deterioration in the patient's condition which required my urgent intervention.  0      HISTORY OF PRESENT ILLNESS  (Location/Symptom, Timing/Onset, Context/Setting, Quality, Duration, Modifying Factors, Severity.)   Jasmine Strickland is a 42 y.o. female who presents to the emergency department With complaints of dizziness. Patient states she is on Coumadin for a protein S deficiency. She is currently working with her doctor regarding a supratherapeutic level. She states when she woke up this morning she noticed that she had a blood clot in the back of her mouth and also some dried blood on her pillow. She additionally had tinnitus nausea and a headache. She had a bad metallic sulfurs taste in her mouth all day long. Overall she does not feel well. She does state she has a history of acid reflux despite taking her normal medications symptoms got progressively worse. She's been working with her primary care physician in providing stole samples that on Friday she was contacted stating 2 of them were positive for blood. She was initially scheduled to follow up with GI. States that they do not accept her insurance so she is currently not following up appropriately. She states over last week her throat is becoming increasingly role in regards to what it feels. She denies any cough or any  exacerbating factors. States she feels dizzy as if the room is spinning around her when she moves her head. Currently denies any associated pain.      Nursing Notes were reviewed and I agree.    REVIEW OF SYSTEMS    (2-9 systems for level 4, 10 or more for level 5)     Review of Systems   Constitutional: Negative for fever and chills.   HENT: Positive for tinnitus. Negative for congestion and rhinorrhea.    Eyes: Negative for discharge and visual disturbance.   Respiratory: Negative for cough, chest tightness and shortness of breath.    Cardiovascular: Negative for chest pain and leg swelling.   Gastrointestinal: Positive for nausea, abdominal pain and blood in stool. Negative for vomiting, diarrhea and constipation.   Genitourinary: Negative for dysuria and frequency.   Musculoskeletal: Negative for myalgias and back pain.   Skin: Negative for rash and wound.   Neurological: Positive for dizziness. Negative for weakness, numbness and headaches.         Except as noted above the remainder of the review of systems was reviewed and negative.       PAST MEDICAL HISTORY         Diagnosis Date   ??? Fibromyalgia    ??? Asthma    ??? Pleurisy      multiple episodes "several yrs ago"   ??? GERD (gastroesophageal reflux disease)    ??? Protein S deficiency ([place])    ???  Hypothyroidism    ??? Pulmonary embolism ([place])    ??? Migraine    ??? Diabetes mellitus ([place])    ??? Type II or unspecified type diabetes mellitus without mention of complication, not stated as uncontrolled        SURGICAL HISTORY           Procedure Laterality Date   ??? Pelvic laparoscopy     ??? Other surgical history  12/23/2012     laparoscopic cholecystectomy   ??? Cholecystectomy     ??? Endometrial ablation         CURRENT MEDICATIONS       Discharge Medication List as of 09/19/2013 10:52 PM      CONTINUE these medications which have NOT CHANGED    Details   traMADol (ULTRAM) 50 MG tablet Take 50 mg by mouth every 8 hours as needed for Pain.      ondansetron (ZOFRAN) 4 MG tablet  Take 1 tablet by mouth every 8 hours as needed for Nausea., Disp-60 tablet, R-1      levothyroxine (SYNTHROID) 50 MCG tablet Take 1 tablet by mouth daily., R-5      metformin (GLUCOPHAGE) 500 MG tablet Take 500 mg by mouth 2 times daily (with meals).      aspirin EC 81 MG EC tablet Take 81 mg by mouth daily.        warfarin (COUMADIN) 1 MG tablet Take 5 mg by mouth daily at 1800. Pt takes 5 mg every day except Thursday she takes 7.5 mg.      propranolol (INDERAL) 20 MG tablet Take 40 mg by mouth nightly.      FA-Pyridoxine-Cyancobalamin (FOLBIC PO) Take 1 tablet by mouth daily.      lansoprazole (PREVACID) 30 MG capsule Take 30 mg by mouth daily.               ALLERGIES     Phenergan; Gluten meal; Lactose; Orange syrup; Tomato; Codeine; and Hydrocodone    FAMILY HISTORY     History reviewed. No pertinent family history.  No family status information on file.        SOCIAL HISTORY      reports that she has never smoked. She has never used smokeless tobacco. She reports that she does not drink alcohol or use illicit drugs.    PHYSICAL EXAM    (up to 7 for level 4, 8 or more for level 5)   ED Triage Vitals   BP Temp Temp Source Pulse Resp SpO2 Height Weight   09/19/13 2034 09/19/13 2034 09/19/13 2034 09/19/13 2034 09/19/13 2034 09/19/13 2034 09/19/13 2034 09/19/13 2034   128/86 mmHg 96 ??F (35.6 ??C) Oral 96 16 99 % 5' 2"$  (1.575 m) 170 lb (77.111 kg)       Physical Exam   Constitutional: She is oriented to person, place, and time. She appears well-developed and well-nourished.   HENT:   Head: Normocephalic and atraumatic.   Right Ear: Tympanic membrane normal.   Left Ear: Tympanic membrane normal.   Nose: Nose normal.   Mouth/Throat: Uvula is midline, oropharynx is clear and moist and mucous membranes are normal.   Eyes: Conjunctivae and EOM are normal. Pupils are equal, round, and reactive to light. Right eye exhibits no discharge. Left eye exhibits no discharge.   Neck: Normal range of motion. Neck supple.      Cardiovascular: Normal heart sounds and intact distal pulses.    No murmur heard.  Pulmonary/Chest: Effort normal  and breath sounds normal.   Abdominal: Soft. Bowel sounds are normal. There is tenderness.   Mild epigastric   Genitourinary: Guaiac negative stool.   Musculoskeletal: Normal range of motion.   Lymphadenopathy:     She has no cervical adenopathy.   Neurological: She is alert and oriented to person, place, and time. She has normal reflexes.   Skin: Skin is warm and dry.           DIAGNOSTIC RESULTS         LABS:  Labs Reviewed   COMPREHENSIVE METABOLIC PANEL - Abnormal; Notable for the following:     Sodium 135 (*)     Potassium 3.1 (*)     Chloride 98 (*)     All other components within normal limits   PROTIME-INR - Abnormal; Notable for the following:     Protime 41.8 (*)     INR 3.62 (*)     All other components within normal limits   APTT - Abnormal; Notable for the following:     aPTT 57.8 (*)     All other components within normal limits   URINALYSIS - Abnormal; Notable for the following:     Blood, Urine TRACE-INTACT (*)     All other components within normal limits   CBC WITH AUTO DIFFERENTIAL   TROPONIN   MICROSCOPIC URINALYSIS   BLOOD OCCULT STOOL #1    Narrative:     ORDER#: [id number]                          ORDERED BY: LEE, CHERYL  SOURCE: Stool                              COLLECTED:  09/19/13 22:30  ANTIBIOTICS AT COLL.:                      RECEIVED :  09/19/13 22:59    Occult Blood Diagnositc                    FINAL       09/19/13 23:05    Negative     TSH WITH REFLEX   BLOOD OCCULT STOOL #1       All other labs were within normal range or not returned as of this dictation.    EMERGENCY DEPARTMENT COURSE and DIFFERENTIAL DIAGNOSIS/MDM:   Vitals:    Filed Vitals:    09/19/13 2034 09/19/13 2036   BP: 128/86    Pulse: 96    Temp: 96 ??F (35.6 ??C) 97.7 ??F (36.5 ??C)   TempSrc: Oral    Resp: 16    Height: 5' 2"$  (1.575 m)    Weight: 170 lb (77.111 kg)    SpO2: 99%        See history of present  illness regarding patient presentation. She is currently working with her primary care physician regarding positive stool specimens. She has no GI to follow up with his accept her insurance. She had a fairly extensive workup. In the emergency department. CBC at this time indicates no anemia. Known leukocytosis. She is noted to have some hypokalemia was given 40 mEq here in the emergency department of potassium. No other abnormalities noted chem panel.guaiac was returned negative. INR is noted to be supratherapeutic at 3.62. She was advised to hold tonight's 5 mg dose of Coumadin follow-up with her  primary care physician tomorrow for further management and possible redraw of INR. Orthostatics were negative. Patient continued to describe some dizziness but overall relief with a GI cocktail and had decreased epigastric tenderness. TSH is currently pending but she states it was drawn within the last 2 weeks it was noted to be normal. Patient was medically managed as outpatient at this time. Discussed this plan of care with the patient. She is in agreement as is her husband who is with her throughout the entire ER course and he'll follow-up accordingly.    This patient was seen, evaluated, and discussed with my attending physician, Dr. Quentin Ore, and he is in agreement with the assessment and plan.    Medications   gi cocktail 30 mL (30 mLs Oral Given 09/19/13 2116)   famotidine (PEPCID) injection 20 mg (20 mg Intravenous Given 09/19/13 2116)   potassium chloride SA (K-DUR;KLOR-CON M) tablet 40 mEq (40 mEq Oral Given 09/19/13 2229)         CONSULTS:  None    PROCEDURES:  None    FINAL IMPRESSION      1. Reflux    2. Dizziness    3. Supratherapeutic INR          DISPOSITION/PLAN   DISPOSITION Decision to Discharge    PATIENT REFERRED TO:  [place]  [address] [place]  [place] [address]  [phone]    Call  As needed    Aubery Lapping, MD  [address]  [place]: [address] [place] [address]  [phone]    Schedule an  appointment as soon as possible for a visit  [place] doctor for further eval and follow up      DISCHARGE MEDICATIONS:  Discharge Medication List as of 09/19/2013 10:52 PM      START taking these medications    Details   ondansetron (ZOFRAN ODT) 4 MG disintegrating tablet Take 1-2 tablets by mouth every 12 hours as needed for Nausea., Disp-12 tablet, R-0      meclizine (ANTIVERT) 12.5 MG tablet Take 1 tablet by mouth 3 times daily as needed for up to 10 days., Disp-30 tablet, R-0             (Please note that portions of this note were completed with a voice recognition program.  Efforts were made to edit the dictations but occasionally words are mis-transcribed.)    Ellender Hose, NP        Micah Flesher, NP  09/20/13 [phone]

## 2013-09-21 LAB — POCT PT/INR: INR,(POC): 1.6

## 2013-09-21 NOTE — Progress Notes (Signed)
Quick Note:    Resume 5mg  daily. Rpt inr in 1 week  ______

## 2013-09-21 NOTE — Progress Notes (Signed)
Noted  

## 2013-09-22 LAB — TSH WITH REFLEX: TSH: 0.84 u[IU]/mL (ref 0.27–4.20)

## 2013-09-22 NOTE — Progress Notes (Unsigned)
09/21/2013-patient contacted me left a vm in regards to finding a gi for her [employer] insurance. Explained on vm that she contacted the gi that her pcp and the er referred her to and they wouldn't accept this. Per er chart, patient was referred to [facility], dr bhaskar who does take this insurance. Tried to call her back several times today no answer. Left a vm with simple instruction to contact me back. Patient called while I was on the line with another patient, left a vm. Tried to call right after no answer. Left info on her vm that Dr. Awanda MinkBhaskar does take her insurance. Asked her to contact me again if she still needed assistance. No response.

## 2013-09-27 MED ORDER — LEVOTHYROXINE SODIUM 50 MCG PO TABS
50 MCG | ORAL_TABLET | ORAL | Status: DC
Start: 2013-09-27 — End: 2014-04-09

## 2013-10-04 MED ORDER — PROPRANOLOL HCL 40 MG PO TABS
40 MG | ORAL_TABLET | ORAL | Status: DC
Start: 2013-10-04 — End: 2014-04-15

## 2013-10-25 LAB — POCT PT/INR: INR,(POC): 2.3

## 2013-10-25 MED ORDER — ENOXAPARIN SODIUM 80 MG/0.8ML SC SOLN
80 MG/0.8ML | INJECTION | Freq: Two times a day (BID) | SUBCUTANEOUS | Status: AC
Start: 2013-10-25 — End: 2013-11-04

## 2013-10-25 MED ORDER — TRAMADOL HCL 50 MG PO TABS
50 MG | ORAL_TABLET | Freq: Two times a day (BID) | ORAL | Status: DC | PRN
Start: 2013-10-25 — End: 2014-02-26

## 2013-10-25 NOTE — Progress Notes (Signed)
Subjective:      Patient ID: Jasmine Strickland is a 42 y.o. female.    HPI  Chief Complaint   Patient presents with   ??? Other     Pt is scheduled for colonoscopy 11/01/13 and needs to have inr checked and be switched from warfarin to lovenox.     Past Medical History   Diagnosis Date   ??? Fibromyalgia    ??? Asthma    ??? Pleurisy      multiple episodes "several yrs ago"   ??? GERD (gastroesophageal reflux disease)    ??? Protein S deficiency ([facility])    ??? Hypothyroidism    ??? Pulmonary embolism ([facility])    ??? Migraine    ??? Diabetes mellitus ([facility])    ??? Type II or unspecified type diabetes mellitus without mention of complication, not stated as uncontrolled [facility])      Past Surgical History   Procedure Laterality Date   ??? Pelvic laparoscopy     ??? Other surgical history  12/23/2012     laparoscopic cholecystectomy   ??? Cholecystectomy     ??? Endometrial ablation       Review of Systems   Constitutional: Negative.    HENT: Negative.    Eyes: Negative.    Respiratory: Negative.    Cardiovascular: Negative.    Gastrointestinal: Positive for nausea and diarrhea (chronic intermittent).   Endocrine: Negative.    Genitourinary: Negative.    Musculoskeletal: Negative.    Skin: Negative.    Neurological: Negative.    Psychiatric/Behavioral: Negative.        Objective:   Physical Exam   Constitutional: She is oriented to person, place, and time. Vital signs are normal. She appears well-developed and well-nourished.   HENT:   Head: Normocephalic and atraumatic.   Right Ear: Hearing, tympanic membrane, external ear and ear canal normal.   Left Ear: Hearing, tympanic membrane, external ear and ear canal normal.   Nose: Nose normal.   Mouth/Throat: Uvula is midline, oropharynx is clear and moist and mucous membranes are normal.   Eyes: Conjunctivae and EOM are normal. Pupils are equal, round, and reactive to light.   Neck: Normal range of motion. Neck supple.   Cardiovascular: Normal rate, regular rhythm, S1 normal, S2 normal and normal heart sounds.     Pulmonary/Chest: Effort normal and breath sounds normal. No accessory muscle usage. No respiratory distress.   Abdominal: Soft. Normal appearance and bowel sounds are normal. There is no hepatosplenomegaly. There is generalized tenderness. There is guarding. There is no rigidity.   Musculoskeletal: Normal range of motion.   Neurological: She is alert and oriented to person, place, and time.   Skin: Skin is warm, dry and intact.   Psychiatric: She has a normal mood and affect.   Vitals reviewed.    BP 114/74   Pulse 70   Temp(Src) 99 ??F (37.2 ??C) (Tympanic)   Wt 179 lb (81.194 kg)   BMI 32.73 kg/m2   SpO2 98%    Assessment:      Jasmine Strickland was seen today for other.    Diagnoses and associated orders for this visit:    Preop examination: Patient scheduled for EGD and colonoscopy on 11-01-13. Patient has factor S deficiency and is on coumadin for clot prevention. INR therapeutic today - discussed discontinuing coumadin 5 days prior to surgery as recommend by surgeon. Order for lovenox X 10 days then patient will resume coumadin as ordered with f/u INR one week after  re-starting coumadin. Discussed the above in detail with patient and she verbalized understanding. See patient instructions. I feel patient is in satisfactory condition to receive moderate sedation for EGD and colonoscopy procedure.     Encounter for monitoring coumadin therapy: see note above.   - POCT INR  - enoxaparin (LOVENOX) 80 MG/0.8ML injection; Inject 0.8 mLs into the skin 2 times daily for 10 days.

## 2013-10-25 NOTE — Patient Instructions (Signed)
Bridging from coumadin to Lovenox and then back to coumadin - stop coumadin 5 days prior to surgery then lovenox twice daily for 10 days then back to coumadin as ordered. Repeat INR one week after starting back on coumadin.    Enoxaparin (Lovenox): After Your Visit  Your Care Instructions  Enoxaparin (Lovenox) is an anticoagulant medicine. It is one of a class of anticoagulants called low molecular weight heparin. Many people call these medicines blood thinners. They don't actually thin the blood, but they increase the time it takes a blood clot to form. This reduces the chance of a blood clot in the leg veins (deep vein thrombosis) or in the lungs (pulmonary embolism).  Enoxaparin is a shot (injection). You or someone caring for you will inject it once or twice a day. Most people need shots for 5 to 10 days, but in some cases it can be longer. Your doctor will tell you how long you need to have the shots.  Enoxaparin is used to:  ?? Treat deep vein thrombosis (DVT), which is a blood clot in the legs, pelvis, or arms.  ?? Reduce the chance of getting blood clots after certain surgeries. For example, you may take enoxaparin after knee or hip replacement surgery.  ?? Reduce the chance of getting blood clots in people who are likely to get them and who are not active for a long period of time. For example, you may need enoxaparin if you need to stay in bed for a long time because of a health problem.  ?? Reduce the chance of blood clots when another blood thinner is stopped for a short time. For example, if you take warfarin and need surgery, your doctor may ask you to stop taking warfarin for a short time before the surgery. You will take enoxaparin to help prevent blood clots before the surgery. After the surgery, your doctor will tell you when it is safe to start taking warfarin again. This is called bridge therapy.  Follow-up care is a key part of your treatment and safety. Be sure to make and go to all appointments,  and call your doctor if you are having problems. It's also a good idea to know your test results and keep a list of the medicines you take.  How can you care for yourself at home?  How to inject enoxaparin  You will get a prescription for prefilled syringes. Inject the medicine at the same time every day unless your doctor gives you other instructions.  1. Wash and dry your hands.  2. Sit or lie in a position that lets you see your belly.  3. Clean the injection site with an alcohol pad or swab, and let it dry. Choose a site on the right or left side of your belly, at least 2 inches from your belly button. Change the site each time you inject the medicine.  4. Remove the needle cap by pulling it straight off. Don't twist it.  5. Hold the syringe like a pencil in one hand. With the other hand, pinch an area of the injection site skin. You should have a "fold" in the skin.  6. Insert the entire needle straight down into the fold of skin. Don't insert the needle at an angle.  7. Press the plunger with your thumb until the syringe is empty.  8. Pull the needle straight out and let go of the skin.  9. Point the needle away from you and press down on  the plunger. The needle will be covered.  Take precautions  ?? Don't rub the injection site. This could cause bruising.  ?? Don't push air bubbles out of the syringe unless your doctor tells you to. Each syringe comes with air bubbles.  ?? Don't stop taking enoxaparin without talking to your doctor.  ?? If you are taking a blood thinner, be sure you get instructions about how to take your medicine safely. Blood thinners can cause serious bleeding problems.  ?? Talk to your doctor before you take any prescription medicines, over-the-counter medicines, antibiotics, vitamins, or herbal products.  ?? Don't take the following medicines unless your doctor says it's okay:  ?? Aspirin, products like aspirin (salicylates), or products that contain aspirin  ?? Nonsteroidal anti-inflammatory  drugs (NSAIDs), such as ibuprofen (Advil, Motrin) and naproxen (Aleve)  ?? Store enoxaparin at room temperature. Don't put it in the refrigerator or freezer.  When should you call for help?  Call 911 anytime you think you may need emergency care. For example, call if:  ?? You cough up blood.  ?? You vomit blood or what looks like coffee grounds.  ?? You pass maroon or very bloody stools.  Call your doctor now or seek immediate medical care if:  ?? You have new bruises that are away from the injection site or blood spots under your skin.  ?? You have a nosebleed.  ?? You have blood in your urine.  ?? Your stools are black and tarlike or have streaks of blood.  ?? You have vaginal bleeding when you are not having your period, or heavy period bleeding.  Watch closely for changes in your health, and be sure to contact your doctor if:  ?? You have questions about enoxaparin or your treatment.   Where can you learn more?   Go to [email or web address] and sign in to your MyChart account. Enter P266 in the Search Health Information box to learn more about ???Enoxaparin (Lovenox): After Your Visit.???    If you do not have an account, please click on the ???Sign Up Now??? link.     ?? 2006-2014 [employer]. Care instructions adapted under license by [facility]. This care instruction is for use with your licensed healthcare professional. If you have questions about a medical condition or this instruction, always ask your healthcare professional. [employer] disclaims any warranty or liability for your use of this information.  Content Version: 10.1.311062; Current as of: February 24, 2013

## 2013-10-26 MED ORDER — LANSOPRAZOLE 30 MG PO CPDR
30 MG | ORAL_CAPSULE | ORAL | Status: DC
Start: 2013-10-26 — End: 2014-05-11

## 2013-11-16 MED ORDER — METFORMIN HCL 500 MG PO TABS
500 MG | ORAL_TABLET | ORAL | Status: DC
Start: 2013-11-16 — End: 2014-03-29

## 2013-12-22 LAB — POCT PT/INR: INR,(POC): 1.1

## 2014-01-20 MED ORDER — ACYCLOVIR 400 MG PO TABS
400 MG | ORAL_TABLET | ORAL | Status: DC
Start: 2014-01-20 — End: 2014-03-29

## 2014-02-09 LAB — POCT PT/INR: INR,(POC): 1.1

## 2014-02-09 MED ORDER — WARFARIN SODIUM 5 MG PO TABS
5 MG | ORAL_TABLET | Freq: Every day | ORAL | Status: DC
Start: 2014-02-09 — End: 2014-11-23

## 2014-02-09 MED ORDER — WARFARIN SODIUM 5 MG PO TABS
5 MG | ORAL_TABLET | ORAL | Status: DC
Start: 2014-02-09 — End: 2014-02-09

## 2014-02-09 NOTE — Addendum Note (Signed)
Addended by: Joretta Bachelor A on: 02/09/2014 02:26 PM     Modules accepted: Orders

## 2014-02-23 MED ORDER — ONDANSETRON HCL 4 MG PO TABS
4 MG | ORAL_TABLET | ORAL | Status: DC
Start: 2014-02-23 — End: 2014-03-02

## 2014-02-23 NOTE — Telephone Encounter (Signed)
Pt informed to schedule appt

## 2014-02-24 MED ORDER — FOLBIC 2.5-25-2 MG PO TABS
ORAL_TABLET | ORAL | Status: DC
Start: 2014-02-24 — End: 2014-09-16

## 2014-02-28 MED ORDER — TRAMADOL HCL 50 MG PO TABS
50 MG | ORAL_TABLET | ORAL | Status: DC
Start: 2014-02-28 — End: 2014-08-05

## 2014-03-02 LAB — POCT PT/INR: INR,(POC): 1.2

## 2014-03-02 MED ORDER — COLESTIPOL HCL 1 G PO TABS
1 g | ORAL_TABLET | Freq: Two times a day (BID) | ORAL | Status: DC
Start: 2014-03-02 — End: 2015-09-26

## 2014-03-02 NOTE — Progress Notes (Signed)
Subjective:      Patient ID: Jasmine Strickland is a 43 y.o. female.    Diabetes  She presents for her follow-up diabetic visit. She has type 2 diabetes mellitus. There are no hypoglycemic associated symptoms. Associated symptoms include fatigue. There are no hypoglycemic complications. Risk factors for coronary artery disease include diabetes mellitus. Current diabetic treatment includes oral agent (monotherapy). She is compliant with treatment all of the time. She is following a generally healthy diet. When asked about meal planning, she reported none. She has not had a previous visit with a dietitian. She participates in exercise intermittently. Frequency home blood tests: checks occasionally. Her overall blood glucose range is 110-130 mg/dl. She does not see a podiatrist.Eye exam is not current.   sugars well controlled.  tol meds fairly well.  Will need INR again today.  Ongoing issues w/ nausea, pain and fatigue.  Some relief w/ antinausea medication.  Had cscope.  rec rpt in 5 yrs.  Nausea preceded metformin and cholecystectomy      Review of Systems   Constitutional: Positive for fatigue. Negative for fever.   Respiratory: Negative for shortness of breath.    Gastrointestinal: Positive for nausea and constipation (comes and goes).   Musculoskeletal: Positive for arthralgias.       Objective:   Physical Exam   Constitutional: She is oriented to person, place, and time. Vital signs are normal. She appears well-developed and well-nourished.   HENT:   Head: Normocephalic and atraumatic.   Right Ear: Hearing, tympanic membrane, external ear and ear canal normal.   Left Ear: Hearing, tympanic membrane, external ear and ear canal normal.   Nose: Nose normal.   Mouth/Throat: Uvula is midline, oropharynx is clear and moist and mucous membranes are normal.   Eyes: Conjunctivae and EOM are normal. Pupils are equal, round, and reactive to light.   Neck: Normal range of motion. Neck supple.   Cardiovascular: Normal rate,  regular rhythm, S1 normal, S2 normal and normal heart sounds.    Pulmonary/Chest: Effort normal and breath sounds normal. No accessory muscle usage. No respiratory distress.   Abdominal: Soft. Normal appearance and bowel sounds are normal. There is no hepatosplenomegaly. There is generalized tenderness. There is no rigidity.   Musculoskeletal: Normal range of motion.   Neurological: She is alert and oriented to person, place, and time.   Skin: Skin is warm, dry and intact.   Psychiatric: She has a normal mood and affect.   Vitals reviewed.  Diabetic Foot Exam    Exam: feet: warm, good capillary refill and normal monofilament exam    Diabetes Mellitus: well controlled      BP 116/68    Pulse 73    Ht 5\' 2"  (1.575 m)    Wt 170 lb (77.111 kg)    BMI 31.09 kg/m2      SpO2 97%     Assessment:      1. Type II or unspecified type diabetes mellitus without mention of complication, not stated as uncontrolled ([place])  Well controlled last check. Rpt labs today  - Hemoglobin A1C  - Lipid Panel  - Comprehensive Metabolic Panel  - Microalbumin / Creatinine Urine Ratio  - Diabetic Foot Exam    2. Hypothyroidism  Rpt labs today  - TSH without Reflex  - T4, free    3. Nausea  Ongoing issue.  Trial adding colestipol    4. Fatigue  Rpt labs today.    - Vitamin B12  - CBC  Auto Differential    5. Vitamin D deficiency  Was on supplementation in the past.  Rpt level  - VITAMIN D 25 HYDROXY    6. Protein S deficiency ([place])  inr low.  Inc dose as discussed.  Rpt in 2 weeks (5mg  m and f.  7.5mg  all others)  - POCT INR; Standing  - POCT INR    7. GERD (gastroesophageal reflux disease)  Cont meds    8. Encounter for monitoring coumadin therapy    - POCT INR; Standing  - POCT INR          Plan:

## 2014-03-03 LAB — CBC WITH AUTO DIFFERENTIAL
Basophils %: 0.6 %
Basophils Absolute: 0 10*3/uL (ref 0.0–0.2)
Eosinophils %: 1 %
Eosinophils Absolute: 0 10*3/uL (ref 0.0–0.6)
Hematocrit: 41.1 % (ref 36.0–48.0)
Hemoglobin: 13.7 g/dL (ref 12.0–16.0)
Lymphocytes %: 37.9 %
Lymphocytes Absolute: 1.6 10*3/uL (ref 1.0–5.1)
MCH: 31.2 pg (ref 26.0–34.0)
MCHC: 33.2 g/dL (ref 31.0–36.0)
MCV: 93.8 fL (ref 80.0–100.0)
MPV: 9.4 fL (ref 5.0–10.5)
Monocytes %: 6.1 %
Monocytes Absolute: 0.3 10*3/uL (ref 0.0–1.3)
Neutrophils %: 54.4 %
Neutrophils Absolute: 2.4 10*3/uL (ref 1.7–7.7)
Platelets: 268 10*3/uL (ref 135–450)
RBC: 4.39 M/uL (ref 4.00–5.20)
RDW: 13.6 % (ref 12.4–15.4)
WBC: 4.3 10*3/uL (ref 4.0–11.0)

## 2014-03-03 LAB — MICROALBUMIN / CREATININE URINE RATIO
Creatinine, Ur: 29.3 mg/dL (ref 28.0–259.0)
Microalbumin, Random Urine: <1.2 mg/dL (ref <2.0)

## 2014-03-03 LAB — COMPREHENSIVE METABOLIC PANEL
ALT: 10 U/L (ref 10–40)
AST: 14 U/L — ABNORMAL LOW (ref 15–37)
Albumin/Globulin Ratio: 1.9 (ref 1.1–2.2)
Albumin: 4.5 g/dL (ref 3.4–5.0)
Alkaline Phosphatase: 68 U/L (ref 40–129)
Anion Gap: 13 (ref 3–16)
BUN: 12 mg/dL (ref 7–20)
CO2: 25 mmol/L (ref 21–32)
Calcium: 9.6 mg/dL (ref 8.3–10.6)
Chloride: 104 mmol/L (ref 99–110)
Creatinine: 0.8 mg/dL (ref 0.6–1.1)
GFR African American: >60 (ref >60)
GFR Non-African American: >60 (ref >60)
Globulin: 2.4 g/dL
Glucose: 90 mg/dL (ref 70–99)
Potassium: 4.8 mmol/L (ref 3.5–5.1)
Sodium: 142 mmol/L (ref 136–145)
Total Bilirubin: 0.5 mg/dL (ref 0.0–1.0)
Total Protein: 6.9 g/dL (ref 6.4–8.2)

## 2014-03-03 LAB — HEMOGLOBIN A1C
Hemoglobin A1C: 5.4 %
eAG: 108.3 mg/dL

## 2014-03-03 LAB — LIPID PANEL
Cholesterol, Total: 191 mg/dL (ref 0–199)
HDL: 57 mg/dL (ref 40–60)
LDL Calculated: 117 mg/dL — ABNORMAL HIGH (ref <100)
Triglycerides: 86 mg/dL (ref 0–150)
VLDL Cholesterol Calculated: 17 mg/dL

## 2014-03-03 LAB — TSH: TSH: 0.47 u[IU]/mL (ref 0.27–4.20)

## 2014-03-03 LAB — VITAMIN B12: Vitamin B-12: >2000 pg/mL — ABNORMAL HIGH (ref 211–911)

## 2014-03-03 LAB — VITAMIN D 25 HYDROXY: Vit D, 25-Hydroxy: 40.3 ng/mL (ref >=30)

## 2014-03-03 LAB — T4, FREE: T4 Free: 1.9 ng/ml — ABNORMAL HIGH (ref 0.9–1.8)

## 2014-03-03 NOTE — Progress Notes (Signed)
Quick Note:    Urine for microalbumin ok. Sugar control excellent. Could go to once daily on metformin. b12 and vit d ok. Thyroid labs not bad. Lipids similar to prior.  ______

## 2014-03-15 MED ORDER — FLUCONAZOLE 150 MG PO TABS
150 MG | ORAL_TABLET | Freq: Once | ORAL | Status: AC
Start: 2014-03-15 — End: 2014-03-15

## 2014-03-15 NOTE — Telephone Encounter (Signed)
Diflucan 150mg po daily x 3 d

## 2014-03-15 NOTE — Telephone Encounter (Signed)
Do 1 day instead of 3 days.  Ok to send

## 2014-03-15 NOTE — Telephone Encounter (Signed)
Pt is requesting rx for diflucan for yeast infection, she has tried OTC products but they are not working.

## 2014-03-15 NOTE — Telephone Encounter (Signed)
There was a warning that popped up between coumadin and diflucan, states that it may cause bleeding, is it ok to send?

## 2014-03-15 NOTE — Telephone Encounter (Signed)
Pt advised, rx called in.

## 2014-03-29 LAB — POCT PT/INR: INR,(POC): 2.1

## 2014-03-29 MED ORDER — PHENTERMINE HCL 37.5 MG PO TABS
37.5 MG | ORAL_TABLET | Freq: Every day | ORAL | Status: AC
Start: 2014-03-29 — End: 2014-04-28

## 2014-03-29 MED ORDER — SUMATRIPTAN SUCCINATE 100 MG PO TABS
100 MG | ORAL_TABLET | Freq: Once | ORAL | Status: DC | PRN
Start: 2014-03-29 — End: 2015-07-18

## 2014-03-29 MED ORDER — TRAZODONE HCL 50 MG PO TABS
50 MG | ORAL_TABLET | Freq: Every evening | ORAL | Status: DC
Start: 2014-03-29 — End: 2014-03-31

## 2014-03-29 NOTE — Progress Notes (Signed)
Subjective:      Patient ID: Jasmine Strickland is a 43 y.o. female.    HPI  Pt wants to talk about weight loss.  Also pt not sleeping.   Has been using benadryl and tylenol PM but pt still waking up at 4 a.m.  Some nights pt is unable to go to sleep.    Used adipex in past (several yrs ago).  Weight has plateaued.  Diff losing additional weight.  Measuring food.  Has eliminated sugars.  occas honey.  Drinks water.    Pt had been on imitrex previously for migraines - would like rx.     Sleep issues ongoing.  Worsening pain this time of the yr as well.  Diff falling asleep and staying asleep.  Has tried otc sleep aids  Benadryl w/ some mild relief initially.  Migraines and fibromyalgia sxs worse over similar period of time.  Tried sleep meds in the past.    Review of Systems   Constitutional: Negative for fever and unexpected weight change.   Respiratory: Negative for shortness of breath.    Musculoskeletal: Positive for myalgias.   Neurological: Positive for headaches.       Objective:   Physical Exam   Constitutional: She is oriented to person, place, and time. Vital signs are normal. She appears well-developed and well-nourished.   HENT:   Head: Normocephalic and atraumatic.   Right Ear: Hearing, tympanic membrane, external ear and ear canal normal.   Left Ear: Hearing, tympanic membrane, external ear and ear canal normal.   Nose: Nose normal.   Mouth/Throat: Uvula is midline, oropharynx is clear and moist and mucous membranes are normal.   Eyes: Conjunctivae and EOM are normal. Pupils are equal, round, and reactive to light.   Neck: Normal range of motion. Neck supple.   Cardiovascular: Normal rate, regular rhythm, S1 normal, S2 normal and normal heart sounds.    Pulmonary/Chest: Effort normal and breath sounds normal. No accessory muscle usage.   Abdominal: Normal appearance. There is no hepatosplenomegaly. There is no rigidity.   Musculoskeletal: Normal range of motion.   Neurological: She is alert and oriented  to person, place, and time.   Skin: Skin is warm, dry and intact.   Psychiatric: She has a normal mood and affect.   Vitals reviewed.    BP 108/64    Pulse 78    Ht 5\' 2"  (1.575 m)    Wt 173 lb (78.472 kg)    BMI 31.63 kg/m2      SpO2 96%     Assessment:      1. Fibromyalgia  Ongoing issues.  Worse than prior.  ? Related to weather or sleep.  See below    2. Protein S deficiency ([place])  inr at goal. Rpt in 3 weeks  - POCT INR    3. Encounter for monitoring coumadin therapy  See above  - POCT INR    4. Obesity  Discussed meds.  Will try again.  Weight check in 1 mo.  Has used in the past and aware of risks and benefits  - phentermine (ADIPEX-P) 37.5 MG tablet; Take 1 tablet by mouth every morning (before breakfast) for 30 days.  Dispense: 30 tablet; Refill: 0    5. Migraine  Refill.  May be related to sleep  - SUMAtriptan (IMITREX) 100 MG tablet; Take 1 tablet by mouth once as needed for Migraine for up to 1 dose.  Dispense: 9 tablet; Refill: 3  6. Insomnia  Trial of trazodone.  Given info re belsomra.  Considered amitrip, but potential rxn w/ ultram          Plan:

## 2014-03-31 MED ORDER — AMITRIPTYLINE HCL 25 MG PO TABS
25 MG | ORAL_TABLET | Freq: Every evening | ORAL | Status: DC
Start: 2014-03-31 — End: 2014-04-16

## 2014-03-31 NOTE — Telephone Encounter (Signed)
Potential interaction w/ ultram, but risk low given just taking low dose at bedtime

## 2014-03-31 NOTE — Telephone Encounter (Signed)
Pt called said the Trazodone is not helping her sleep and she is having body aches most of the next day.

## 2014-03-31 NOTE — Telephone Encounter (Signed)
Stop trazodone.  Call in amitrip 25mg  po qhs #30, no rf

## 2014-04-04 NOTE — Telephone Encounter (Signed)
Hesitant to inc dose due to risk of interaction w/ ultram at higher doses.  Monitor for now

## 2014-04-04 NOTE — Telephone Encounter (Signed)
Pt called c/o that the Amitriptyline is not helping her stay asleep, she wakes several times during the night

## 2014-04-05 NOTE — Telephone Encounter (Signed)
Discussed with pt.

## 2014-04-11 MED ORDER — LEVOTHYROXINE SODIUM 50 MCG PO TABS
50 MCG | ORAL_TABLET | ORAL | Status: DC
Start: 2014-04-11 — End: 2014-11-14

## 2014-04-15 MED ORDER — PROPRANOLOL HCL 40 MG PO TABS
40 MG | ORAL_TABLET | ORAL | Status: DC
Start: 2014-04-15 — End: 2014-05-14

## 2014-04-16 ENCOUNTER — Inpatient Hospital Stay: Admit: 2014-04-16 | Discharge: 2014-04-16 | Attending: Emergency Medicine

## 2014-04-16 LAB — URINALYSIS
Bilirubin Urine: NEGATIVE
Blood, Urine: NEGATIVE
Glucose, Ur: NEGATIVE mg/dL
Ketones, Urine: NEGATIVE mg/dL
Leukocyte Esterase, Urine: NEGATIVE
Nitrite, Urine: NEGATIVE
Protein, UA: NEGATIVE mg/dL
Specific Gravity, UA: <=1.005 (ref 1.005–1.030)
Urobilinogen, Urine: 0.2 E.U./dL (ref <2.0)
pH, UA: 6 (ref 5.0–8.0)

## 2014-04-16 LAB — PROTIME-INR
INR: 1.29 — ABNORMAL HIGH (ref 0.85–1.16)
Protime: 14.1 s — ABNORMAL HIGH (ref 9.1–12.6)

## 2014-04-16 LAB — APTT: aPTT: 33.5 s (ref 23.1–36.1)

## 2014-04-16 MED ADMIN — ondansetron (ZOFRAN) injection 4 mg: 4 mg | INTRAVENOUS | @ 07:00:00 | NDC 00703722101

## 2014-04-16 MED ADMIN — HYDROmorphone HCl PF (DILAUDID) injection 2 mg: 2 mg | INTRAMUSCULAR | @ 07:00:00 | NDC 00409128331

## 2014-04-16 MED FILL — ONDANSETRON HCL 4 MG/2ML IJ SOLN: 4 MG/2ML | INTRAMUSCULAR | Qty: 2

## 2014-04-16 MED FILL — HYDROMORPHONE HCL PF 1 MG/ML IJ SOLN: 1 MG/ML | INTRAMUSCULAR | Qty: 2

## 2014-04-16 NOTE — Discharge Instructions (Signed)
IMPORTANT:  If you have any trouble getting in to see the physician that we have referred you to today, please call the [facility] at [phone].  Please leave a voice message if they are unavailable and they will return your call.    If you were prescribed an outpatient test, please call [facility] at [phone] to schedule an appointment for your test that was ordered.       ADDITIONAL INSTRUCTIONS FOR ALL PATIENTS:  -If you have been prescribed an antibiotic TAKE IT AS DIRECTED UNTIL IT IS ALL FINISHED.  -If you HAVE RECEIVED OR BEEN PRESCRIBED A MEDICATION THAT MAY CAUSE DROWSINESS. DO NOT DRIVE, DRINK ALCOHOL, OR OPERATE MACHINERY THAT REQUIRES YOU TO BE ALERT.    -If you had an EKG and/or X-Ray reading made in the Emergency Department, it will be reviewed by a Cardiologist and/or Radiologist. If the review changes your diagnosis, you will be contacted.  -If you had a specimen collected for culture, a CULTURE REPORT takes 48-72 hours to generate: You will be contacted if a change in treatment is needed.    -Return if your condition worsens or if you have severe pain, worsening of symptoms such as fever, vomiting or difficulty breathing.    If you have been prescribed an opiate or other controlled substance (see lists below):    [place] may have been prescribed a medication that is a controlled substance.  Controlled substances include pain medications known as opiates and sedative nerve medications known as benzodiazepines.    Some common opiates include:    Codeine (such as Tylenol #3)  Hydrocodone (Vicodin, Lortab, Lorcet, Norco)  Oxycodone (Percocet, Percodan, Oxycodone, Oxy IR)    Some common benzodiazepines include:    Diazepam (Valium)  Lorazepam (Ativan)  Alprazolam (Xanax)  Clonazepam (Klonopin)  Oxazepam (Serax)    All of these controlled substances are highly addictive and frequently abused.  Misuse can and frequently does  lead to addiction as well as overdose and death.  Short term supplies, 3 days or less, are prescribed because of the highly addictive nature of the medication.  Any of the controlled substance medication NOT taken should be disposed of properly and NOT SAVED.  The recommended method of disposing of unused medications is:     - Place the medicines in a sealable plastic bag.  If the medicine is a solid, crush it or add water to dissolve it.  - Add something undesirable (cat litter, coffee grounds, etc.)  - Dispose of sealed bag in household trash  - Do not flush or pour unused medicines down a sink or drain.    Also, because of the addictive nature and frequent abuse, these medications are sometimes stolen.  These medications should be kept in a safe place where they cannot be stolen.  Do not keep them in your car or purse.  Lost or stolen prescriptions for controlled substances WILL NOT BE REFILLED in this emergency department, regardless of whether a police report was filed.            Headache: After Your Visit  Your Care Instructions     Headaches have many possible causes. Most headaches aren't a sign of a more serious problem, and they will get better on their own. Home treatment may help you feel better faster.  The doctor has checked you carefully, but problems can develop later. If you notice any problems or new symptoms, get  medical treatment right away.  Follow-up care is a key part of your treatment and safety. Be sure to make and go to all appointments, and call your doctor if you are having problems. It's also a good idea to know your test results and keep a list of the medicines you take.  How can you care for yourself at home?   Do not drive if you have taken a prescription pain medicine.   Rest in a quiet, dark room until your headache is gone. Close your eyes and try to relax or go to sleep. Don't watch TV or read.   Put a cold, moist cloth or cold pack on the painful area for 10 to 20 minutes at  a time. Put a thin cloth between the cold pack and your skin.   Use a warm, moist towel or a heating pad set on low to relax tight shoulder and neck muscles.   Have someone gently massage your neck and shoulders.   Take pain medicines exactly as directed.   If the doctor gave you a prescription medicine for pain, take it as prescribed.   If you are not taking a prescription pain medicine, ask your doctor if you can take an over-the-counter medicine.   Be careful not to take pain medicine more often than the instructions allow, because you may get worse or more frequent headaches when the medicine wears off.   Do not ignore new symptoms that occur with a headache, such as a fever, weakness or numbness, vision changes, or confusion. These may be signs of a more serious problem.  To prevent headaches   Keep a headache diary so you can figure out what triggers your headaches. Avoiding triggers may help you prevent headaches. Record when each headache began, how long it lasted, and what the pain was like (throbbing, aching, stabbing, or dull). Write down any other symptoms you had with the headache, such as nausea, flashing lights or dark spots, or sensitivity to bright light or loud noise. Note if the headache occurred near your period. List anything that might have triggered the headache, such as certain foods (chocolate, cheese, wine) or odors, smoke, bright light, stress, or lack of sleep.   Find healthy ways to deal with stress. Headaches are most common during or right after stressful times. Take time to relax before and after you do something that has caused a headache in the past.   Try to keep your muscles relaxed by keeping good posture. Check your jaw, face, neck, and shoulder muscles for tension, and try relaxing them. When sitting at a desk, change positions often, and stretch for 30 seconds each hour.   Get plenty of sleep and exercise.   Eat regularly and well. Long periods without food can  trigger a headache.   Treat yourself to a massage. Some people find that regular massages are very helpful in relieving tension.   Limit caffeine by not drinking too much coffee, tea, or soda. But don't quit caffeine suddenly, because that can also give you headaches.   Reduce eyestrain from computers by blinking frequently and looking away from the computer screen every so often. Make sure you have proper eyewear and that your monitor is set up properly, about an arm's length away.   Seek help if you have depression or anxiety. Your headaches may be linked to these conditions. Treatment can both prevent headaches and help with symptoms of anxiety or depression.  When should you call for  help?  Call 911 anytime you think you may need emergency care. For example, call if:   You have signs of a stroke. These may include:   Sudden numbness, paralysis, or weakness in your face, arm, or leg, especially on only one side of your body.   Sudden vision changes.   Sudden trouble speaking.   Sudden confusion or trouble understanding simple statements.   Sudden problems with walking or balance.   A sudden, severe headache that is different from past headaches.  Call your doctor now or seek immediate medical care if:   You have a new or worse headache.   Your headache gets much worse.   Where can you learn more?   Go to [email or web address] and sign in to your MyChart account. Enter M271 in the [place] box to learn more about "Headache: After Your Visit."    If you do not have an account, please click on the "Sign Up Now" link.      2006-2015 [employer]. Care instructions adapted under license by [facility]. This care instruction is for use with your licensed healthcare professional. If you have questions about a medical condition or this instruction, always ask your healthcare professional. [place] any warranty or liability for your  use of this information.  Content Version: 10.4.390249; Current as of: February 24, 2013

## 2014-04-16 NOTE — ED Notes (Signed)
Pt sts she thinks she now is having migraine. Pt reports hx of migraines    Helyn App, RN  04/16/14 [id number]

## 2014-04-16 NOTE — ED Provider Notes (Signed)
Jasmine Strickland is a 43 y.o. female who presents to the Emergency Department for evaluation of headache. Pt reports experiencing a  headache that began this morning she states she awoke having dizziness and nausea and took a pill for both of these things that she has had them before.  He states she did not have significant headache until later tonight after she actually came to the emergency department thinking she had a urinary tract infection.. Pt reports taking medication for nausea and dizziness and that things didn't improve although did not completely resolve.  Pt states her headache came on again at approximately 10 pm this evening. Pt also reports experiencing a pressure after urination which she reports is typical presentation for onset of UTI. Pt is currently on coumadin.  She states her primary care physician monitors and doses her Coumadin.  She states that that had difficulty regulating it.  She states last time it was checked within the last couple of weeks it was in a good range.    Patient is a 43 y.o. female presenting with headaches. The history is provided by the patient.   Headache  Radiates to:  Does not radiate  Onset quality:  Sudden  Duration:  5 hours  Timing:  Constant  Chronicity:  Recurrent  Similar to prior headaches: yes    Relieved by:  Nothing  Associated symptoms: photophobia    Associated symptoms: no abdominal pain, no back pain, no diarrhea, no ear pain, no fever, no nausea and no sore throat        Review of Systems   Constitutional: Negative for fever, chills and activity change.   HENT: Negative for ear pain and sore throat.    Eyes: Positive for photophobia. Negative for redness.   Respiratory: Negative for chest tightness and shortness of breath.    Cardiovascular: Negative for chest pain.   Gastrointestinal: Negative for nausea, abdominal pain and diarrhea.   Genitourinary: Positive for dysuria.   Musculoskeletal: Negative for back pain.   Skin: Negative for color  change and rash.   Neurological: Positive for headaches. Negative for syncope.   Psychiatric/Behavioral: Negative for confusion.           PAST MEDICAL HISTORY   has a past medical history of Asthma; Pleurisy; GERD (gastroesophageal reflux disease); Protein S deficiency ([place]); Pulmonary embolism ([place]); Migraine; Diabetes mellitus ([place]); Fibromyalgia; Type II or unspecified type diabetes mellitus without mention of complication, not stated as uncontrolled ([place]); and Hypothyroidism.    PAST SURGICAL HISTORY   has past surgical history that includes pelvic laparoscopy; other surgical history (12/23/2012); Cholecystectomy; and Endometrial ablation.    FAMILY HISTORY  family history is not on file.    SOCIAL HISTORY   reports that she has never smoked. She has never used smokeless tobacco. She reports that she does not drink alcohol or use illicit drugs.    HOME MEDICATIONS     Prior to Admission medications    Medication Sig Start Date End Date Taking? Authorizing Provider   SUMAtriptan (IMITREX) 100 MG tablet Take 100 mg by mouth as needed for Migraine   Yes Historical Provider, MD   propranolol (INDERAL) 40 MG tablet TAKE ONE TABLET BY MOUTH EVERY DAY 04/15/14   Margaretmary Dys, MD   levothyroxine (SYNTHROID) 50 MCG tablet TAKE ONE TABLET BY MOUTH DAILY 04/09/14   Providence Crosby, CNP   metFORMIN (GLUCOPHAGE) 500 MG tablet Take 500 mg by mouth daily (with breakfast).    Historical Provider,  MD   phentermine (ADIPEX-P) 37.5 MG tablet Take 1 tablet by mouth every morning (before breakfast) for 30 days. 03/29/14 04/28/14  Mirna Miresyler Campbell, MD   colestipol (COLESTID) 1 G tablet Take 1 tablet by mouth 2 times daily. 03/02/14   Mirna Miresyler Campbell, MD   traMADol (ULTRAM) 50 MG tablet TAKE ONE TABLET BY MOUTH EVERY TWELVE (12) HOURS AS NEEDED FOR PAIN 02/26/14   Mirna Miresyler Campbell, MD   [facility] 2.[facility]25[facility]2 MG TABS TAKE ONE TABLET BY MOUTH EVERY DAY 02/24/14   Mirna Miresyler Campbell, MD   warfarin (COUMADIN) 5 MG tablet Take 1 tablet by mouth daily for  60 doses. USE AS DIRECTED.  ALTERNATES 7.5 AND 5MG  02/09/14 04/10/14  Mirna Miresyler Campbell, MD   lansoprazole (PREVACID) 30 MG capsule TAKE ONE CAPSULE BY MOUTH DAILY 10/26/13   Mirna Miresyler Campbell, MD   aspirin EC 81 MG EC tablet Take 81 mg by mouth daily.      Historical Provider, MD   warfarin (COUMADIN) 1 MG tablet   Take 5 mg by mouth daily at 1800 Pt takes 7.5 mg every day except Monday and Thursday she takes 5 mg.    Historical Provider, MD        ALLERGIES  is allergic to phenergan; gluten meal; lactose; orange syrup; tomato; codeine; and hydrocodone.       BP 127/86    Pulse 95    Temp(Src) 97.8 ??F (36.6 ??C) (Oral)    Resp 18    Ht 5\' 2"  (1.575 m)    Wt 170 lb (77.111 kg)    BMI 31.09 kg/m2      SpO2 99%    LMP 03/23/2014       Physical Exam   Constitutional: She is oriented to person, place, and time. She appears well-developed. No distress.   HENT:   Head: Normocephalic and atraumatic.   Right Ear: External ear normal.   Left Ear: External ear normal.   Mouth/Throat: Oropharynx is clear and moist. No oropharyngeal exudate, posterior oropharyngeal edema or posterior oropharyngeal erythema.   Eyes: Conjunctivae and EOM are normal. Pupils are equal, round, and reactive to light. Right eye exhibits no discharge. Left eye exhibits no discharge.   Fundoscopic exam:       The right eye shows no hemorrhage and no papilledema.        The left eye shows no hemorrhage and no papilledema.   Neck: Normal range of motion. Neck supple. No JVD present.   Cardiovascular: Normal rate, regular rhythm, normal heart sounds and intact distal pulses.  Exam reveals no gallop and no friction rub.    No murmur heard.  Pulmonary/Chest: Effort normal and breath sounds normal. No stridor. No respiratory distress. She has no wheezes. She has no rales. She exhibits no tenderness.   Abdominal: Soft. Bowel sounds are normal. She exhibits no distension and no mass. There is no tenderness. There is no rigidity, no rebound and no guarding.    Musculoskeletal: Normal range of motion. She exhibits no edema or tenderness.   Neurological: She is alert and oriented to person, place, and time. She has normal strength. She displays normal reflexes. No cranial nerve deficit or sensory deficit. She exhibits normal muscle tone. Coordination normal. GCS eye subscore is 4. GCS verbal subscore is 5. GCS motor subscore is 6.   Skin: Skin is warm and dry. No rash noted. She is not diaphoretic.   Psychiatric: She has a normal mood and affect. Her behavior is normal.   Nursing  note and vitals reviewed.  Cranial facial musculature and sensation are grossly intact the pt has from of neck with no nuchal rigidity or meningismus. Pt has intact finger to nose and intact visual fields grossly intact bilaterally. pt has intact rapid alternating movements bilaterally.. Pt is able to walk on heels and toes and to tandem walk without difficulty or ataxia and is able to stand with upper extremities extended and supinated without pronation or drift      Procedures    MDM  Number of Diagnoses or Management Options     Amount and/or Complexity of Data Reviewed  Clinical lab tests: ordered and reviewed  Tests in the radiology section of CPT??: ordered and reviewed  Review and summarize past medical records: yes  Independent visualization of images, tracings, or specimens: yes        Labs  Results for orders placed during the hospital encounter of 04/16/14   URINALYSIS       Result Value Range    Color, UA Yellow  Straw/Yellow    Clarity, UA Clear  Clear    Glucose, Ur Negative  Negative mg/dL    Bilirubin Urine Negative  Negative    Ketones, Urine Negative  Negative mg/dL    Specific Gravity, UA <=1.005  1.005-1.030    Blood, Urine Negative  Negative    pH, UA 6.0  5.0-8.0    Protein, UA Negative  Negative mg/dL    Urobilinogen, Urine 0.2  <2.0 E.U./dL    Nitrite, Urine Negative  Negative    Leukocyte Esterase, Urine Negative  Negative    Microscopic Examination Not Indicated       Urine Type Not Specified     PROTIME-INR       Result Value Range    Protime 14.1 (*) 9.1 - 12.6 sec    INR 1.29 (*) 0.85 - 1.16   APTT       Result Value Range    aPTT 33.5  23.1 - 36.1 sec         Radiology    Radiologist interpretation of CT:  Head:   1. Mildly limited exam, with no acute intracranial process identified.          Emergency Department Course:    4:13 AM  She feels much improved following the Dilaudid.  I did discuss with her the INR and that was subtherapeutic.  I offered to call her physician but she prefers to call this morning on her own.  He is comfortable with the idea of being discharged home.  She has had no chest pain no shortness of breath.  She states they have been modifying her Coumadin doses frequently.  She appears much improved voiced understanding of all instructions my suspicion for an acute intracranial process is low at this time.  She understands the need to return for any problems she voiced understanding of all instructions is agreeable to plan and is discharged in good condition.  Discussed results, diagnosis and plan with patient and family.  Questions addressed.  Disposition and follow-up agreed upon.  Specific discharge instructions explained, including reasons to return to the emergency department.    I estimate there is LOW risk for SUBARACHNOID HEMORRHAGE, MENINGITIS, INTRACRANIAL HEMORRHAGE, SUBDURAL OR EPIDURAL HEMATOMA, OR STROKE, thus I consider the discharge disposition reasonable. The patient and/or family and I have discussed the diagnosis and risks, and we agree with discharging home to follow-up with their primary doctor. We also discussed returning to the Emergency  Department immediately if new or worsening symptoms occur. We have discussed the symptoms which are most concerning (e.g., changing or worsening pain, weakness, vomiting, fever) that necessitate immediate return.        This document serves as a record of the services and decisions personally  performed by Mamie Laurel, MD. It was created on the provider's behalf by Gayla Medicus, a trained medical scribe. The creation of this record is based on the provider's statements to the medical scribe. The document has been checked and approved by the provider.    Please note that some or all of this chart was generated using [place] voice recognition software. Although every effort was made to ensure the accuracy of this automated transcription, some errors in transcription may have occurred.             Mamie Laurel, MD  04/16/14 [phone]

## 2014-04-16 NOTE — ED Notes (Signed)
IV removed dressing applied no bruising or bleeding at sight.    Orson Eva, RN  04/16/14 [phone]

## 2014-04-16 NOTE — ED Notes (Signed)
[name] is a 43 y.o. female who had concerns including Migraine.    Chief Complaint   Patient presents with   ??? Migraine     h/a and dizziness- has a history of dizziness and migraine, feel like I have a UTI also.       She is being discharged with the diagnosis of:    1. Cephalgia        Her abnormal labs were:    Labs Reviewed   PROTIME-INR - Abnormal; Notable for the following:     Protime 14.1 (*)     INR 1.29 (*)     All other components within normal limits   URINALYSIS   APTT       Her vital signs for the encounter were:    Patient Vitals for the past 24 hrs:   BP Temp Temp src Pulse Resp SpO2 Height Weight   04/16/14 0429 119/85 mmHg - - 83 16 99 % - -   04/16/14 0107 127/86 mmHg 97.8 ??F (36.6 ??C) Oral 95 18 99 % 5\' 2"  (1.575 m) 170 lb (77.111 kg)       We administered to her the following medications:    Medications   ondansetron (ZOFRAN) injection 4 mg (4 mg Intravenous Given 04/16/14 0304)   HYDROmorphone HCl PF (DILAUDID) injection 2 mg (2 mg Intramuscular Given 04/16/14 0256)       She had the following images with impressions:    No results found.    She is being discharged with the following medications:    New Prescriptions    No medications on file       [name] left this ED in good condition and verbalized understanding of discharge instructions and medications      Orson Eva, RN  04/16/14 [phone]

## 2014-04-26 MED ORDER — ACYCLOVIR 400 MG PO TABS
400 MG | ORAL_TABLET | ORAL | Status: DC
Start: 2014-04-26 — End: 2014-05-02

## 2014-04-29 LAB — POCT PT/INR: INR,(POC): 2.8

## 2014-04-29 MED ORDER — PHENTERMINE HCL 37.5 MG PO CAPS
37.5 MG | ORAL_CAPSULE | Freq: Every morning | ORAL | Status: AC
Start: 2014-04-29 — End: 2014-05-29

## 2014-05-01 MED ORDER — PHENTERMINE HCL 37.5 MG PO TABS
37.5 MG | ORAL_TABLET | Freq: Every day | ORAL | Status: AC
Start: 2014-05-01 — End: 2014-05-29

## 2014-05-02 MED ORDER — ACYCLOVIR 400 MG PO TABS
400 MG | ORAL_TABLET | ORAL | Status: DC
Start: 2014-05-02 — End: 2014-06-07

## 2014-05-11 MED ORDER — LANSOPRAZOLE 30 MG PO CPDR
30 MG | ORAL_CAPSULE | ORAL | Status: DC
Start: 2014-05-11 — End: 2014-12-21

## 2014-05-16 MED ORDER — PROPRANOLOL HCL 40 MG PO TABS
40 MG | ORAL_TABLET | ORAL | Status: DC
Start: 2014-05-16 — End: 2014-09-16

## 2014-06-07 MED ORDER — ACYCLOVIR 400 MG PO TABS
400 MG | ORAL_TABLET | ORAL | Status: DC
Start: 2014-06-07 — End: 2014-08-05

## 2014-06-10 MED ORDER — PHENTERMINE HCL 37.5 MG PO TABS
37.5 MG | ORAL_TABLET | Freq: Every day | ORAL | Status: DC
Start: 2014-06-10 — End: 2016-07-30

## 2014-06-10 NOTE — Progress Notes (Signed)
Refill request for Adipex

## 2014-07-22 LAB — BASIC METABOLIC PANEL
Anion Gap: 16 mmol/L (ref 8–16)
BUN: 14 mg/dL (ref 5–19)
CO2: 25 mmol/L (ref 21–33)
Calcium: 9 mg/dL (ref 8.4–10.2)
Chloride: 104 mmol/L (ref 99–111)
Creatinine: 0.9 mg/dL (ref 0.3–1.5)
Gfr Calculated: 69
POC Glucose: 108 MG/DL — ABNORMAL HIGH (ref 74–99)
Potassium: 3.6 MMOL/L (ref 3.4–5.0)
Sodium: 141 MMOL/L (ref 136–146)

## 2014-07-22 LAB — CBC WITH AUTO DIFFERENTIAL
Basophils %: 0.4 % (ref 0–1)
Eosinophils %: 0.5 % (ref 0–5)
Erythrocyte Distribution Width RBC Ratio: 13.2 % (ref 11.6–14.8)
Granulocyte Absolute Count: 5.5 10*3/uL
Hematocrit: 40.1 % (ref 35.7–46.7)
Hemoglobin: 13.4 G/DL (ref 11.9–15.9)
Lymphocytes %: 21.6 % (ref 15–45)
Lymphocytes Absolute: 1.7 10*3/uL
MCH: 31.3 PG (ref 28.1–33.6)
MCHC: 33.4 G/DL (ref 32.8–34.7)
MCV: 93.7 FL (ref 82.9–99.9)
Monocytes %: 6.6 % (ref 0–12)
Neutrophils/100 leukocytes: 70.9 % — ABNORMAL HIGH (ref 40–70)
Platelets: 275 10*3 (ref 175–420)
RBC: 4.28 x1000000 (ref 3.72–5.31)
WBC: 7.7 10*3 (ref 4.5–10.3)

## 2014-07-22 LAB — HCG, SERUM, QUALITATIVE: HCG(Serum) Pregnancy Test: NEGATIVE

## 2014-07-22 LAB — PROTIME
Coag Tissue Factor Induced Blood Time Pt. Coag: 13.2 SECS. — ABNORMAL HIGH (ref 9.4–12.2)
INR: 1.3 — ABNORMAL LOW (ref 2.00–4.00)

## 2014-07-22 LAB — PTT: aPTT: 28.6 SECS. (ref 24.6–30.5)

## 2014-07-22 NOTE — Progress Notes (Signed)
Quick Note:    inr low. Looks like was seen in ER  ______

## 2014-07-26 LAB — BLOOD GAS, ARTERIAL
Base Excess, Arterial: -0.5 meq/L (ref <=2.0)
CO2: 36.6 mm??????Hg?????? (ref 35.0–45.0)
HCO3: 23.5 meq/L (ref 22.0–26.0)
Inhaled O2 Flow Rate: 2 L/min
O2 Sat: 96.9 % (ref 96.0–97.0)
Oxygen Blood Pressure: 94.2 mm??????Hg?????? (ref 80.0–95.0)
PH, Art: 7.43 (ref 7.35–7.45)
pCO2, Arterial: 24.7 meq/L (ref 21.0–33.0)

## 2014-07-26 LAB — LIPID PANEL
Cholesterol, Total: 153 mg/dL (ref 0–200)
HDL: 42 MG/DL (ref 18–88)
LDL Direct: 75.8 MG/DL
LDL/HDL Ratio: 1.8
Triglycerides: 176 MG/DL — ABNORMAL HIGH (ref 33–163)
VLDL: 35.2 MG/DL (ref 10–50)

## 2014-07-26 LAB — CBC WITH AUTO DIFFERENTIAL
Basophils %: 0.3 % (ref 0–1)
Eosinophils %: 1.6 % (ref 0–5)
Erythrocyte Distribution Width RBC Ratio: 13.2 % (ref 11.6–14.8)
Granulocyte Absolute Count: 4.2 10*3/uL
Hematocrit: 36.4 % (ref 35.7–46.7)
Hemoglobin: 12.2 G/DL (ref 11.9–15.9)
Lymphocytes %: 25.3 % (ref 15–45)
Lymphocytes Absolute: 1.7 10*3/uL
MCH: 31.4 PG (ref 28.1–33.6)
MCHC: 33.5 G/DL (ref 32.8–34.7)
MCV: 93.8 FL (ref 82.9–99.9)
Monocytes %: 10.6 % (ref 0–12)
Neutrophils/100 leukocytes: 62.2 % (ref 40–70)
Platelets: 257 10*3 (ref 175–420)
RBC: 3.88 x1000000 (ref 3.72–5.31)
WBC: 6.8 10*3 (ref 4.5–10.3)

## 2014-07-26 LAB — BASIC METABOLIC PANEL
Anion Gap: 12.9 mmol/L (ref 8–16)
BUN: 9 mg/dL (ref 5–19)
CO2: 25 mmol/L (ref 21–33)
Calcium: 8.7 mg/dL (ref 8.4–10.2)
Chloride: 107 mmol/L (ref 99–111)
Creatinine: 1 mg/dL (ref 0.3–1.5)
Gfr Calculated: 61
POC Glucose: 101 MG/DL — ABNORMAL HIGH (ref 74–99)
Potassium: 3.7 MMOL/L (ref 3.4–5.0)
Sodium: 141 MMOL/L (ref 136–146)

## 2014-07-26 LAB — TROPONIN I, 0 HOUR
Troponin I: <0.017 NG/ML (ref 0.00–0.05)
Troponin I: <0.017 NG/ML (ref 0.00–0.05)
Troponin I: <0.017 NG/ML (ref 0.00–0.05)
Troponin I: <0.017 NG/ML (ref 0.00–0.05)

## 2014-07-26 LAB — PTT: aPTT: 30.5 SECS. (ref 24.6–30.5)

## 2014-07-26 LAB — BNP: BNP: 18 pg/mL (ref 0–100)

## 2014-07-26 LAB — PROTIME
Coag Tissue Factor Induced Blood Time Pt. Coag: 14.1 SECS. — ABNORMAL HIGH (ref 9.4–12.2)
INR: 1.39 — ABNORMAL LOW (ref 2.00–4.00)

## 2014-07-26 LAB — CK: Total CK: 43 ??????iU??????/L (ref 0–160)

## 2014-07-26 LAB — D-DIMER, QUANTITATIVE: D-Dimer, Quant: <0.19 MG/L FEU (ref 0.0–0.49)

## 2014-07-26 LAB — CK-MB: CK-MB: <0.5 ng/mL (ref 0.0–3.6)

## 2014-08-03 NOTE — Progress Notes (Signed)
Quick Note:    Stress test negative    ______

## 2014-08-05 LAB — POCT PT/INR: INR,(POC): 1.2

## 2014-08-05 MED ORDER — ONDANSETRON HCL 4 MG PO TABS
4 MG | ORAL_TABLET | ORAL | Status: DC
Start: 2014-08-05 — End: 2014-12-21

## 2014-08-05 MED ORDER — TRAMADOL HCL 50 MG PO TABS
50 MG | ORAL_TABLET | ORAL | Status: DC
Start: 2014-08-05 — End: 2014-10-21

## 2014-08-05 NOTE — Patient Instructions (Addendum)
Patient to take coumadin 10 mg today and then 7.5 mg daily and repeat INR in one week. Stop metformin and start Invokana 100 mg by mouth daily with breakfast. F/u in office every 90 days.     tramadol  Pronunciation: TRAM a dol  Brand: ConZip, Rybix ODT, Ryzolt, Ultram, Ultram ER  What is tramadol?  Tramadol is a narcotic-like pain reliever.  Tramadol is used to treat moderate to severe pain.  The extended-release form of this medicine is for around-the-clock treatment of pain. This form of tramadol is not  for use on an as-needed basis for pain.  Tramadol may also be used for purposes not listed in this medication guide.  What should I discuss with my healthcare provider before taking tramadol?  You should not take tramadol if you are allergic to it, or if you have:  ?? severe asthma or breathing problems;  ?? a blockage in your stomach or intestines;  ?? if you have recently used alcohol, sedatives, tranquilizers, or narcotic medications.  Seizures have occurred in some people taking tramadol. Talk with your doctor about your seizure risk, which may be higher if you have:  ?? a history of head injury, epilepsy or other seizure disorder;  ?? a history of drug or alcohol addiction;  ?? a metabolic disorder; or  ?? if you are also using certain medicines to treat migraine headaches, muscle spasms, depression, mental illness, or nausea and vomiting.  To make sure tramadol is safe for you, tell your doctor if you have:  ?? liver or kidney disease;  ?? a stomach disorder; or  ?? a history of drug abuse, alcohol addiction, mental illness, or suicide attempt.  Tramadol is more likely to cause breathing problems in older adults and people who are severely ill, malnourished, or otherwise debilitated.  FDA pregnancy category C. It is not known whether tramadol will harm an unborn baby. Tramadol may cause breathing problems, behavior changes, or life-threatening addiction and withdrawal symptoms in your newborn if you use the  medication during pregnancy. Tell your doctor if you are pregnant.  Tramadol can pass into breast milk and may harm a nursing baby. You should not breast-feed while you are taking tramadol.  Do not give this medication to anyone younger than 41 years old without the advice of a doctor. Ultram ER should not be given to anyone younger than 41 years old.  Rybix ODT may contain phenylalanine. Talk to your doctor before using this form of tramadol if you have phenylketonuria (PKU).  How should I take tramadol?  Follow all directions on your prescription label. Tramadol can slow or stop your breathing, especially when you start using this medicine or whenever your dose is changed. Never take tramadol in larger amounts, or for longer than prescribed. Tell your doctor if the medicine seems to stop working as well in relieving your pain.  Tramadol may be habit-forming, even at regular doses. Never share this medicine with another person, especially someone with a history of drug abuse or addiction. MISUSE OF PAIN MEDICATION CAN CAUSE ADDICTION, OVERDOSE, OR DEATH, especially in a child or other person using the medicine without a prescription. Selling or giving away tramadol is against the law.  Stop taking all other around-the-clock narcotic pain medications when you start taking tramadol.  Tramadol can be taken with or without food, but take it the same way each time.  Do not crush, break, or open an extended-release tablet or capsule (ConZip, Ryzolt, Ultram ER). Swallow  it whole to avoid exposure to a potentially fatal dose.  Never crush or break a tramadol pill to inhale the powder or mix it into a liquid to inject the drug into your vein. This practice has resulted in death with the misuse of tramadol and similar prescription drugs.  To take tramadol orally disintegrating tablets (Rybix ODT):  ?? Keep the tablet in its blister pack until you are ready to take it. Use dry hands to remove the tablet and place it in your  mouth.  ?? Do not swallow the tablet whole. Allow it to dissolve in your mouth without chewing. If desired, you may drink liquid to help swallow the dissolved tablet.  If you use the tramadol extended-release tablet, the tablet shell may pass into your stools (bowel movements). This is normal and does not mean that you are not receiving enough of the medicine.  Do not stop using tramadol suddenly, or you could have unpleasant withdrawal symptoms. Ask your doctor how to safely stop using tramadol.  Store at room temperature away from moisture and heat.  Keep track of the amount of medicine used from each new bottle. Tramadol is a drug of abuse and you should be aware if anyone is using your medicine improperly or without a prescription.  What happens if I miss a dose?  Since tramadol is used for pain, you are not likely to miss a dose. Skip any missed dose if it is almost time for your next scheduled dose. Do not use extra medicine to make up the missed dose.  What happens if I overdose?  Seek emergency medical attention or call the Poison Help line at 1-850-769-3078. A tramadol overdose can be fatal, especially in a child or other person using the medicine without a prescription. Overdose symptoms may include slow breathing and heart rate, severe drowsiness, cold and clammy skin, and fainting.  What should I avoid while taking tramadol?  Do not drink alcohol. Dangerous side effects or death can occur when alcohol is combined with tramadol. Check your food and medicine labels to be sure these products do not contain alcohol.  Tramadol may impair your thinking or reactions. Avoid driving or operating machinery until you know how this medicine will affect you. Dizziness or severe drowsiness can cause falls or other accidents.  What are the possible side effects of tramadol?  Get emergency medical help if you have any of these signs of an allergic reaction: hives; difficulty breathing; swelling of your face, lips,  tongue, or throat.  Call your doctor at once if you have:  ?? seizure (convulsions);  ?? weak or shallow breathing;  ?? high levels of serotonin in the body --agitation, hallucinations, fever, fast heart rate, overactive reflexes, nausea, vomiting, diarrhea, loss of coordination, fainting; or  ?? severe skin reaction --fever, sore throat, swelling in your face or tongue, burning in your eyes, skin pain, followed by a red or purple skin rash that spreads (especially in the face or upper body) and causes blistering and peeling.  Common side effects may include:  ?? headache, dizziness, drowsiness, tired feeling;  ?? constipation, diarrhea, nausea, vomiting, stomach pain; or  ?? feeling nervous or anxious.  ?? itching, sweating, flushing (warmth, redness, or tingly feeling).  This is not a complete list of side effects and others may occur. Call your doctor for medical advice about side effects. You may report side effects to FDA at 1-800-FDA-1088.  What other drugs will affect tramadol?  Taking this medicine  with other drugs that make you sleepy or slow your breathing can cause dangerous or life-threatening side effects. Ask your doctor before taking tramadol with a sleeping pill, narcotic pain medicine, muscle relaxer, or medicine for anxiety, depression, or seizures.  Many drugs can interact with tramadol, including prescription and over-the-counter medicines, vitamins, and herbal products. Not all possible interactions are listed here. Tell each of your health care providers about all medicines you use now and any medicine you start or stop using.  Where can I get more information?  Your pharmacist can provide more information about tramadol.    Remember, keep this and all other medicines out of the reach of children, never share your medicines with others, and use this medication only for the indication prescribed.   Every effort has been made to ensure that the information provided by [place] is  accurate, up-to-date, and complete, but no guarantee is made to that effect. Drug information contained herein may be time sensitive. [employer] information has been compiled for use by healthcare practitioners and consumers in the [place] and therefore [employer] does not warrant that uses outside of the [place] are appropriate, unless specifically indicated otherwise. [employer]'s drug information does not endorse drugs, diagnose patients or recommend therapy. [employer]'s drug information is an Scientist, research (physical sciences) to assist licensed healthcare practitioners in caring for their patients and/or to serve consumers viewing this service as a supplement to, and not a substitute for, the expertise, skill, knowledge and judgment of healthcare practitioners. The absence of a warning for a given drug or drug combination in no way should be construed to indicate that the drug or drug combination is safe, effective or appropriate for any given patient. [employer] does not assume any responsibility for any aspect of healthcare administered with the aid of information [employer] provides. The information contained herein is not intended to cover all possible uses, directions, precautions, warnings, drug interactions, allergic reactions, or adverse effects. If you have questions about the drugs you are taking, check with your doctor, nurse or pharmacist.  Copyright [id number] [employer] [place]: 14.04. Revision date: 12/28/2012.  This information does not replace the advice of a doctor. [employer] disclaims any warranty or liability for your use of this information.   Content Version: 10.5.422740        ondansetron (oral)  Pronunciation: on DAN se tron  Brand: Zofran, Zofran ODT, Zuplenz  What is ondansetron?  Ondansetron blocks the actions of chemicals in the body that can trigger nausea and vomiting.  Ondansetron is used to prevent nausea and vomiting that may be caused by surgery or by medicine to treat  cancer (chemotherapy or radiation).  Ondansetron is not for preventing nausea or vomiting that is caused by factors other than cancer treatment or surgery.  Ondansetron may be used for purposes not listed in this medication guide.  What should I discuss with my health care provider before taking ondansetron?  You should not use ondansetron if you are also using apomorphine (Apokyn).  To make sure ondansetron is safe for you, tell your doctor if you have:  ?? liver disease; or  ?? if you are allergic to medicines similar ondansetron (dolasetron, granisetron, palonosetron).  FDA pregnancy category B. Ondansetron is not expected to harm an unborn baby. Tell your doctor if you are pregnant or plan to become pregnant during treatment.  It is not known whether ondansetron passes into breast milk or if it could harm a nursing baby. Tell  your doctor if you are breast-feeding a baby.  Ondansetron should not be given to a child younger than 5 years old.  Ondansetron orally disintegrating tablets may contain phenylalanine. Tell your doctor if you have phenylketonuria (PKU).  How should I take ondansetron?  Follow all directions on your prescription label. Do not take this medicine in larger or smaller amounts or for longer than recommended.  Ondansetron can be taken with or without food.  The first dose of ondansetron is usually taken before the start of your surgery, chemotherapy, or radiation treatment. Follow your doctor's dosing instructions very carefully.  Take the ondansetron regular tablet  with a full glass of water.  To take the orally disintegrating tablet (Zofran ODT):  ?? Keep the tablet in its blister pack until you are ready to take it. Open the package and peel back the foil. Do not push a tablet through the foil or you may damage the tablet.  ?? Use dry hands to remove the tablet and place it in your mouth.  ?? Do not swallow the tablet whole. Allow it to dissolve in your mouth without chewing.  ?? Swallow several  times as the tablet dissolves.  To use ondansetron oral soluble film (strip) (Zuplenz):  ?? Keep the strip in the foil pouch until you are ready to use the medicine.  ?? Using dry hands, remove the strip and place it on your tongue. It will begin to dissolve right away.  ?? Do not swallow the strip whole. Allow it to dissolve in your mouth without chewing.  ?? Swallow several times after the strip dissolves. If desired, you may drink liquid to help swallow the dissolved strip.  ?? Wash your hands after using Zuplenz.  Measure liquid medicine with the dosing syringe provided, or with a special dose-measuring spoon or medicine cup. If you do not have a dose-measuring device, ask your pharmacist for one.  Store at room temperature away from moisture, heat, and light.  What happens if I miss a dose?  Take the missed dose as soon as you remember. Skip the missed dose if it is almost time for your next scheduled dose. Do not  take extra medicine to make up the missed dose.  What happens if I overdose?  Seek emergency medical attention or call the Poison Help line at 1-203-010-2551.  Overdose symptoms may include sudden loss of vision, severe constipation, feeling light-headed, or fainting.  What should I avoid while taking ondansetron?  Ondansetron may impair your thinking or reactions. Be careful if you drive or do anything that requires you to be alert.  What are the possible side effects of ondansetron?  Get emergency medical help if you have any of these signs of an allergic reaction: rash, hives; fever, chills, difficult breathing; swelling of your face, lips, tongue, or throat.  Call your doctor at once if you have:  ?? fast or pounding heartbeats;  ?? jaundice (yellowing of the skin or eyes);  ?? blurred vision or temporary vision loss (lasting from only a few minutes to several hours); or  ?? high levels of serotonin in the body --agitation, hallucinations, fever, fast heart rate, overactive reflexes, nausea, vomiting,  diarrhea, loss of coordination, fainting.  Common side effects may include:  ?? diarrhea or constipation;  ?? headache;  ?? drowsiness; or  ?? tired feeling.  This is not a complete list of side effects and others may occur. Call your doctor for medical advice about side effects. You may  report side effects to FDA at 1-800-FDA-1088.  What other drugs will affect ondansetron?  There are many other medicines that can increase your risk of heart rhythm problems if you use them together with ondansetron.   Tell your doctor about all medicines you use, and those you start or stop using during your treatment with ondansetron, especially:  ?? anagrelide;  ?? droperidol;  ?? methadone;  ?? an antibiotic --azithromycin, clarithromycin, erythromycin, levofloxacin, moxifloxacin, pentamidine;  ?? cancer medicine --arsenic trioxide, vandetanib;  ?? an antidepressant --citalopram, escitalopram;  ?? anti-malaria medication --chloroquine, halofantrine;  ?? heart rhythm medicine --amiodarone, disopyramide, dofetilide, dronedarone, flecainide, ibutilide, quinidine, sotalol; or  ?? medicine to treat a psychiatric disorder --chlorpromazine, haloperidol, pimozide, thioridazine.  This list is not complete. Other drugs may interact with ondansetron, including prescription and over-the-counter medicines, vitamins, and herbal products. Not all possible interactions are listed in this medication guide.  Where can I get more information?  Your pharmacist can provide more information about ondansetron.    Remember, keep this and all other medicines out of the reach of children, never share your medicines with others, and use this medication only for the indication prescribed.   Every effort has been made to ensure that the information provided by [facility] is accurate, up-to-date, and complete, but no guarantee is made to that effect. Drug information contained herein may be time sensitive. [employer] information has been compiled for use  by healthcare practitioners and consumers in the [place] and therefore [employer] does not warrant that uses outside of the [place] are appropriate, unless specifically indicated otherwise. [employer]'s drug information does not endorse drugs, diagnose patients or recommend therapy. [employer]'s drug information is an Scientist, research (physical sciences) to assist licensed healthcare practitioners in caring for their patients and/or to serve consumers viewing this service as a supplement to, and not a substitute for, the expertise, skill, knowledge and judgment of healthcare practitioners. The absence of a warning for a given drug or drug combination in no way should be construed to indicate that the drug or drug combination is safe, effective or appropriate for any given patient. [employer] does not assume any responsibility for any aspect of healthcare administered with the aid of information [employer] provides. The information contained herein is not intended to cover all possible uses, directions, precautions, warnings, drug interactions, allergic reactions, or adverse effects. If you have questions about the drugs you are taking, check with your doctor, nurse or pharmacist.  Copyright [id number] [employer] [place]: 11.02. Revision date: 10/12/2013.  This information does not replace the advice of a doctor. [employer], [employer] disclaims any warranty or liability for your use of this information.   Content Version: 10.5.422740        canagliflozin  Pronunciation: KAN a gli FLOE zin  Brand: Invokana  What is canagliflozin?  Canagliflozin is an oral diabetes medicine that helps control blood sugar levels. Canagliflozin works by helping the kidneys get rid of glucose from your bloodstream.  Canagliflozin is used together with diet and exercise to treat type 2 diabetes. Canagliflozin is not for treating type 1 diabetes.  Canagliflozin may also be used for purposes not listed in this medication guide.  What should I  discuss with my healthcare provider before taking canagliflozin?  You should not use canagliflozin if you are allergic to it, or if you have:  ?? kidney disease (or if you are on dialysis); or  ?? if you are in a state of diabetic ketoacidosis (call  your doctor for treatment with insulin).  To make sure canagliflozin is safe for you, tell your doctor if you have:  ?? kidney disease;  ?? liver disease;  ?? low blood pressure;  ?? an electrolyte imbalance (such as high levels of potassium in your blood); or  ?? high cholesterol levels.  FDA pregnancy category C. It is not known whether canagliflozin will harm an unborn baby. Tell your doctor if you are pregnant or plan to become pregnant while using this medication.  It is not known whether canagliflozin passes into breast milk or if it could harm a nursing baby. You should not breast-feed while using this medicine.  Side effects may be more likely to occur in older adults.  Do not give this medication to anyone under 40 years old without medical advice.  How should I take canagliflozin?  Canagliflozin is usually taken once per day. Follow all directions on your prescription label. Your doctor may occasionally change your dose to make sure you get the best results. Do not take this medicine in larger or smaller amounts or for longer than recommended.  Canagliflozin works best if you take it before your first meal of the day.  Your blood sugar will need to be checked often, and you may need other blood tests at your doctor's office.  You may have very low blood pressure while taking this medication. Call your doctor if you are sick with vomiting or diarrhea, or if you are sweating more than usual.  Low blood sugar (hypoglycemia) can happen to everyone who has diabetes. Symptoms include headache, hunger, sweating, pale skin, irritability, dizziness, feeling shaky, or trouble concentrating. Always keep a source of sugar with you in case you have low blood sugar. Sugar sources  include fruit juice, hard candy, crackers, raisins, and non-diet soda. Be sure your family and close friends know how to help you in an emergency.  If you have severe hypoglycemia and cannot eat or drink, use a glucagon injection. Your doctor can prescribe a glucagon emergency injection kit and tell you how to use it.  Also watch for signs of high blood sugar (hyperglycemia) such as increased thirst, increased urination, hunger, dry mouth, fruity breath odor, drowsiness, dry skin, blurred vision, and weight loss.  Check your blood sugar carefully during times of stress, travel, illness, surgery or medical emergency, vigorous exercise, or if you drink alcohol or skip meals. These things can affect your glucose levels and your dose needs may also change. Do not change your medication dose or schedule without your doctor's advice.  This medication can cause positive results with certain lab tests for glucose (sugar) in the urine. Tell any doctor who treats you that you are using canagliflozin.  Canagliflozin is only part of a treatment program that may also include diet, exercise, weight control, blood sugar testing, and special medical care. Follow your doctor's instructions very closely.  Store at room temperature away from moisture and heat.  What happens if I miss a dose?  Take the missed dose as soon as you remember. Skip the missed dose if it is almost time for your next scheduled dose. Do not take extra medicine to make up the missed dose.  What happens if I overdose?  Seek emergency medical attention or call the Poison Help line at 1-646-187-9449.  What should I avoid while taking canagliflozin?  Avoid getting up too fast from a sitting or lying position, or you may feel dizzy. Get up slowly and steady  yourself to prevent a fall.  What are the possible side effects of canagliflozin?  Get emergency medical help if you have any of these signs of an allergic reaction: hives; difficult breathing; swelling of your  face, lips, tongue, or throat.  Call your doctor at once if you have:  ?? a light-headed feeling, like you might pass out;  ?? pain or burning when you urinate;  ?? signs of a genital infection (penis or vagina), such as pain, burning, itching, or discharge;  ?? high potassium (slow heart rate, weak pulse, muscle weakness, tingly feeling);  ?? swelling, rapid weight gain, little or no urinating; or  ?? feeling very thirsty or hot, being unable to urinate, heavy sweating, or hot and dry skin.  Common side effects may include:  ?? urinating more than usual;  ?? nausea; or  ?? constipation.  This is not a complete list of side effects and others may occur. Call your doctor for medical advice about side effects. You may report side effects to FDA at 1-800-FDA-1088.  What other drugs will affect canagliflozin?  Tell your doctor about all medicines you use, and those you start or stop using during your treatment with canagliflozin, especially:  ?? digoxin, digitalis;  ?? rifampin;  ?? ritonavir;  ?? a diuretic or "water pill";  ?? heart or blood pressure medicine; or  ?? seizure medication--phenobarbital, phenytoin.  This list is not complete. Other drugs may interact with canagliflozin, including prescription and over-the-counter medicines, vitamins, and herbal products. Not all possible interactions are listed in this medication guide.  Where can I get more information?  Your pharmacist can provide more information about canagliflozin.    Remember, keep this and all other medicines out of the reach of children, never share your medicines with others, and use this medication only for the indication prescribed.   Every effort has been made to ensure that the information provided by [facility] is accurate, up-to-date, and complete, but no guarantee is made to that effect. Drug information contained herein may be time sensitive. [employer] information has been compiled for use by healthcare practitioners and consumers in  the [place] and therefore [employer] does not warrant that uses outside of the [place] are appropriate, unless specifically indicated otherwise. [employer]'s drug information does not endorse drugs, diagnose patients or recommend therapy. [employer]'s drug information is an Scientist, research (physical sciences) to assist licensed healthcare practitioners in caring for their patients and/or to serve consumers viewing this service as a supplement to, and not a substitute for, the expertise, skill, knowledge and judgment of healthcare practitioners. The absence of a warning for a given drug or drug combination in no way should be construed to indicate that the drug or drug combination is safe, effective or appropriate for any given patient. [employer] does not assume any responsibility for any aspect of healthcare administered with the aid of information [employer] provides. The information contained herein is not intended to cover all possible uses, directions, precautions, warnings, drug interactions, allergic reactions, or adverse effects. If you have questions about the drugs you are taking, check with your doctor, nurse or pharmacist.  Copyright [id number] Drew. Version: 1.02. Revision date: 03/10/2013.  This information does not replace the advice of a doctor. [employer] disclaims any warranty or liability for your use of this information.   Content Version: 10.5.422740

## 2014-08-05 NOTE — Progress Notes (Signed)
Subjective:      Patient ID: Jasmine Strickland is a 43 y.o. female.    HPI  Chief Complaint   Patient presents with   ??? Follow Up After Procedure     Pt presents for follow up, she states that she was recently in the hospital and had testing done.   Patient reports that she has been to the ER twice recently. Patient reports that she went once for a migraine, she took her medication at home, but it did not help. Patient has been referred to neurology because this migraine was different than the headaches that she normally has. Reports that she usually starts with excedrine migraine, which was ineffective and then she took imitrex, which was ineffective so she went to ER. When patient went to the ER with chest pain, she was kept overnight. She took NTG at home and that did not relieve chest pain. She reports that she had no abnormal labs or testing. She has since had a stress test which was normal . Patient is not scheduled for f/u with cardiology. Patient denies chest pain since hospital stay.     Review of Systems   Constitutional: Positive for fatigue. Negative for fever, chills, activity change and appetite change.   HENT: Positive for tinnitus (chronic). Negative for ear pain, rhinorrhea, sinus pressure and sore throat.    Respiratory: Negative for cough, shortness of breath and wheezing.    Cardiovascular: Positive for palpitations (occasionally, reports as chronic). Negative for chest pain and leg swelling.   Gastrointestinal: Positive for nausea. Negative for vomiting, abdominal pain, diarrhea, constipation and blood in stool.   Genitourinary: Negative for dysuria, hematuria and vaginal bleeding.   Musculoskeletal:        Fibromyalgia   Skin: Negative.    Neurological: Positive for headaches.   Hematological: Bruises/bleeds easily.   Psychiatric/Behavioral: Negative.        Objective:   Physical Exam   Constitutional: She is oriented to person, place, and time. She appears well-developed and well-nourished.    HENT:   Head: Normocephalic and atraumatic.   Eyes: Conjunctivae and EOM are normal. Pupils are equal, round, and reactive to light.   Neck: Normal range of motion. Neck supple.   Cardiovascular: Normal rate, regular rhythm and normal heart sounds.    Pulmonary/Chest: Effort normal and breath sounds normal.   Abdominal: Soft. Bowel sounds are normal.   Musculoskeletal: Normal range of motion.   Neurological: She is alert and oriented to person, place, and time.   Skin: Skin is warm and dry.   Psychiatric: She has a normal mood and affect.   Vitals reviewed.    BP 116/72 mmHg   Pulse 86   Temp(Src) 97.5 ??F (36.4 ??C) (Tympanic)   Ht 5\' 2"  (1.575 m)   Wt 169 lb (76.658 kg)   BMI 30.90 kg/m2   SpO2 99%    Assessment/Plan:      Jasmine Strickland was seen today for follow up after procedure.    Diagnoses and associated orders for this visit:  Type II or unspecified type diabetes mellitus without mention of complication, not stated as uncontrolled [facility] patient reports she had discussed with Dr. Megan Salon last visit changing from Metformin to Invokana due to GI upset with Metformin. Patient also interested in the side effect of weight loss with Invokana. BUN/Crt 9/1 and GFR 61%. Discussed the possible increase in yeast infections. Medication information provided - see patient instructions.     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 Patient repeated back instructed.   - POCT INR    Fibromyalgia: refill. As below. Controlled Substances Monitoring: Attestation: The Prescription Monitoring Report for this patient was reviewed today. Providence Crosby, CNP)  Documentation: No signs of potential drug abuse or diversion identified. (Bre Pecina D Robynne Roat, CNP)  - traMADol (ULTRAM) 50 MG tablet; TAKE ONE TABLET BY MOUTH EVERY TWELVE (12) HOURS AS NEEDED FOR PAIN    Nausea alone:  - ondansetron (ZOFRAN) 4 MG tablet; TAKE ONE  TABLET BY MOUTH EVERY EIGHT HOURS AS NEEDED FOR NAUSEA

## 2014-09-19 MED ORDER — FOLBIC 2.5-25-2 MG PO TABS
ORAL_TABLET | ORAL | Status: DC
Start: 2014-09-19 — End: 2015-05-16

## 2014-09-19 MED ORDER — PROPRANOLOL HCL 40 MG PO TABS
40 MG | ORAL_TABLET | ORAL | Status: DC
Start: 2014-09-19 — End: 2015-03-29

## 2014-10-21 ENCOUNTER — Ambulatory Visit
Admit: 2014-10-21 | Discharge: 2014-10-21 | Payer: PRIVATE HEALTH INSURANCE | Attending: Family | Primary: Family Medicine

## 2014-10-21 DIAGNOSIS — G43009 Migraine without aura, not intractable, without status migrainosus: Secondary | ICD-10-CM

## 2014-10-21 LAB — POCT PT/INR: INR,(POC): 1.2

## 2014-10-21 MED ORDER — SUMATRIPTAN SUCCINATE 100 MG PO TABS
100 | ORAL_TABLET | ORAL | Status: DC | PRN
Start: 2014-10-21 — End: 2015-07-18

## 2014-10-21 MED ORDER — TOPIRAMATE 25 MG PO TABS
25 MG | ORAL_TABLET | Freq: Every evening | ORAL | Status: DC
Start: 2014-10-21 — End: 2015-09-26

## 2014-10-21 MED ORDER — TRAMADOL HCL 50 MG PO TABS
50 MG | ORAL_TABLET | ORAL | Status: DC
Start: 2014-10-21 — End: 2015-09-26

## 2014-10-21 MED ORDER — METFORMIN HCL 500 MG PO TABS
500 MG | ORAL_TABLET | Freq: Every day | ORAL | Status: DC
Start: 2014-10-21 — End: 2015-12-01

## 2014-10-21 NOTE — Progress Notes (Signed)
Quick Note:    Take 10mg  today. Then 7.5mg  po daily. Rpt in 1 week  ______

## 2014-10-21 NOTE — Progress Notes (Signed)
Subjective:      Patient ID: Jasmine Strickland is a 43 y.o. female.  Medication Refill  Pt presents for refill of tramadol, pt takes for fibromyalgia related pain.   Pt does report that her fibromyalgia is worse.  Headache   This is a chronic problem. The current episode started more than 1 month ago (worse x 6 weeks). Episode frequency: almost daily, 5 out of 7 days per week, reports migraines 2 days/week. The problem has been gradually worsening (reports worsening of fibromylagia over the last 6 weeks as well). The pain is located in the frontal and temporal region. The pain does not radiate. The pain quality is similar to prior headaches. The quality of the pain is described as aching and stabbing. The pain is at a severity of 7/10. The pain is moderate. Associated symptoms include rhinorrhea and tinnitus (for a few months constant, since a bad headache a few months ago). Pertinent negatives include no abdominal pain, coughing, ear pain, fever or sore throat. The symptoms are aggravated by bright light and emotional stress. She has tried triptans, Excedrin, injectable narcotics and oral narcotics (excedrin migraine, imitrex, has been seen inthe ED for migraines in the past) for the symptoms. The treatment provided moderate relief. Her past medical history is significant for migraine headaches and sinus disease. There is no history of hypertension, migraines in the family or recent head traumas. (Fibromyalgia)       Review of Systems   Constitutional: Positive for activity change (over the last 6 weeks with the worsening of her fibromyalgia - recently cut work back to 1 day/ week) and fatigue. Negative for fever, chills and appetite change.   HENT: Positive for rhinorrhea and tinnitus (for a few months constant, since a bad headache a few months ago). Negative for congestion, ear pain, postnasal drip and sore throat.    Eyes: Negative for visual disturbance.   Respiratory: Negative for cough, shortness of breath  and wheezing.    Cardiovascular: Negative for chest pain, palpitations and leg swelling.   Gastrointestinal: Negative for abdominal pain, diarrhea and constipation.   Endocrine: Positive for cold intolerance. Negative for polydipsia, polyphagia and polyuria.   Genitourinary: Negative for dysuria, frequency and menstrual problem.   Musculoskeletal: Positive for myalgias, joint swelling (bilateral knees on occasion - none currently) and arthralgias.   Skin: Negative for rash.   Allergic/Immunologic: Positive for environmental allergies (fall seasonal allergies).   Neurological: Positive for light-headedness (yesterday) and headaches. Negative for syncope.   Psychiatric/Behavioral: Negative for sleep disturbance.       Objective:   Physical Exam   Constitutional: She is oriented to person, place, and time. She appears well-developed and well-nourished.   HENT:   Head: Normocephalic and atraumatic.   Right Ear: Hearing, tympanic membrane, external ear and ear canal normal.   Left Ear: Hearing, tympanic membrane, external ear and ear canal normal.   Mouth/Throat: Uvula is midline, oropharynx is clear and moist and mucous membranes are normal.   Eyes: Conjunctivae and EOM are normal. Pupils are equal, round, and reactive to light.   Neck: Normal range of motion. Neck supple.   Cardiovascular: Normal rate, regular rhythm and normal heart sounds.    Pulmonary/Chest: Effort normal and breath sounds normal.   Abdominal: Soft. Bowel sounds are normal.   Musculoskeletal: Normal range of motion.   Neurological: She is alert and oriented to person, place, and time.   Skin: Skin is warm and dry.   Psychiatric: She has  a normal mood and affect.   Vitals reviewed.    BP 102/68 mmHg   Pulse 64   Temp(Src) 98.1 ??F (36.7 ??C) (Tympanic)   Ht 5\' 2"  (1.575 m)   Wt 173 lb (78.472 kg)   BMI 31.63 kg/m2   SpO2 98%    Assessment/plan:      Dajai was seen today for medication refill and headache.    Diagnoses and associated orders for this  visit:    Migraine without aura and without status migrainosus, not intractable: Instructed to stay well hydrated, try topamax as below and patient to f/u 1 months.   - topiramate (TOPAMAX) 25 MG tablet; Take 1 tablet by mouth nightly  - SUMAtriptan (IMITREX) 100 MG tablet; Take 1 tablet by mouth as needed for Migraine    Protein S deficiency ([place]): instructed to take Coumadin 10 mg today then 7.5 mg daily and repeat INR in one week per Dr. Megan Salon. Patient verbalized understanding.   - POCT INR    Encounter for monitoring coumadin therapy: see note above.   - POCT INR    Fibromyalgia: Discussed patient f/u with Rheumatology as she has not seen any for several years. Refill on tramadol as below.   - Amb External Referral To Rheumatology  - traMADol (ULTRAM) 50 MG tablet; TAKE ONE TABLET BY MOUTH EVERY TWELVE (12) HOURS AS NEEDED FOR PAIN    Type 2 diabetes mellitus without complication ([place]): refill on metformin. Instructed patient to make an appointment to f/u on chronic conditions and to have routine blood work. (A1C, CMP, Fasting lipids, TSH and free T4). Patient verbalized understanding.   - metFORMIN (GLUCOPHAGE) 500 MG tablet; Take 1 tablet by mouth daily (with breakfast)

## 2014-10-21 NOTE — Patient Instructions (Addendum)
1 week repeat INR and 4 weeks f/u in office.     Migraine Headache: Care Instructions  Your Care Instructions  Migraines are painful, throbbing headaches that often start on one side of the head. They may cause nausea and vomiting and make you sensitive to light, sound, or smell.  Without treatment, migraines can last from 4 hours to a few days. Medicines can help prevent migraines or stop them after they have started. Your doctor can help you find which ones work best for you.  Follow-up care is a key part of your treatment and safety. Be sure to make and go to all appointments, and call your doctor if you are having problems. It's also a good idea to know your test results and keep a list of the medicines you take.  How can you care for yourself at home?  ?? Do not drive if you have taken a prescription pain medicine.  ?? Rest in a quiet, dark room until your headache is gone. Close your eyes, and try to relax or go to sleep. Don't watch TV or read.  ?? Put a cold, moist cloth or cold pack on the painful area for 10 to 20 minutes at a time. Put a thin cloth between the cold pack and your skin.  ?? Use a warm, moist towel or a heating pad set on low to relax tight shoulder and neck muscles.  ?? Have someone gently massage your neck and shoulders.  ?? Take your medicines exactly as prescribed. Call your doctor if you think you are having a problem with your medicine. You will get more details on the specific medicines your doctor prescribes.  ?? Be careful not to take pain medicine more often than the instructions allow. You could get worse or more frequent headaches when the medicine wears off.  To prevent migraines  ?? Keep a headache diary so you can figure out what triggers your headaches. Avoiding triggers may help you prevent headaches. Record when each headache began, how long it lasted, and what the pain was like. (Was it throbbing, aching, stabbing, or dull?) Write down any other symptoms you had with the headache,  such as nausea, flashing lights or dark spots, or sensitivity to bright light or loud noise. Note if the headache occurred near your period. List anything that might have triggered the headache. Triggers may include certain foods (chocolate, cheese, wine) or odors, smoke, bright light, stress, or lack of sleep.  ?? If your doctor has prescribed medicine for your migraines, take it as directed. You may have medicine that you take only when you get a migraine and medicine that you take all the time to help prevent migraines.  ?? If your doctor has prescribed medicine for when you get a headache, take it at the first sign of a migraine, unless your doctor has given you other instructions.  ?? If your doctor has prescribed medicine to prevent migraines, take it exactly as prescribed. Call your doctor if you think you are having a problem with your medicine.  ?? Find healthy ways to deal with stress. Migraines are most common during or right after stressful times. Take time to relax before and after you do something that has caused a migraine in the past.  ?? Try to keep your muscles relaxed by keeping good posture. Check your jaw, face, neck, and shoulder muscles for tension. Try to relax them. When you sit at a desk, change positions often. And make sure  to stretch for 30 seconds each hour.  ?? Get plenty of sleep and exercise.  ?? Eat meals on a regular schedule. Avoid foods and drinks that often trigger migraines. These include chocolate, alcohol (especially red wine and port), aspartame, monosodium glutamate (MSG), and some additives found in foods (such as hot dogs, bacon, cold cuts, aged cheeses, and pickled foods).  ?? Limit caffeine. Don't drink too much coffee, tea, or soda. But don't quit caffeine suddenly. That can also give you migraines.  ?? Do not smoke or allow others to smoke around you. If you need help quitting, talk to your doctor about stop-smoking programs and medicines. These can increase your chances of  quitting for good.  ?? If you are taking birth control pills or hormone therapy, talk to your doctor about whether they are triggering your migraines.  When should you call for help?  Call 911 anytime you think you may need emergency care. For example, call if:  ?? You have signs of a stroke. These may include:  ?? Sudden numbness, paralysis, or weakness in your face, arm, or leg, especially on only one side of your body.  ?? Sudden vision changes.  ?? Sudden trouble speaking.  ?? Sudden confusion or trouble understanding simple statements.  ?? Sudden problems with walking or balance.  ?? A sudden, severe headache that is different from past headaches.  Call your doctor now or seek immediate medical care if:  ?? You have new or worse nausea and vomiting.  ?? You have a new or higher fever.  ?? Your headache gets much worse.  Watch closely for changes in your health, and be sure to contact your doctor if:  ?? You are not getting better after 2 days (48 hours).   Where can you learn more?   Go to [email or web address] and sign in to your MyChart account. Enter [phone] in the [place] box to learn more about ???Migraine Headache: Care Instructions.???    If you do not have an account, please click on the ???Sign Up Now??? link.     ?? 2006-2015 Healthwise, Incorporated. Care instructions adapted under license by [facility]. This care instruction is for use with your licensed healthcare professional. If you have questions about a medical condition or this instruction, always ask your healthcare professional. [place] any warranty or liability for your use of this information.  Content Version: 10.6.465758; Current as of: February 04, 2014        topiramate  Pronunciation:  toe PYRE a mate  Brand:  Qudexy XR, Topamax, Topamax Sprinkle, Trokendi XR  What is topiramate?  Topiramate is a seizure medicine, also called an anticonvulsant. Topiramate is used to treat seizures in adults and  children who are at least 31 years old. Trokendi XR is for use in adults and children who are at least 8 years old.  Extended-release topiramate has a higher minimum age (at least 41 years old) when used as the child's only seizure medicine.   The Topamax brand of this medicine is also used to prevent migraine headaches in adults and teenagers who are at least 41 years old. Topamax will only prevent  migraine headaches or reduce the number of attacks. It will not treat a headache that has already begun.  Topiramate may also be used for purposes not listed in this medication guide.  What should I discuss with my healthcare provider before taking topiramate?  You should not use topiramate if you are allergic  to it. Do not take Trokendi XR within 6 hours before or 6 hours after drinking alcohol. You should not use extended-release topiramate if you have metabolic acidosis (high levels of acid in your blood) and are also taking metformin for diabetes.  To make sure topiramate is safe for you, tell your doctor if you have:  ?? glaucoma or other eye problems;  ?? kidney disease, a history of kidney stones, or if you are on dialysis;  ?? severe breathing problems;  ?? a history of mood problems, depression, or suicidal thoughts or actions;  ?? liver disease;  ?? soft or brittle bones (osteoporosis, osteomalacia);  ?? a growth disorder;  ?? if you take lithium; or  ?? if you are sick with diarrhea.  Some people have thoughts about suicide while taking an anticonvulsant. Your doctor will need to check your progress at regular visits while you are using topiramate. Your family or other caregivers should also be alert to changes in your mood or symptoms.  Topiramate can increase the level of acid in your blood (metabolic acidosis). This can weaken your bones, cause kidney stones, or cause growth problems in children or harm to an unborn baby. You may need blood tests to make sure you do not have metabolic acidosis, especially if you are  pregnant.   FDA pregnancy category D. Do not start or stop taking topiramate during pregnancy without your doctor's advice. Taking topiramate during pregnancy may increase the risk of cleft lip and/or cleft palate in the newborn. But having a seizure during pregnancy could harm both mother and baby. There may be other seizure medicine that can be more safely used during pregnancy. Tell your doctor right away if you become pregnant while taking topiramate.  Topiramate can make birth control pills less effective. Use a barrier form of birth control (such as a condom or diaphragm with spermicide) to prevent pregnancy while taking topiramate. Tell your doctor if you become pregnant during treatment.  Topiramate can pass into breast milk and may harm a nursing baby. Tell your doctor if you are breast-feeding a baby.  How should I take topiramate?  Follow all directions on your prescription label. Your doctor may occasionally change your dose to make sure you get the best results. Do not take this medicine in larger or smaller amounts or for longer than recommended.  Topiramate can be taken with or without food.  Do not crush, chew, or break a topiramate tablet. Swallow it whole. The Trokendi XR extended-release capsule must be swallowed whole. Do not break or open.  The Qudexy XR and Topamax Sprinkle Capsule can be swallowed whole. Or you may open the capsule and sprinkle the medicine into a spoonful of applesauce or other soft food. Swallow right away without chewing. Do not save the mixture for later use. Discard the empty capsule.  Carefully follow the swallowing instructions for your medicine.   While using topiramate, you may need frequent blood tests.  Drink plenty of liquids while you are taking topiramate, to prevent kidney stones or an electrolyte imbalance.  Do not stop using topiramate suddenly, even if you feel fine. Stopping suddenly may cause increased seizures. Follow your doctor's instructions about  tapering your dose.  Call your doctor if your seizures get worse or you have them more often while taking topiramate.  Any medical care provider who treats you should know that you take seizure medication.   Store at cool room temperature away from moisture, light, and high heat.  Keep the bottle tightly closed when not in use.  What happens if I miss a dose?  Take the missed dose as soon as you remember. Skip the missed dose if you are more than 6 hours late in taking it. Wait until your next scheduled dose. Do not take extra medicine to make up the missed dose.  Call your doctor for instructions if you miss two or more doses.  What happens if I overdose?  Seek emergency medical attention or call the Poison Help line at [phone].  Overdose symptoms may include drowsiness, agitation, depression, double vision, thinking problems, problems with speech or coordination, fainting, and seizure (convulsions).  What should I avoid while taking topiramate?  Do not drink alcohol. Dangerous side effects or increased seizures can occur when alcohol is combined with topiramate.  Avoid becoming overheated or dehydrated in hot weather. Topiramate can decrease sweating and increase body temperature, leading to life-threatening dehydration (especially in children). This effect can be worse if you also use: cold or allergy medicine, bladder or urinary medicine, a bronchodilator, medicine for Parkinson's disease, or medicine to treat stomach ulcers or irritable bowel syndrome.  Ketogenic or "ketosis" diets that are high in fat and low in carbohydrates can increase the risk of kidney stones. Avoid the use of such diets while you are taking topiramate.  Topiramate may cause blurred vision or impair your thinking or reactions. Avoid driving or operating machinery until you know how this medicine will affect you.  Also avoid activities that could be dangerous if you have an unexpected seizure, such as swimming or climbing in high  places.  What are the possible side effects of topiramate?  Get emergency medical help if you have any of these signs of an allergic reaction: hives; difficulty breathing; swelling of your face, lips, tongue, or throat.  Report any new or worsening mood symptoms to your doctor, such as: mood or behavior changes, anxiety, panic attacks, trouble sleeping, or if you feel impulsive, irritable, agitated, hostile, aggressive, restless, hyperactive (mentally or physically), depressed, or have thoughts about suicide or hurting yourself.  Call your doctor at once if you have:  ?? vision problems, eye pain or redness, sudden vision loss (can be permanent if not treated quickly);  ?? confusion, slowed thinking, memory problems, trouble concentrating, problems with speech;  ?? dehydration symptoms --decreased sweating, high fever, hot and dry skin;  ?? signs of a kidney stone --severe pain in your side or lower back, painful or difficult urination;  ?? signs of too much acid in your blood --irregular heartbeats, feeling tired, loss of appetite, trouble thinking, feeling short of breath; or  ?? signs of too much ammonia in your blood --vomiting, unexplained weakness, feeling like you might pass out.  Common side effects may include:  ?? numbness or tingling in your arms and legs;  ?? flushing (warmth, redness, or tingly feeling);  ?? headache, dizziness, drowsiness, tired feeling;  ?? mood problems, confusion, feeling nervous, problems with thinking or memory;  ?? changes in your sense of taste;  ?? nausea, diarrhea, stomach pain, loss of appetite, indigestion, weight loss; or  ?? rapid back-and-forth movements of the eyes.  This is not a complete list of side effects and others may occur. Call your doctor for medical advice about side effects. You may report side effects to FDA at 1-800-FDA-1088.  What other drugs will affect topiramate?  Taking this medicine with other drugs that make you sleepy can worsen this effect. Ask your doctor  before taking topiramate with a sleeping pill, narcotic pain medicine, muscle relaxer, or medicine for anxiety, depression, or seizures.  Tell your doctor about all medicines you use, and those you start or stop using during your treatment with topiramate, especially:  ?? birth control pills;  ?? metformin;  ?? glaucoma medicine --acetazolamide, methazolamide; or  ?? other seizure medicines --carbamazepine, divalproex sodium, phenytoin, valproic acid, or zonisamide.  This list is not complete. Other drugs may interact with topiramate, including prescription and over-the-counter medicines, vitamins, and herbal products. Not all possible interactions are listed in this medication guide.  Where can I get more information?  Your pharmacist can provide more information about topiramate.    Remember, keep this and all other medicines out of the reach of children, never share your medicines with others, and use this medication only for the indication prescribed.   Every effort has been made to ensure that the information provided by [facility] is accurate, up-to-date, and complete, but no guarantee is made to that effect. Drug information contained herein may be time sensitive. [employer] information has been compiled for use by healthcare practitioners and consumers in the [place] and therefore [employer] does not warrant that uses outside of the [place] are appropriate, unless specifically indicated otherwise. [employer]'s drug information does not endorse drugs, diagnose patients or recommend therapy. [employer]'s drug information is an Scientist, research (physical sciences) to assist licensed healthcare practitioners in caring for their patients and/or to serve consumers viewing this service as a supplement to, and not a substitute for, the expertise, skill, knowledge and judgment of healthcare practitioners. The absence of a warning for a given drug or drug combination in no way should be construed to indicate  that the drug or drug combination is safe, effective or appropriate for any given patient. [employer] does not assume any responsibility for any aspect of healthcare administered with the aid of information [employer] provides. The information contained herein is not intended to cover all possible uses, directions, precautions, warnings, drug interactions, allergic reactions, or adverse effects. If you have questions about the drugs you are taking, check with your doctor, nurse or pharmacist.  Copyright [phone]) [phone] [employer]. Version: 13.03. Revision date: 04/26/2013.  This information does not replace the advice of a doctor. Healthwise, Incorporated disclaims any warranty or liability for your use of this information.   Content Version: 10.6.465758

## 2014-10-28 ENCOUNTER — Encounter: Admit: 2014-10-28 | Discharge: 2014-10-28 | Payer: PRIVATE HEALTH INSURANCE | Primary: Family Medicine

## 2014-10-28 DIAGNOSIS — D6859 Other primary thrombophilia: Secondary | ICD-10-CM

## 2014-10-28 LAB — POCT PT/INR: INR,(POC): 1.9

## 2014-10-28 MED ORDER — METHOCARBAMOL 500 MG PO TABS
500 MG | ORAL_TABLET | Freq: Three times a day (TID) | ORAL | Status: DC | PRN
Start: 2014-10-28 — End: 2015-09-26

## 2014-10-28 NOTE — Telephone Encounter (Signed)
Pt called stating that her Fibromyalgia has been worse lately.  Pt states she has been taking her Tramadol and alternating it with Tylenol and Ibuprofen.  Pt is wanting to know if she could try a muscle relaxer or if that would even help.

## 2014-10-28 NOTE — Telephone Encounter (Signed)
I did send robaxin to pharm for patient to try. I recommend she f/u with Rheumatology as discussed during visit as well.

## 2014-10-28 NOTE — Telephone Encounter (Signed)
LM on pt voicemail

## 2014-10-29 NOTE — Progress Notes (Signed)
Quick Note:    Improved. Same dose, rpt in 2 weeks  ______

## 2014-11-14 MED ORDER — LEVOTHYROXINE SODIUM 50 MCG PO TABS
50 MCG | ORAL_TABLET | ORAL | Status: DC
Start: 2014-11-14 — End: 2015-07-18

## 2014-11-16 ENCOUNTER — Encounter: Attending: Family Medicine | Primary: Family Medicine

## 2014-11-24 MED ORDER — WARFARIN SODIUM 5 MG PO TABS
5 MG | ORAL_TABLET | ORAL | Status: DC
Start: 2014-11-24 — End: 2015-09-06

## 2014-12-06 DIAGNOSIS — R519 Headache, unspecified: Secondary | ICD-10-CM

## 2014-12-07 ENCOUNTER — Inpatient Hospital Stay: Admit: 2014-12-07 | Discharge: 2014-12-07 | Attending: Emergency Medicine

## 2014-12-07 MED ORDER — SUMATRIPTAN SUCCINATE 6 MG/0.5ML SC SOLN
6 MG/0.5ML | SUBCUTANEOUS | Status: DC
Start: 2014-12-07 — End: 2014-12-07

## 2014-12-07 MED ORDER — METOCLOPRAMIDE HCL 10 MG PO TABS
10 MG | Freq: Once | ORAL | Status: AC
Start: 2014-12-07 — End: 2014-12-07
  Administered 2014-12-07: 05:00:00 10 mg via ORAL

## 2014-12-07 MED ORDER — ACETAMINOPHEN 325 MG PO TABS
325 MG | Freq: Once | ORAL | Status: AC
Start: 2014-12-07 — End: 2014-12-07
  Administered 2014-12-07: 05:00:00 650 mg via ORAL

## 2014-12-07 MED ORDER — SUMATRIPTAN SUCCINATE 6 MG/0.5ML SC SOLN
6 MG/0.5ML | Freq: Once | SUBCUTANEOUS | Status: AC
Start: 2014-12-07 — End: 2014-12-07
  Administered 2014-12-07: 05:00:00 6 mg via SUBCUTANEOUS

## 2014-12-07 MED ORDER — DIPHENHYDRAMINE HCL 25 MG PO TABS
25 MG | Freq: Once | ORAL | Status: AC
Start: 2014-12-07 — End: 2014-12-07
  Administered 2014-12-07: 05:00:00 50 mg via ORAL

## 2014-12-07 MED FILL — SUMATRIPTAN SUCCINATE 6 MG/0.5ML SC SOLN: 6 MG/0.5ML | SUBCUTANEOUS | Qty: 0.5

## 2014-12-07 MED FILL — TYLENOL 325 MG PO TABS: 325 MG | ORAL | Qty: 2

## 2014-12-07 MED FILL — DIPHENHYDRAMINE HCL 25 MG PO TABS: 25 MG | ORAL | Qty: 2

## 2014-12-07 MED FILL — METOCLOPRAMIDE HCL 10 MG PO TABS: 10 MG | ORAL | Qty: 1

## 2014-12-07 NOTE — ED Provider Notes (Signed)
Triage Chief Complaint:   Migraine      HOPI:  Jasmine Strickland is a 43 y.o. female that presents headache.  States she woke up with this headache this morning.  It has progressively worsened throughout the day.  She is on coumadin however denies injury, trauma or fall.  Left-sided does radiate to the back of scalp.  Similar to her migraine headaches with photophobia as well as phonophobia.  Denies nausea no vomiting.  c/o numbness on the left side of her face.  No neck pain or stiffness.  No fevers or chills.  No nasal congestion sore throat ear pain.  No other symptoms of chest pain shortness of breath abdominal pain.  Asian states that she was shopping today and so is unable to take her Imitrex. This is not the worst headache of her life.    ROS:  At least 10 point systems reviewed and otherwise acutely negative except as in the [place].    Past Medical History   Diagnosis Date   ??? Asthma    ??? Pleurisy      multiple episodes "several yrs ago"   ??? GERD (gastroesophageal reflux disease)    ??? Protein S deficiency ([place])    ??? Pulmonary embolism ([place])    ??? Migraine    ??? Diabetes mellitus ([place])    ??? Fibromyalgia    ??? Type II or unspecified type diabetes mellitus without mention of complication, not stated as uncontrolled ([place])    ??? Hypothyroidism      Past Surgical History   Procedure Laterality Date   ??? Pelvic laparoscopy     ??? Other surgical history  12/23/2012     laparoscopic cholecystectomy   ??? Cholecystectomy     ??? Endometrial ablation       History reviewed. No pertinent family history.  History     Social History   ??? Marital Status: Married     Spouse Name: N/A     Number of Children: N/A   ??? Years of Education: N/A     Occupational History   ??? Not on file.     Social History Main Topics   ??? Smoking status: Never Smoker    ??? Smokeless tobacco: Never Used   ??? Alcohol Use: No   ??? Drug Use: No   ??? Sexual Activity:     Partners: Male     Other Topics Concern   ??? Not on file     Social History Narrative     Current  Facility-Administered Medications   Medication Dose Route Frequency Provider Last Rate Last Dose   ??? SUMAtriptan (IMITREX) 6 MG/0.5ML injection              Current Outpatient Prescriptions   Medication Sig Dispense Refill   ??? warfarin (COUMADIN) 5 MG tablet Take daily as directed 60 tablet 5   ??? levothyroxine (SYNTHROID) 50 MCG tablet TAKE ONE TABLET BY MOUTH DAILY 30 tablet 5   ??? topiramate (TOPAMAX) 25 MG tablet Take 1 tablet by mouth nightly 60 tablet 0   ??? SUMAtriptan (IMITREX) 100 MG tablet Take 1 tablet by mouth as needed for Migraine 9 tablet 3   ??? traMADol (ULTRAM) 50 MG tablet TAKE ONE TABLET BY MOUTH EVERY TWELVE (12) HOURS AS NEEDED FOR PAIN 60 tablet 1   ??? metFORMIN (GLUCOPHAGE) 500 MG tablet Take 1 tablet by mouth daily (with breakfast) 30 tablet 3   ??? FOLBIC 2.5-25-2 MG TABS TAKE ONE TABLET  BY MOUTH EVERY DAY 30 tablet 5   ??? propranolol (INDERAL) 40 MG tablet TAKE ONE TABLET BY MOUTH DAILY 30 tablet 5   ??? ondansetron (ZOFRAN) 4 MG tablet TAKE ONE TABLET BY MOUTH EVERY EIGHT HOURS AS NEEDED FOR NAUSEA 60 tablet 1   ??? lansoprazole (PREVACID) 30 MG capsule TAKE ONE CAPSULE BY MOUTH DAILY 30 capsule 5   ??? colestipol (COLESTID) 1 G tablet Take 1 tablet by mouth 2 times daily. 60 tablet 3   ??? aspirin EC 81 MG EC tablet Take 81 mg by mouth daily.       ??? warfarin (COUMADIN) 1 MG tablet   Take 5 mg by mouth daily at 1800 Pt takes 7.5 mg every day except Monday and Thursday she takes 5 mg.     ??? methocarbamol (ROBAXIN) 500 MG tablet Take 1 tablet by mouth 3 times daily as needed (muscle spasms) 60 tablet 0     Allergies   Allergen Reactions   ??? Phenergan [Promethazine Hcl] Anaphylaxis   ??? Gluten Meal    ??? Lactose Diarrhea   ??? Orange Syrup    ??? Tomato    ??? Codeine Nausea And Vomiting   ??? Hydrocodone Nausea And Vomiting     Nursing Notes Reviewed    Physical Exam:  ED Triage Vitals   Enc Vitals Group      BP 12/06/14 2349 114/90 mmHg      Pulse 12/06/14 2349 84      Resp 12/06/14 2349 16      Temp 12/06/14 2349  98.4 ??F (36.9 ??C)      Temp Source 12/06/14 2349 Oral      SpO2 12/06/14 2349 98 %      Weight 12/06/14 2349 170 lb (77.111 kg)      Height 12/06/14 2349 5\' 2"  (1.575 m)      Head Cir --       Peak Flow --       Pain Score --       Pain Loc --       Pain Edu? --       Excl. in GC? --        CONSTITUTIONAL:  Well-nourished well-hydrated.adult female. NAD.  Awake alert and oriented ??3.  Cooperative.  Follows commands.  GCS of 15  HEAD: Normocephalic atraumatic head.    EYES: Pupils equal round and reactive to light. EOMi  ENT: Nares clear. MMM, posterior pharynx no erythema swelling lesions uvula was midline no tonsils appreciated.  TMs are clear bilaterally with good cone of light  NECK: Supple.  Full range of motion.  Trachea is midline  CARDIOLOGY: S1 and S2.  Regular rate and rhythm.  No murmurs rubs or gallops.  No JVD.  Adequate peripheral perfusion. +2 radial pulses bilaterally  RESPIRATORY: Nonlabored.  Clear to auscultation bilaterally with no rhonchi wheezes or crackles.  ABDOMEN: Positive bowel sounds.  Soft nondistended nontender.  MUSCULOSKELETAL: No gross deformities.  Moves all four extremities, good strength and tone in all four extremitis.  No midline tenderness  SKIN: No rash.  No petechiae. Warm and dry.  NEUROLOGY: Sensation is intact.  No gross cerebellar deficits.         MDM:  Frontal diagnosis includes migraine headache, meningitis subarachnoid hemorrhage tension headache vascular headache etc.  Headache is similar to her migraine headaches.  Iow suspicion for acute intracranial process  There are no neurological deficits on my exam.  She was given a dose  of Imitrex as well as pain medications.  I did explain to her that narcotics are not appropriate treatment of migraine headaches.  Instructed to follow-up with her primary care doctor in 2-3 days for further evaluation.  She is agreeable to plan of action is verbalized understanding of instructions and has no further questions    Final  Impression:  1. Headache  2. Photophobia  3. Phonophobia  4. Nausea    Dispo: home, condition fair    Discharge referral (if applicable):  No follow-up provider specified.    Discharge medications (if applicable):  Discharge Medication List as of 12/07/2014 12:12 AM            (Please note that portions of this note may have been completed with a voice recognition program. Efforts were made to edit the dictations but occasionally words are mis-transcribed.)    Denna Haggard, MD          Denna Haggard, MD  12/07/14 [id number]

## 2014-12-22 MED ORDER — ONDANSETRON HCL 4 MG PO TABS
4 MG | ORAL_TABLET | ORAL | Status: DC
Start: 2014-12-22 — End: 2015-02-24

## 2014-12-22 MED ORDER — LANSOPRAZOLE 30 MG PO CPDR
30 MG | ORAL_CAPSULE | ORAL | Status: DC
Start: 2014-12-22 — End: 2015-01-24

## 2015-01-09 ENCOUNTER — Encounter: Admit: 2015-01-09 | Discharge: 2015-01-09 | Payer: PRIVATE HEALTH INSURANCE | Primary: Family Medicine

## 2015-01-09 DIAGNOSIS — D6859 Other primary thrombophilia: Secondary | ICD-10-CM

## 2015-01-09 LAB — POCT PT/INR: INR,(POC): 4.4

## 2015-01-09 NOTE — Progress Notes (Signed)
Quick Note:    Hold x 1 day, then go to 5mg  m,f. 7.5mg  other days. Rpt inr in 1 week.  ______

## 2015-01-24 MED ORDER — LANSOPRAZOLE 30 MG PO CPDR
30 MG | ORAL_CAPSULE | ORAL | Status: DC
Start: 2015-01-24 — End: 2016-07-30

## 2015-01-24 MED ORDER — ACYCLOVIR 400 MG PO TABS
400 MG | ORAL_TABLET | ORAL | Status: DC
Start: 2015-01-24 — End: 2015-05-16

## 2015-02-27 MED ORDER — ONDANSETRON HCL 4 MG PO TABS
4 MG | ORAL_TABLET | ORAL | Status: DC
Start: 2015-02-27 — End: 2015-09-20

## 2015-03-29 MED ORDER — PROPRANOLOL HCL 40 MG PO TABS
40 MG | ORAL_TABLET | ORAL | Status: DC
Start: 2015-03-29 — End: 2015-10-26

## 2015-04-14 ENCOUNTER — Encounter

## 2015-04-14 ENCOUNTER — Encounter: Admit: 2015-04-14 | Discharge: 2015-04-14 | Payer: PRIVATE HEALTH INSURANCE | Primary: Family Medicine

## 2015-04-14 DIAGNOSIS — D6859 Other primary thrombophilia: Secondary | ICD-10-CM

## 2015-04-14 LAB — POCT PT/INR: INR,(POC): 2.1

## 2015-05-16 MED ORDER — FOLBIC 2.5-25-2 MG PO TABS
ORAL_TABLET | ORAL | Status: DC
Start: 2015-05-16 — End: 2015-09-26

## 2015-05-16 MED ORDER — ACYCLOVIR 400 MG PO TABS
400 MG | ORAL_TABLET | ORAL | Status: DC
Start: 2015-05-16 — End: 2016-10-14

## 2015-07-18 ENCOUNTER — Encounter

## 2015-07-18 MED ORDER — LEVOTHYROXINE SODIUM 50 MCG PO TABS
50 MCG | ORAL_TABLET | ORAL | 3 refills | Status: DC
Start: 2015-07-18 — End: 2016-03-14

## 2015-07-18 MED ORDER — SUMATRIPTAN SUCCINATE 100 MG PO TABS
100 MG | ORAL_TABLET | ORAL | 0 refills | Status: DC
Start: 2015-07-18 — End: 2015-12-08

## 2015-09-06 ENCOUNTER — Encounter: Admit: 2015-09-06 | Payer: PRIVATE HEALTH INSURANCE | Primary: Family Medicine

## 2015-09-06 DIAGNOSIS — D6859 Other primary thrombophilia: Secondary | ICD-10-CM

## 2015-09-06 LAB — POCT PT/INR: INR,(POC): 1.7

## 2015-09-06 MED ORDER — WARFARIN SODIUM 5 MG PO TABS
5 MG | ORAL_TABLET | ORAL | 5 refills | Status: DC
Start: 2015-09-06 — End: 2016-07-22

## 2015-09-20 MED ORDER — ONDANSETRON HCL 4 MG PO TABS
4 MG | ORAL_TABLET | ORAL | 0 refills | Status: DC
Start: 2015-09-20 — End: 2016-02-14

## 2015-09-26 ENCOUNTER — Ambulatory Visit
Admit: 2015-09-26 | Discharge: 2015-09-26 | Payer: PRIVATE HEALTH INSURANCE | Attending: Family Medicine | Primary: Family Medicine

## 2015-09-26 DIAGNOSIS — E119 Type 2 diabetes mellitus without complications: Secondary | ICD-10-CM

## 2015-09-26 LAB — POCT PT/INR: INR,(POC): 4

## 2015-09-26 MED ORDER — TRAMADOL HCL 50 MG PO TABS
50 MG | ORAL_TABLET | ORAL | 1 refills | Status: DC
Start: 2015-09-26 — End: 2016-09-02

## 2015-09-26 NOTE — Progress Notes (Signed)
Subjective:      Patient ID: Jasmine Strickland is a 44 y.o. female.    Diabetes   She presents for her follow-up diabetic visit. She has type 2 diabetes mellitus. Her disease course has been fluctuating. Pertinent negatives for diabetes include no chest pain. Symptoms are stable. Current diabetic treatments: stopped metformin over past 6 weeks. She is compliant with treatment some of the time. An ACE inhibitor/angiotensin II receptor blocker is not being taken.   diet has been worse over past 6-8 mo.      Review of Systems   Constitutional: Negative for fever.   Respiratory: Negative for shortness of breath.    Cardiovascular: Negative for chest pain.   Gastrointestinal: Negative for blood in stool, constipation and diarrhea.   Genitourinary: Negative for difficulty urinating.       Objective:   Physical Exam   Constitutional: She is oriented to person, place, and time. She appears well-developed and well-nourished.   HENT:   Head: Normocephalic and atraumatic.   Eyes: Conjunctivae and EOM are normal. Pupils are equal, round, and reactive to light.   Neck: Normal range of motion. Neck supple.   Cardiovascular: Normal rate, regular rhythm and normal heart sounds.    Pulmonary/Chest: Effort normal and breath sounds normal.   Abdominal: Soft. There is no tenderness.   Musculoskeletal: She exhibits no edema.   Neurological: She is alert and oriented to person, place, and time.   Skin: Skin is warm and dry.   Psychiatric: She has a normal mood and affect.   Vitals reviewed.  Diabetic Foot Exam    Exam: feet: warm, good capillary refill and normal monofilament exam    Diabetes Mellitus: stable    Visit Vitals   ??? BP 110/78   ??? Pulse 76   ??? Ht 5\' 2"  (1.575 m)   ??? Wt 183 lb 6.4 oz (83.2 kg)   ??? SpO2 98%   ??? BMI 33.54 kg/m2     Assessment:      1. Controlled type 2 diabetes mellitus without complication, without long-term current use of insulin ([place])  Rpt labs today.  Has not been taking meds recently.    - Hemoglobin A1C  -  Lipid Panel  - Comprehensive Metabolic Panel  - Microalbumin / Creatinine Urine Ratio  - HM DIABETES FOOT EXAM    2. Protein S deficiency ([place])  inr above goal.  See note.  Rpt cbc.    - POCT INR  - CBC Auto Differential    3. Other specified hypothyroidism  Rpt labs.  Sig fatigue  - TSH without Reflex  - T4, Free    4. Fibromyalgia  Refill meds.  Has been minimizing use  - traMADol (ULTRAM) 50 MG tablet; TAKE ONE TABLET BY MOUTH EVERY TWELVE (12) HOURS AS NEEDED FOR PAIN  Dispense: 60 tablet; Refill: 1         Plan:      gerd - discussed pros and cons of PPI.  Consider try to go to H2 blocker.

## 2015-09-26 NOTE — Progress Notes (Signed)
Go to 5mg  daily.  Rpt inr in 1-2 weeks

## 2015-09-26 NOTE — Progress Notes (Signed)
BP Readings from Last 2 Encounters:   09/26/15 110/78   12/06/14 114/90     HEMOGLOBIN A1C (%)   Date Value   03/02/2014 5.4     MICROSCOPIC EXAMINATION (no units)   Date Value   04/16/2014 Not Indicated     MICROALBUMIN, RANDOM URINE (mg/dL)   Date Value   03/02/2014 <1.20     LDL CALCULATED (mg/dL)   Date Value   03/02/2014 117 (H)              Tobacco use:  Patient  reports that she has never smoked. She has never used smokeless tobacco.    If Smoker - Cessation materials given? NA    Is Daily aspirin on medication list?:  No    Diabetic retinal exam done? No   If yes, document in Health Maintenance.     Monofilament placed on counter? Yes    Shoes and socks removed? Yes    BP taken with correct size cuff? Yes   Repeated if > 140/90 NA     Is patient taking any medications for diabetes    Yes   If yes, see medication list    Is the patient reporting any side effects of diabetic medications?   No    Microalbumin performed if applicable?  NA      Is patient taking any over the counter medications    yes   If yes, see medication list    Jamil received counseling on the following healthy behaviors: checking sugars    Patient given educational materials on Diabetes    I have instructed Etana to complete a self tracking handout on Blood Sugars  and instructed them to bring it with them to her next appointment.     Discussed use, benefit, and side effects of prescribed medications.  Barriers to medication compliance addressed.  All patient questions answered.  Pt voiced understanding.

## 2015-09-26 NOTE — Progress Notes (Signed)
Left message for pt to c/b 

## 2015-09-27 LAB — COMPREHENSIVE METABOLIC PANEL
ALT: 15 U/L (ref 10–40)
AST: 13 U/L — ABNORMAL LOW (ref 15–37)
Albumin/Globulin Ratio: 1.6 (ref 1.1–2.2)
Albumin: 4.2 g/dL (ref 3.4–5.0)
Alkaline Phosphatase: 90 U/L (ref 40–129)
Anion Gap: 14 (ref 3–16)
BUN: 12 mg/dL (ref 7–20)
CO2: 23 mmol/L (ref 21–32)
Calcium: 9 mg/dL (ref 8.3–10.6)
Chloride: 101 mmol/L (ref 99–110)
Creatinine: 0.7 mg/dL (ref 0.6–1.1)
GFR African American: >60 (ref >60)
GFR Non-African American: >60 (ref >60)
Globulin: 2.6 g/dL
Glucose: 100 mg/dL — ABNORMAL HIGH (ref 70–99)
Potassium: 4.2 mmol/L (ref 3.5–5.1)
Sodium: 138 mmol/L (ref 136–145)
Total Bilirubin: 0.7 mg/dL (ref 0.0–1.0)
Total Protein: 6.8 g/dL (ref 6.4–8.2)

## 2015-09-27 LAB — CBC WITH AUTO DIFFERENTIAL
Basophils %: 0.7 %
Basophils Absolute: 0 10*3/uL (ref 0.0–0.2)
Eosinophils %: 1.4 %
Eosinophils Absolute: 0.1 10*3/uL (ref 0.0–0.6)
Hematocrit: 40 % (ref 36.0–48.0)
Hemoglobin: 13.7 g/dL (ref 12.0–16.0)
Lymphocytes %: 31.1 %
Lymphocytes Absolute: 1.3 10*3/uL (ref 1.0–5.1)
MCH: 31.5 pg (ref 26.0–34.0)
MCHC: 34.3 g/dL (ref 31.0–36.0)
MCV: 91.7 fL (ref 80.0–100.0)
MPV: 10.1 fL (ref 5.0–10.5)
Monocytes %: 8.8 %
Monocytes Absolute: 0.4 10*3/uL (ref 0.0–1.3)
Neutrophils %: 58 %
Neutrophils Absolute: 2.4 10*3/uL (ref 1.7–7.7)
Platelets: 223 10*3/uL (ref 135–450)
RBC: 4.36 M/uL (ref 4.00–5.20)
RDW: 13.4 % (ref 12.4–15.4)
WBC: 4.1 10*3/uL (ref 4.0–11.0)

## 2015-09-27 LAB — MICROALBUMIN / CREATININE URINE RATIO
Creatinine, Ur: 226 mg/dL (ref 28.0–259.0)
Microalbumin, Random Urine: <1.2 mg/dL (ref <2.0)

## 2015-09-27 LAB — HEMOGLOBIN A1C
Hemoglobin A1C: 5.9 %
eAG: 122.6 mg/dL

## 2015-09-27 LAB — LIPID PANEL
Cholesterol, Total: 187 mg/dL (ref 0–199)
HDL: 47 mg/dL (ref 40–60)
LDL Calculated: 123 mg/dL — ABNORMAL HIGH (ref <100)
Triglycerides: 83 mg/dL (ref 0–150)
VLDL Cholesterol Calculated: 17 mg/dL

## 2015-09-27 LAB — TSH: TSH: 1.4 u[IU]/mL (ref 0.27–4.20)

## 2015-09-27 LAB — T4, FREE: T4 Free: 1.5 ng/dL (ref 0.9–1.8)

## 2015-09-27 MED ORDER — FOLBIC 2.5-25-2 MG PO TABS
ORAL_TABLET | ORAL | 5 refills | Status: DC
Start: 2015-09-27 — End: 2016-07-30

## 2015-09-28 MED ORDER — ATORVASTATIN CALCIUM 20 MG PO TABS
20 MG | ORAL_TABLET | Freq: Every day | ORAL | 3 refills | Status: DC
Start: 2015-09-28 — End: 2016-09-02

## 2015-09-28 NOTE — Progress Notes (Signed)
Sugars still well controlled, but not as good as last check.  Urine for microalbumin neg.  Thyroid labs ok.  Lipids above goal.  Is pt willing to take statin?  If so, atorvastatin 20mg  po daily. Cbc and cmp normal

## 2015-10-26 MED ORDER — PROPRANOLOL HCL 40 MG PO TABS
40 MG | ORAL_TABLET | ORAL | 5 refills | Status: DC
Start: 2015-10-26 — End: 2016-06-17

## 2015-11-13 MED ORDER — FREESTYLE LITE TEST VI STRP
5 refills | Status: DC
Start: 2015-11-13 — End: 2016-07-30

## 2015-12-01 ENCOUNTER — Encounter

## 2015-12-04 MED ORDER — METFORMIN HCL 500 MG PO TABS
500 MG | ORAL_TABLET | ORAL | 5 refills | Status: DC
Start: 2015-12-04 — End: 2016-07-30

## 2015-12-08 ENCOUNTER — Encounter

## 2015-12-08 MED ORDER — SUMATRIPTAN SUCCINATE 100 MG PO TABS
100 MG | ORAL_TABLET | ORAL | 3 refills | Status: DC
Start: 2015-12-08 — End: 2016-09-02

## 2015-12-14 ENCOUNTER — Encounter: Admit: 2015-12-14 | Discharge: 2015-12-14 | Payer: PRIVATE HEALTH INSURANCE | Primary: Family Medicine

## 2015-12-14 DIAGNOSIS — D6859 Other primary thrombophilia: Secondary | ICD-10-CM

## 2015-12-14 LAB — POCT PT/INR: INR,(POC): 1.9

## 2016-01-18 MED ORDER — METHOCARBAMOL 500 MG PO TABS
500 MG | ORAL_TABLET | Freq: Three times a day (TID) | ORAL | 0 refills | Status: DC | PRN
Start: 2016-01-18 — End: 2016-04-18

## 2016-01-18 NOTE — Telephone Encounter (Signed)
Pt calling states she is having back pain wanting a refill on her Robaxin.

## 2016-02-14 MED ORDER — ONDANSETRON HCL 4 MG PO TABS
4 MG | ORAL_TABLET | ORAL | 0 refills | Status: DC
Start: 2016-02-14 — End: 2016-05-31

## 2016-02-26 ENCOUNTER — Encounter: Attending: Family | Primary: Family Medicine

## 2016-02-28 ENCOUNTER — Encounter

## 2016-02-28 ENCOUNTER — Encounter: Attending: Family | Primary: Family Medicine

## 2016-02-29 ENCOUNTER — Encounter

## 2016-02-29 ENCOUNTER — Inpatient Hospital Stay: Admit: 2016-02-29 | Attending: Family | Primary: Family Medicine

## 2016-02-29 ENCOUNTER — Inpatient Hospital Stay: Admit: 2016-02-29 | Primary: Family Medicine

## 2016-02-29 DIAGNOSIS — N63 Unspecified lump in unspecified breast: Secondary | ICD-10-CM

## 2016-03-07 ENCOUNTER — Encounter: Primary: Family Medicine

## 2016-03-13 ENCOUNTER — Encounter: Attending: Family | Primary: Family Medicine

## 2016-03-14 MED ORDER — LEVOTHYROXINE SODIUM 50 MCG PO TABS
50 MCG | ORAL_TABLET | ORAL | 3 refills | Status: DC
Start: 2016-03-14 — End: 2016-07-30

## 2016-04-20 MED ORDER — METHOCARBAMOL 500 MG PO TABS
500 MG | ORAL_TABLET | ORAL | 0 refills | Status: DC
Start: 2016-04-20 — End: 2016-07-30

## 2016-04-20 NOTE — Telephone Encounter (Signed)
rec f/u appt

## 2016-05-16 LAB — CBC WITH AUTO DIFFERENTIAL
Basophils %: 0.3 % (ref 0–1)
Eosinophils %: 1 % (ref 0–5)
Erythrocyte Distribution Width RBC Ratio: 13.1 % (ref 11.6–14.8)
Granulocyte Absolute Count: 1.9 10*3/uL
Hematocrit: 39.1 % (ref 35.7–46.7)
Hemoglobin: 12.7 g/dL (ref 11.9–15.9)
Immature Granulocytes %: 0.3 % — ABNORMAL HIGH (ref 0–0)
Lymphocytes %: 23.9 % (ref 15–45)
Lymphocytes Absolute: 0.7 10*3/uL
MCH: 30.6 pg (ref 28.1–33.6)
MCHC: 32.5 g/dL — ABNORMAL LOW (ref 32.8–34.7)
MCV: 94.2 fl (ref 82.9–99.9)
Monocytes %: 12.1 % — ABNORMAL HIGH (ref 0–12)
Neutrophils/100 leukocytes: 62.4 % (ref 40–70)
Platelets: 183 10*3 (ref 175–420)
RBC: 4.15 x1000000 (ref 3.72–5.31)
WBC: 3.1 10*3 — ABNORMAL LOW (ref 4.5–10.3)

## 2016-05-16 LAB — BASIC METABOLIC PANEL
Anion Gap: 9.8 mmol/L (ref 8–16)
BUN: 8 mg/dL (ref 5–19)
CO2: 28 mmol/L (ref 21–33)
Calcium: 8.6 mg/dL (ref 8.4–10.2)
Chloride: 106 mmol/L (ref 99–111)
Creatinine: 0.9 mg/dL (ref 0.6–1.3)
Gfr Calculated: 68
POC Glucose: 90 mg/dL (ref 74–99)
Potassium: 4.1 mmol/L (ref 3.4–5.0)
Sodium: 140 mmol/L (ref 136–146)

## 2016-05-16 LAB — INFLUENZA A AND B ANTIBODIES
Haemophilus influenzae B Ag: POSITIVE
Influenza A Ab: NEGATIVE

## 2016-05-31 MED ORDER — ONDANSETRON HCL 4 MG PO TABS
4 MG | ORAL_TABLET | ORAL | 0 refills | Status: DC
Start: 2016-05-31 — End: 2016-07-30

## 2016-06-17 MED ORDER — PROPRANOLOL HCL 40 MG PO TABS
40 | ORAL_TABLET | Freq: Every day | ORAL | 5 refills | Status: DC
Start: 2016-06-17 — End: 2017-01-13

## 2016-07-22 ENCOUNTER — Encounter: Admit: 2016-07-22 | Discharge: 2016-07-22 | Payer: PRIVATE HEALTH INSURANCE | Primary: Family Medicine

## 2016-07-22 DIAGNOSIS — D6859 Other primary thrombophilia: Secondary | ICD-10-CM

## 2016-07-22 LAB — POCT PT/INR: INR,(POC): 1.4

## 2016-07-22 MED ORDER — WARFARIN SODIUM 5 MG PO TABS
5 | ORAL_TABLET | ORAL | 5 refills | Status: DC
Start: 2016-07-22 — End: 2016-07-30

## 2016-07-30 ENCOUNTER — Ambulatory Visit
Admit: 2016-07-30 | Discharge: 2016-07-30 | Payer: PRIVATE HEALTH INSURANCE | Attending: Family Medicine | Primary: Family Medicine

## 2016-07-30 DIAGNOSIS — E119 Type 2 diabetes mellitus without complications: Secondary | ICD-10-CM

## 2016-07-30 LAB — CBC WITH AUTO DIFFERENTIAL
Basophils %: 1.1 %
Basophils Absolute: 0.1 10*3/uL (ref 0.0–0.2)
Eosinophils %: 2.2 %
Eosinophils Absolute: 0.1 10*3/uL (ref 0.0–0.6)
Hematocrit: 40.7 % (ref 36.0–48.0)
Hemoglobin: 13.7 g/dL (ref 12.0–16.0)
Lymphocytes %: 35.1 %
Lymphocytes Absolute: 1.7 10*3/uL (ref 1.0–5.1)
MCH: 31.6 pg (ref 26.0–34.0)
MCHC: 33.6 g/dL (ref 31.0–36.0)
MCV: 94.1 fL (ref 80.0–100.0)
MPV: 10.7 fL — ABNORMAL HIGH (ref 5.0–10.5)
Monocytes %: 7 %
Monocytes Absolute: 0.3 10*3/uL (ref 0.0–1.3)
Neutrophils %: 54.6 %
Neutrophils Absolute: 2.7 10*3/uL (ref 1.7–7.7)
Platelets: 237 10*3/uL (ref 135–450)
RBC: 4.32 M/uL (ref 4.00–5.20)
RDW: 13.3 % (ref 12.4–15.4)
WBC: 4.9 10*3/uL (ref 4.0–11.0)

## 2016-07-30 LAB — COMPREHENSIVE METABOLIC PANEL
ALT: 12 U/L (ref 10–40)
AST: 14 U/L — ABNORMAL LOW (ref 15–37)
Albumin/Globulin Ratio: 1.6 (ref 1.1–2.2)
Albumin: 3.9 g/dL (ref 3.4–5.0)
Alkaline Phosphatase: 90 U/L (ref 40–129)
Anion Gap: 16 (ref 3–16)
BUN: 11 mg/dL (ref 7–20)
CO2: 22 mmol/L (ref 21–32)
Calcium: 9 mg/dL (ref 8.3–10.6)
Chloride: 105 mmol/L (ref 99–110)
Creatinine: 0.7 mg/dL (ref 0.6–1.1)
GFR African American: >60 (ref >60)
GFR Non-African American: >60 (ref >60)
Globulin: 2.5 g/dL
Glucose: 125 mg/dL — ABNORMAL HIGH (ref 70–99)
Potassium: 4.5 mmol/L (ref 3.5–5.1)
Sodium: 143 mmol/L (ref 136–145)
Total Bilirubin: 0.4 mg/dL (ref 0.0–1.0)
Total Protein: 6.4 g/dL (ref 6.4–8.2)

## 2016-07-30 LAB — FOLLICLE STIMULATING HORMONE: FSH: 7.4 m[IU]/mL

## 2016-07-30 LAB — LIPID PANEL
Cholesterol, Total: 183 mg/dL (ref 0–199)
HDL: 47 mg/dL (ref 40–60)
LDL Calculated: 110 mg/dL — ABNORMAL HIGH (ref <100)
Triglycerides: 132 mg/dL (ref 0–150)
VLDL Cholesterol Calculated: 26 mg/dL

## 2016-07-30 LAB — VITAMIN B12: Vitamin B-12: 337 pg/mL (ref 211–911)

## 2016-07-30 LAB — T3, FREE: T3, Free: 2.6 pg/mL (ref 2.3–4.2)

## 2016-07-30 LAB — LUTEINIZING HORMONE: LH: 21 m[IU]/mL

## 2016-07-30 MED ORDER — GLUCOSE BLOOD VI STRP
5 refills | Status: DC
Start: 2016-07-30 — End: 2018-03-12

## 2016-07-30 MED ORDER — FA-PYRIDOXINE-CYANOCOBALAMIN 2.5-25-2 MG PO TABS
2.5-25-2 | ORAL_TABLET | ORAL | 5 refills | Status: DC
Start: 2016-07-30 — End: 2017-12-04

## 2016-07-30 MED ORDER — METHOCARBAMOL 500 MG PO TABS
500 | ORAL_TABLET | ORAL | 5 refills | Status: DC
Start: 2016-07-30 — End: 2017-08-09

## 2016-07-30 MED ORDER — PHENTERMINE HCL 37.5 MG PO TABS
37.5 | ORAL_TABLET | Freq: Every day | ORAL | 0 refills | Status: DC
Start: 2016-07-30 — End: 2016-09-02

## 2016-07-30 MED ORDER — ONDANSETRON HCL 4 MG PO TABS
4 | ORAL_TABLET | ORAL | 0 refills | Status: DC
Start: 2016-07-30 — End: 2016-11-18

## 2016-07-30 MED ORDER — METFORMIN HCL 500 MG PO TABS
500 | ORAL_TABLET | ORAL | 5 refills | Status: DC
Start: 2016-07-30 — End: 2017-12-04

## 2016-07-30 MED ORDER — WARFARIN SODIUM 5 MG PO TABS
5 | ORAL_TABLET | ORAL | 5 refills | Status: DC
Start: 2016-07-30 — End: 2019-09-01

## 2016-07-30 MED ORDER — LEVOTHYROXINE SODIUM 50 MCG PO TABS
50 | ORAL_TABLET | ORAL | 5 refills | Status: DC
Start: 2016-07-30 — End: 2017-01-30

## 2016-07-30 NOTE — Progress Notes (Signed)
Subjective:      Patient ID: Jasmine Strickland is a 45 y.o. female.  Chief Complaint   Patient presents with   ??? Diabetes     dm check        Diabetes   She presents for her follow-up diabetic visit. She has type 2 diabetes mellitus. Her disease course has been stable. Hypoglycemia symptoms include dizziness and sweats. Associated symptoms include fatigue. Pertinent negatives for diabetes include no chest pain. Symptoms are stable. Risk factors for coronary artery disease include diabetes mellitus and obesity. Current diabetic treatment includes oral agent (monotherapy). An ACE inhibitor/angiotensin II receptor blocker is not being taken.   quit taking tramadol.  Did not seem to work well for her.  Doing fairly well w/ muscle relaxer.  Fibromyalgia sxs worse in the winter.    Has had some issues w/ keeping weight down.    Review of Systems   Constitutional: Positive for fatigue. Negative for fever.   Respiratory: Negative for shortness of breath.    Cardiovascular: Negative for chest pain and palpitations.   Musculoskeletal: Positive for myalgias.   Neurological: Positive for dizziness.       Objective:   Physical Exam   Constitutional: She is oriented to person, place, and time. She appears well-developed and well-nourished.   HENT:   Head: Normocephalic and atraumatic.   Eyes: Conjunctivae and EOM are normal. Pupils are equal, round, and reactive to light.   Neck: Normal range of motion. Neck supple.   Cardiovascular: Normal rate, regular rhythm and normal heart sounds.    Pulmonary/Chest: Effort normal and breath sounds normal.   Abdominal: Soft. There is no tenderness.   Musculoskeletal: She exhibits no edema.   Neurological: She is alert and oriented to person, place, and time.   Skin: Skin is warm and dry.   Psychiatric: She has a normal mood and affect.   Vitals reviewed.    BP 112/78   Pulse 87   Ht 5' 1.81" (1.57 m)   Wt 192 lb (87.1 kg)   LMP 07/08/2016   SpO2 98%   BMI 35.33 kg/m2   Assessment/Plan   1.  Type 2 diabetes mellitus without complication, without long-term current use of insulin ([place])  Repeat labs.  Refill meds  - metFORMIN (GLUCOPHAGE) 500 MG tablet; TAKE ONE TABLET BY MOUTH DAILY (WITH BREAKFAST)  Dispense: 30 tablet; Refill: 5  - glucose blood VI test strips (FREESTYLE LITE) strip; USE TO TEST TWO TIMES A DAY  Dispense: 100 each; Refill: 5    2. Hypothyroidism, unspecified type  Still sig fatigue.  Add t3 to labs.    - levothyroxine (SYNTHROID) 50 MCG tablet; TAKE ONE TABLET BY MOUTH DAILY  Dispense: 30 tablet; Refill: 5    3. Fibromyalgia  Refill. Not using ultram.  Discussed exercise and stretching as well  - methocarbamol (ROBAXIN) 500 MG tablet; TAKE ONE TABLET THREE TIMES A DAY  Dispense: 60 tablet; Refill: 3    4. Migraine without status migrainosus, not intractable, unspecified migraine type  Fair control    5. Protein S deficiency ([place])  refill  - warfarin (COUMADIN) 5 MG tablet; Take daily as directed  Dispense: 60 tablet; Refill: 5    6. Fatigue, unspecified type  See above.  Check additional hormonal labs as well    7. Non morbid obesity, unspecified obesity type  Pt had been on adipex in the past, approx 2 yrs ago.  Active.  Working on diet.  Diff losing  weight.  Understands pros and cons.  Will try adipex today. F/u in 1 mo for weight and med check.

## 2016-07-31 LAB — HEMOGLOBIN A1C
Hemoglobin A1C: 5.7 %
eAG: 116.9 mg/dL

## 2016-07-31 NOTE — Progress Notes (Signed)
Sugar control still excellent.  t3 normal.  b12 low.  May be contrib to fatigue.  rec supplementation.  Lipids well controlled.  cmp ok.  fsh ok.  Cbc ok.  Estrogen pending

## 2016-08-01 MED ORDER — SYRINGE/NEEDLE (DISP) 25G X 5/8" 1 ML MISC
11 refills | Status: DC
Start: 2016-08-01 — End: 2018-03-12

## 2016-08-01 MED ORDER — CYANOCOBALAMIN 1000 MCG/ML IJ SOLN
1000 MCG/ML | INTRAMUSCULAR | 11 refills | Status: DC
Start: 2016-08-01 — End: 2017-08-25

## 2016-08-01 NOTE — Progress Notes (Signed)
Ok to send order for injections

## 2016-08-02 LAB — ESTROGENS, FRACTIONATED
Estradiol: 48.6 pg/mL
Estrogen Total: 97 pg/mL
Estrone: 48.4 pg/mL

## 2016-08-05 NOTE — Progress Notes (Signed)
Estrogen level not in postmenopausal range

## 2016-09-02 ENCOUNTER — Ambulatory Visit
Admit: 2016-09-02 | Discharge: 2016-09-02 | Payer: PRIVATE HEALTH INSURANCE | Attending: Family Medicine | Primary: Family Medicine

## 2016-09-02 DIAGNOSIS — E669 Obesity, unspecified: Secondary | ICD-10-CM

## 2016-09-02 LAB — POCT PT/INR: INR,(POC): 1.4

## 2016-09-02 MED ORDER — PHENTERMINE HCL 37.5 MG PO TABS
37.5 | ORAL_TABLET | Freq: Every day | ORAL | 0 refills | Status: DC
Start: 2016-09-02 — End: 2016-10-01

## 2016-09-02 NOTE — Patient Instructions (Signed)
[email or web address]    [email or web address]      [email or web address]

## 2016-09-02 NOTE — Progress Notes (Signed)
Addressed at office visit.  7.5mg  m,f.  5mg  all others.  Rpt in 2 weeks

## 2016-09-02 NOTE — Progress Notes (Signed)
Go to 7.5mg  m and f.  5mg  other days.  Rpt in 2 weeks.

## 2016-09-02 NOTE — Progress Notes (Signed)
Subjective:      Patient ID: Jasmine Strickland is a 45 y.o. female.  Chief Complaint   Patient presents with   ??? Weight Management     Pt is here for follow up on weight management.   overall doing well w/ medication.  Has lost weight.  tol well.  Has been trying to focus on eating healthy and foods w/ better nutrition.  Has not eliminated carbs.  Has a treadmill and plans on using more this winter.      Ongoing issues w/ HA and dizziness.  Not new.  Did not seem to be related to adipex.    HPI    Review of Systems   Constitutional: Negative for fever.   Cardiovascular: Negative for palpitations.   Neurological: Positive for headaches. Negative for dizziness and light-headedness.       Objective:   Physical Exam   Constitutional: She is oriented to person, place, and time. She appears well-developed and well-nourished.   HENT:   Head: Normocephalic and atraumatic.   Eyes: Conjunctivae and EOM are normal.   Neck: Normal range of motion. Neck supple.   Cardiovascular: Normal rate, regular rhythm and normal heart sounds.    Pulmonary/Chest: Effort normal and breath sounds normal.   Abdominal: Soft. There is no tenderness.   Musculoskeletal: She exhibits no edema.   Neurological: She is alert and oriented to person, place, and time.   Skin: Skin is warm and dry.   Psychiatric: She has a normal mood and affect.   Vitals reviewed.    BP 120/80   Pulse 90   Ht 5' 1.81" (1.57 m)   Wt 183 lb 6.4 oz (83.2 kg)   SpO2 98%   BMI 33.75 kg/m2   Assessment/Plan   1. Non morbid obesity, unspecified obesity type  Has lost weight.  Looks like had been on topamax in the past.  Given websites that may be helpful.    - phentermine (ADIPEX-P) 37.5 MG tablet; Take 1 tablet by mouth every morning (before breakfast) BMI: 35.33  Dispense: 30 tablet; Refill: 0    2. Protein S deficiency ([place])  inr below goal.  Adjusted dose  - POCT INR    3. Migraine without status migrainosus, not intractable, unspecified migraine type  Ongoing issues.   Consider other options moving forward        Discussed check b2, b6 levels, vit d w/ next draw

## 2016-09-30 ENCOUNTER — Encounter: Attending: Family Medicine | Primary: Family Medicine

## 2016-10-01 ENCOUNTER — Ambulatory Visit
Admit: 2016-10-01 | Discharge: 2016-10-01 | Payer: PRIVATE HEALTH INSURANCE | Attending: Family Medicine | Primary: Family Medicine

## 2016-10-01 DIAGNOSIS — Z6832 Body mass index (BMI) 32.0-32.9, adult: Secondary | ICD-10-CM

## 2016-10-01 LAB — POCT PT/INR: INR,(POC): 2.3

## 2016-10-01 MED ORDER — PHENTERMINE HCL 37.5 MG PO TABS
37.5 | ORAL_TABLET | Freq: Every day | ORAL | 0 refills | Status: AC
Start: 2016-10-01 — End: 2016-10-31

## 2016-10-01 NOTE — Progress Notes (Signed)
INR at goal.  Continue current coumadin dose and repeat INR in 4 weeks

## 2016-10-01 NOTE — Progress Notes (Signed)
Subjective:      Patient ID: Jasmine Strickland is a 45 y.o. female.  Chief Complaint   Patient presents with   ??? Weight Management   tol adipex well.  No new issues.  Has began eating yogurt to inc calcium.  Does not get much dairy.  Able to tolerate.  Does not eat a lot of fruit.  Does eat veggies.  Working on [employer].  Diff w/ nuts due to daughter w/ severe nut allergy.    Some energy improvement w/ b12.    HPI    Review of Systems   Constitutional: Negative for fever.   Respiratory: Negative for shortness of breath.    Gastrointestinal: Negative for constipation and diarrhea.       Objective:   Physical Exam   Constitutional: She is oriented to person, place, and time. She appears well-developed and well-nourished.   HENT:   Head: Normocephalic and atraumatic.   Eyes: Conjunctivae and EOM are normal.   Neck: No tracheal deviation present.   Cardiovascular: Normal rate, regular rhythm and normal heart sounds.    Pulmonary/Chest: Effort normal and breath sounds normal.   Abdominal: Soft. There is no tenderness.   Musculoskeletal: She exhibits no edema.   Neurological: She is alert and oriented to person, place, and time.   Skin: Skin is warm and dry.   Psychiatric: She has a normal mood and affect.   Vitals reviewed.    BP 118/78    Pulse 78    Ht 5' 1.8" (1.57 m)    Wt 177 lb 6.4 oz (80.5 kg)    SpO2 98%    BMI 32.66 kg/m??    Assessment/Plan   1. Class 1 obesity without serious comorbidity with body mass index (BMI) of 32.0 to 32.9 in adult, unspecified obesity type  Doing well.  Discussed protein options.  Cont meds.  Consider med like contrave in the future    2. Controlled type 2 diabetes mellitus without complication, without long-term current use of insulin ([place])  Rpt w/ next labs  - Microalbumin / Creatinine Urine Ratio; Future    3. Protein S deficiency ([place])  INR at goal.  Continue current coumadin dose and repeat INR in 4 weeks   - POCT INR    4. Hypothyroidism, unspecified type  t3 was borderline  last check.  Still sig fatigue. Rpt labs in 1 mo.  Consider change med to nature throid vs add cytomel depending on results  - TSH without Reflex; Future  - T4, Free; Future  - T3, FREE; Future    5. B12 deficiency  Rpt labs in 1 mo.  Cont shots  - Vitamin B12; Future

## 2016-10-14 MED ORDER — ACYCLOVIR 400 MG PO TABS
400 MG | ORAL_TABLET | ORAL | 0 refills | Status: DC
Start: 2016-10-14 — End: 2016-11-18

## 2016-10-14 NOTE — Telephone Encounter (Signed)
This dosing does not appear correct.  5x per day x 5d?

## 2016-10-14 NOTE — Telephone Encounter (Signed)
Called the patient and she said that she normally doesn't take it that way.  She usually takes 3 at a time and the fever blisters are cleared up and she said if it's written for 5 at a time, it will last her through a few episodes.

## 2016-11-06 ENCOUNTER — Ambulatory Visit
Admit: 2016-11-06 | Discharge: 2016-11-06 | Payer: PRIVATE HEALTH INSURANCE | Attending: Family Medicine | Primary: Family Medicine

## 2016-11-06 DIAGNOSIS — E039 Hypothyroidism, unspecified: Secondary | ICD-10-CM

## 2016-11-06 LAB — POCT PT/INR: INR,(POC): 1.9

## 2016-11-06 NOTE — Patient Instructions (Signed)
Contrave, belviq, saxenda, qsymia

## 2016-11-06 NOTE — Progress Notes (Signed)
Subjective:      Patient ID: Jasmine Strickland is a 45 y.o. female.  Chief Complaint   Patient presents with   ??? Weight Management   has cont to work on weight loss.  Went for a period w/ not eating enough.  Adjusted meals.  Has been exercising.  Inc intake and has lost weight.  Has inc freq of meals.  Has finished adipex.    Due for some rpt labs.    b12 has helped somewhat.    HPI    Review of Systems   Constitutional: Positive for fatigue. Negative for appetite change and fever.   Respiratory: Negative for shortness of breath.    Gastrointestinal: Negative for constipation and diarrhea.       Objective:   Physical Exam   Constitutional: She is oriented to person, place, and time. She appears well-developed and well-nourished.   HENT:   Head: Normocephalic and atraumatic.   Eyes: Conjunctivae and EOM are normal.   Neck: No tracheal deviation present.   Cardiovascular: Normal rate, regular rhythm and normal heart sounds.    Pulmonary/Chest: Effort normal and breath sounds normal.   Abdominal: Soft. There is no tenderness.   Musculoskeletal: She exhibits no edema.   Neurological: She is alert and oriented to person, place, and time.   Skin: Skin is warm and dry.   Psychiatric: She has a normal mood and affect.   Vitals reviewed.    BP 110/74    Pulse 82    Ht 5' 1.8" (1.57 m)    Wt 172 lb 3.2 oz (78.1 kg)    SpO2 98%    BMI 31.70 kg/m??    Assessment/Plan   1. Hypothyroidism, unspecified type  Rpt labs today. Consider nature-throid vs others.    - T3, FREE  - T4, Free  - TSH without Reflex    2. Class 1 obesity without serious comorbidity with body mass index (BMI) of 31.0 to 31.9 in adult, unspecified obesity type  Given names of other meds.      3. Controlled type 2 diabetes mellitus without complication, without long-term current use of insulin ([facility])  Rpt ha1c in 3 mo or so  - Microalbumin / Creatinine Urine Ratio    4. B12 deficiency  Rpt labs  - Vitamin B12    5. Protein S deficiency ([place])  Rpt inr today  reviewed.  Rpt inr in 3 weeks  - POCT INR

## 2016-11-07 LAB — TSH: TSH: 1.96 u[IU]/mL (ref 0.27–4.20)

## 2016-11-07 LAB — T4, FREE: T4 Free: 1.3 ng/dL (ref 0.9–1.8)

## 2016-11-07 LAB — T3, FREE: T3, Free: 2.5 pg/mL (ref 2.3–4.2)

## 2016-11-07 LAB — MICROALBUMIN / CREATININE URINE RATIO
Creatinine, Ur: 431.5 mg/dL — ABNORMAL HIGH (ref 28.0–259.0)
Microalbumin Creatinine Ratio: 4.4 mg/g (ref 0.0–30.0)
Microalbumin, Random Urine: 1.9 mg/dL (ref <2.0)

## 2016-11-07 LAB — VITAMIN B12: Vitamin B-12: 602 pg/mL (ref 211–911)

## 2016-11-11 ENCOUNTER — Encounter

## 2016-11-11 MED ORDER — THYROID 32.5 MG PO TABS
32.5 MG | ORAL_TABLET | Freq: Every day | ORAL | 3 refills | Status: DC
Start: 2016-11-11 — End: 2017-03-31

## 2016-11-18 MED ORDER — ACYCLOVIR 400 MG PO TABS
400 MG | ORAL_TABLET | ORAL | 0 refills | Status: DC
Start: 2016-11-18 — End: 2017-02-26

## 2016-11-18 MED ORDER — ONDANSETRON HCL 4 MG PO TABS
4 MG | ORAL_TABLET | ORAL | 0 refills | Status: DC
Start: 2016-11-18 — End: 2017-02-26

## 2017-01-13 MED ORDER — PROPRANOLOL HCL 40 MG PO TABS
40 MG | ORAL_TABLET | ORAL | 5 refills | Status: DC
Start: 2017-01-13 — End: 2017-01-30

## 2017-01-23 NOTE — Telephone Encounter (Signed)
Attempted to contact patient on 01/23/2017.     Result: left message on the patient's voicemail asking patient to return my call.    Pre-Visit planning not completed.       Adele Schilder  Patient Services Specialist  [phone]  [phone]

## 2017-01-24 NOTE — Telephone Encounter (Signed)
Attempted to contact patient on 01/24/2017.     Result: left message on the patient's voicemail asking patient to return my call. Final attempt to reach patient.    Pre-Visit planning not completed.       Adele Schilder  Patient Services Specialist  [phone]  [phone]

## 2017-01-30 ENCOUNTER — Encounter: Attending: Family Medicine | Primary: Family Medicine

## 2017-01-30 ENCOUNTER — Ambulatory Visit
Admit: 2017-01-30 | Discharge: 2017-01-30 | Payer: BLUE CROSS/BLUE SHIELD | Attending: Family Medicine | Primary: Family Medicine

## 2017-01-30 DIAGNOSIS — E119 Type 2 diabetes mellitus without complications: Secondary | ICD-10-CM

## 2017-01-30 LAB — POCT PT/INR: INR,(POC): 1.3

## 2017-01-30 MED ORDER — PROPRANOLOL HCL ER 60 MG PO CP24
60 MG | ORAL_CAPSULE | Freq: Every day | ORAL | 5 refills | Status: DC
Start: 2017-01-30 — End: 2017-09-03

## 2017-01-30 NOTE — Progress Notes (Signed)
Chief Complaint   Patient presents with   ??? Diabetes   ??? Hypothyroidism       HPI:  Jasmine Strickland is a 46 y.o. (DOB: 1971-04-05) here today   for   Diabetes   She presents for her follow-up diabetic visit. She has type 2 diabetes mellitus. Pertinent negatives for hypoglycemia include no confusion or dizziness. Associated symptoms include chest pain and fatigue. Pertinent negatives for diabetes include no blurred vision. Risk factors for coronary artery disease include diabetes mellitus. Current diabetic treatment includes oral agent (monotherapy). She is compliant with treatment most of the time. She has not had a previous visit with a dietitian. An ACE inhibitor/angiotensin II receptor blocker is not being taken. She does not see a podiatrist.Eye exam is not current.   Pt is taking the Metformin about every other day.  Sugars had been well controlled.    Pt does have fluttering and sharp chest pains at times but same symptoms that she has had for over 2 years.  Pt has seen Cardio in the past.  Symptoms unchanged.  meds seem to help w/ palpitations and migraines.    Fatigue improved w/ change in thyroid med.    Patient's medications, allergies, past medical, surgical, social and family histories were reviewed and updated as appropriate.    ROS:  Review of Systems   Constitutional: Positive for fatigue. Negative for activity change and appetite change.   HENT: Negative for congestion, ear discharge, ear pain and sneezing.    Eyes: Negative for blurred vision.   Respiratory: Negative for cough and chest tightness.    Cardiovascular: Positive for chest pain.   Genitourinary: Negative for flank pain.   Skin: Negative for color change.   Neurological: Negative for dizziness.   Psychiatric/Behavioral: Negative for confusion.         Hemoglobin A1C (%)   Date Value   07/30/2016 5.7     Microscopic Examination (no units)   Date Value   04/16/2014 Not Indicated     Microalbumin, Random Urine (mg/dL)   Date Value    11/06/2016 1.90     LDL Calculated (mg/dL)   Date Value   07/30/2016 110 (H)     Past Medical History:   Diagnosis Date   ??? Asthma    ??? Diabetes mellitus ([place])    ??? Fibromyalgia    ??? GERD (gastroesophageal reflux disease)    ??? Hypothyroidism    ??? Migraine    ??? Pleurisy     multiple episodes "several yrs ago"   ??? Protein S deficiency ([place])    ??? Pulmonary embolism ([place])    ??? Type II or unspecified type diabetes mellitus without mention of complication, not stated as uncontrolled      No family history on file.  Social History     Social History   ??? Marital status: Married     Spouse name: N/A   ??? Number of children: N/A   ??? Years of education: N/A     Occupational History   ??? Not on file.     Social History Main Topics   ??? Smoking status: Never Smoker   ??? Smokeless tobacco: Never Used   ??? Alcohol use No   ??? Drug use: No   ??? Sexual activity: Yes     Partners: Male     Other Topics Concern   ??? Not on file     Social History Narrative   ??? No narrative on file  Prior to Visit Medications    Medication Sig Taking? Authorizing Provider   propranolol (INDERAL) 40 MG tablet TAKE ONE TABLET BY MOUTH EVERY DAY Yes Margaretmary Dys, MD   ondansetron (ZOFRAN) 4 MG tablet TAKE ONE TABLET BY MOUTH EVERY EIGHT HOURS AS NEEDED FOR NAUSEA Yes Margaretmary Dys, MD   acyclovir (ZOVIRAX) 400 MG tablet TAKE 5 TABLETS BY MOUTH ONCE A DAY FOR 5 DAYS Yes Margaretmary Dys, MD   thyroid (NATURE-THROID) 32.5 MG tablet Take 1 tablet by mouth daily Yes Margaretmary Dys, MD   cyanocobalamin 1000 MCG/ML injection Inject 1 mL into the muscle every 30 days Yes Margaretmary Dys, MD   SYRINGE/NEEDLE, DISP, 1 ML (B-D SYRINGE/NEEDLE 1CC/25GX5/8) 25G X 5/8" 1 ML MISC Inject 1 Syringe into the skin every 30 days Yes Margaretmary Dys, MD   warfarin (COUMADIN) 5 MG tablet Take daily as directed Yes Margaretmary Dys, MD   methocarbamol (ROBAXIN) 500 MG tablet TAKE ONE TABLET THREE TIMES A DAY Yes Margaretmary Dys, MD   metFORMIN (GLUCOPHAGE) 500 MG tablet TAKE ONE  TABLET BY MOUTH DAILY (WITH BREAKFAST) Yes Margaretmary Dys, MD   glucose blood VI test strips (FREESTYLE LITE) strip USE TO TEST TWO TIMES A DAY Yes Margaretmary Dys, MD   folic acid-pyridoxine-cyancobalamin (FOLBIC) 2.5-25-2 MG TABS TAKE ONE TABLET BY MOUTH EVERY DAY Yes Margaretmary Dys, MD   MAGNESIUM PO Take by mouth Yes Historical Provider, MD   warfarin (COUMADIN) 1 MG tablet   Take 5 mg by mouth daily at 1800 Pt takes 7.5 mg every day except Monday and Thursday she takes 5 mg. Yes Historical Provider, MD     Allergies   Allergen Reactions   ??? Phenergan [Promethazine Hcl] Anaphylaxis   ??? Azithromycin Swelling   ??? Gluten Meal    ??? Lactose Diarrhea   ??? Orange Syrup    ??? Tomato    ??? Codeine Nausea And Vomiting   ??? Hydrocodone Nausea And Vomiting       OBJECTIVE:    BP (!) 128/94    Pulse 76    Ht 5' 1.8" (1.57 m)    Wt 171 lb 3.2 oz (77.7 kg)    SpO2 98%    BMI 31.52 kg/m??   BP Readings from Last 2 Encounters:   01/30/17 (!) 128/94   11/06/16 110/74     Wt Readings from Last 3 Encounters:   01/30/17 171 lb 3.2 oz (77.7 kg)   11/06/16 172 lb 3.2 oz (78.1 kg)   10/01/16 177 lb 6.4 oz (80.5 kg)       Physical Exam   Constitutional: She is oriented to person, place, and time. She appears well-developed and well-nourished.   HENT:   Head: Normocephalic and atraumatic.   Eyes: Conjunctivae and EOM are normal.   Neck: No tracheal deviation present.   Cardiovascular: Normal rate and regular rhythm.    Pulmonary/Chest: Effort normal and breath sounds normal.   Abdominal: Soft. There is no tenderness.   Musculoskeletal: She exhibits no edema.   Neurological: She is alert and oriented to person, place, and time.   Skin: Skin is warm and dry.   Psychiatric: She has a normal mood and affect.         ASSESSMENT/PLAN:    1. Controlled type 2 diabetes mellitus without complication, without long-term current use of insulin ([place])  Rpt labs w/ next visit approx 3-4 mo.  Every other day metformin for now.  If still well controlled next  check, would  stop metformin  - Hemoglobin A1C; Future  - BASIC METABOLIC PANEL  - Lipid Panel; Future    2. Protein S deficiency ([place])  inr low.  Inc to 7.5mg  m,w,f.  5mg  other days.  Rpt in 2 weeks  - POCT INR  - CBC Auto Differential; Future    3. Hypothyroidism, unspecified type  Rpt labs w/ next draw.  Cont meds  - TSH without Reflex; Future  - T4, Free; Future    4. Migraine without status migrainosus, not intractable, unspecified migraine type  Improved w/ propranolol.  Inc dose to further help palpitations.    - propranolol (INDERAL [place]) 60 MG extended release capsule; Take 1 capsule by mouth daily  Dispense: 30 capsule; Refill: 5    5. Hypomagnesemia  In the past.  Labs today.  Pt w/ some symptoms.   - MAGNESIUM          Scribe attestation: Asencion Noble, MA, am scribing for and in the presence of Margaretmary Dys, MD. Electronically signed by Alanson Aly, MA on 01/30/2017 at 2:31 PM      Provider attestation: I, Margaretmary Dys, MD  personally performed the services described in this documentation, as scribed by the user listed above in my presence, and it is both accurate and complete. I agree with the Chief Complaint, ROS, and Past Histories independently gathered by the clinical support staff and the remaining scribed note accurately describes my personal service to the patient.    01/30/2017    3:01 PM

## 2017-01-30 NOTE — Progress Notes (Signed)
Go to 7.5mg  m,w,f.  5mg  other days.  Informed in the office

## 2017-01-30 NOTE — Progress Notes (Signed)
Bmp and magnesium normal

## 2017-01-31 LAB — BASIC METABOLIC PANEL
Anion Gap: 12 (ref 3–16)
BUN: 13 mg/dL (ref 7–20)
CO2: 27 mmol/L (ref 21–32)
Calcium: 9.4 mg/dL (ref 8.3–10.6)
Chloride: 102 mmol/L (ref 99–110)
Creatinine: 0.8 mg/dL (ref 0.6–1.1)
GFR African American: >60 (ref >60)
GFR Non-African American: >60 (ref >60)
Glucose: 100 mg/dL — ABNORMAL HIGH (ref 70–99)
Potassium: 4.9 mmol/L (ref 3.5–5.1)
Sodium: 141 mmol/L (ref 136–145)

## 2017-01-31 LAB — MAGNESIUM: Magnesium: 2.1 mg/dL (ref 1.80–2.40)

## 2017-02-26 MED ORDER — ONDANSETRON HCL 4 MG PO TABS
4 MG | ORAL_TABLET | ORAL | 0 refills | Status: DC
Start: 2017-02-26 — End: 2017-06-26

## 2017-02-26 MED ORDER — ACYCLOVIR 400 MG PO TABS
400 MG | ORAL_TABLET | ORAL | 3 refills | Status: DC
Start: 2017-02-26 — End: 2017-07-31

## 2017-02-26 MED ORDER — FREESTYLE LITE TEST VI STRP
ORAL_STRIP | 11 refills | Status: DC
Start: 2017-02-26 — End: 2017-12-01

## 2017-03-31 ENCOUNTER — Encounter: Admit: 2017-03-31 | Discharge: 2017-03-31 | Payer: BLUE CROSS/BLUE SHIELD | Primary: Family Medicine

## 2017-03-31 ENCOUNTER — Encounter

## 2017-03-31 DIAGNOSIS — D6859 Other primary thrombophilia: Secondary | ICD-10-CM

## 2017-03-31 LAB — POCT PT/INR: INR,(POC): 2.3

## 2017-03-31 MED ORDER — THYROID 32.5 MG PO TABS
32.5 | ORAL_TABLET | ORAL | 0 refills | Status: DC
Start: 2017-03-31 — End: 2017-05-08

## 2017-03-31 MED ORDER — THYROID 32.5 MG PO TABS
32.5 MG | ORAL_TABLET | ORAL | 0 refills | Status: DC
Start: 2017-03-31 — End: 2017-03-31

## 2017-04-25 NOTE — Telephone Encounter (Signed)
Attempted to contact patient on 04/25/2017.     Result: left message on the patient's voicemail asking patient to return my call.    Pre-Visit planning not completed.       Adele Schilder  Patient Services Specialist  [phone]  [phone]

## 2017-04-29 ENCOUNTER — Encounter

## 2017-04-29 MED ORDER — NATURE-THROID 32.5 MG PO TABS
32.5 MG | ORAL_TABLET | ORAL | 0 refills | Status: DC
Start: 2017-04-29 — End: 2017-06-10

## 2017-04-29 NOTE — Telephone Encounter (Signed)
Attempted to contact patient on 04/29/2017.     Result: left message on the patient's voicemail asking patient to return my call.    Pre-Visit planning not completed.     Adele Schilder  Patient Services Specialist  [phone]  [phone]

## 2017-05-06 ENCOUNTER — Encounter: Primary: Family Medicine

## 2017-05-08 ENCOUNTER — Ambulatory Visit
Admit: 2017-05-08 | Discharge: 2017-05-08 | Payer: BLUE CROSS/BLUE SHIELD | Attending: Family Medicine | Primary: Family Medicine

## 2017-05-08 ENCOUNTER — Encounter

## 2017-05-08 DIAGNOSIS — E119 Type 2 diabetes mellitus without complications: Secondary | ICD-10-CM

## 2017-05-08 LAB — POCT PT/INR: INR,(POC): 2.1

## 2017-05-08 NOTE — Progress Notes (Signed)
INR at goal.  Continue current coumadin dose and repeat INR in 4 weeks

## 2017-05-08 NOTE — Progress Notes (Signed)
Sugars still well controlled.  Thyroid labs normal.  Lipids elevated.  Slightly low wbc, o/w cbc ok.  She had been on statin in the past.  Side effects?

## 2017-05-08 NOTE — Progress Notes (Signed)
Chief Complaint   Patient presents with   . Diabetes   . Chronic Pain       HPI:  Jasmine Strickland is a 46 y.o. (DOB: [birth year]) here today   for   Diabetes   She presents for her follow-up diabetic visit. She has type 2 diabetes mellitus. Her disease course has been stable. Hypoglycemia symptoms include dizziness and headaches. Pertinent negatives for hypoglycemia include no sweats. Pertinent negatives for diabetes include no blurred vision, no chest pain, no fatigue and no weakness. There are no hypoglycemic complications. Symptoms are stable. There are no diabetic complications. Risk factors for coronary artery disease include diabetes mellitus. She is compliant with treatment all of the time. Her weight is increasing steadily. When asked about meal planning, she reported none. She has not had a previous visit with a dietitian. She participates in exercise three times a week. There is no change in her home blood glucose trend. Her overall blood glucose range is 110-130 mg/dl. An ACE inhibitor/angiotensin II receptor blocker is not being taken. She does not see a podiatrist.Eye exam is not current.     Arm numbness intermittently upon waking in am, started 3-4 months ago, worsening over last few weeks, arms feel weak in mornings with a Heavy feeling. Areas on back down sides of spine numb, tingling feeling, no injuries. Numbness does not radiate into legs. Thoracic spine.  Comes and goes     Patient's medications, allergies, past medical, surgical, social and family histories were reviewed and updated as appropriate.    ROS:  Review of Systems   Constitutional: Negative for chills and fatigue.   Eyes: Negative for blurred vision.   Respiratory: Negative for chest tightness and shortness of breath.    Cardiovascular: Negative for chest pain and palpitations.   Gastrointestinal: Positive for constipation and diarrhea.   Neurological: Positive for dizziness, numbness and headaches. Negative for weakness and  light-headedness.           Hemoglobin A1C (%)   Date Value   07/30/2016 5.7     Microscopic Examination (no units)   Date Value   04/16/2014 Not Indicated     Microalbumin, Random Urine (mg/dL)   Date Value   11/06/2016 1.90     LDL Calculated (mg/dL)   Date Value   07/30/2016 110 (H)       Past Medical History:   Diagnosis Date   . Asthma    . Diabetes mellitus ([place])    . Fibromyalgia    . GERD (gastroesophageal reflux disease)    . Hypothyroidism    . Migraine    . Pleurisy     multiple episodes "several yrs ago"   . Protein S deficiency ([place])    . Pulmonary embolism ([place])    . Type II or unspecified type diabetes mellitus without mention of complication, not stated as uncontrolled        No family history on file.    Social History     Social History   . Marital status: Married     Spouse name: N/A   . Number of children: N/A   . Years of education: N/A     Occupational History   . Not on file.     Social History Main Topics   . Smoking status: Never Smoker   . Smokeless tobacco: Never Used   . Alcohol use No   . Drug use: No   . Sexual activity: Yes  Partners: Male     Other Topics Concern   . Not on file     Social History Narrative   . No narrative on file       Prior to Visit Medications    Medication Sig Taking? Authorizing Provider   NATURE-THROID 32.5 MG tablet TAKE 1 TABLET BY MOUTH EVERY DAY (MUST MAKE FOLLOW UP APPOINTMENT FOR MORE REFILLS) Yes Margaretmary Dys, MD   acyclovir (ZOVIRAX) 400 MG tablet TAKE 5 TABLETS BY MOUTH ONCE A DAY FOR 5 DAYS Yes Margaretmary Dys, MD   ondansetron (ZOFRAN) 4 MG tablet TAKE ONE TABLET BY MOUTH EVERY EIGHT HOURS AS NEEDED FOR NAUSEA Yes Margaretmary Dys, MD   FREESTYLE LITE strip USE TO TEST TWO TIMES A DAY Yes Margaretmary Dys, MD   propranolol (INDERAL LA) 60 MG extended release capsule Take 1 capsule by mouth daily Yes Margaretmary Dys, MD   cyanocobalamin 1000 MCG/ML injection Inject 1 mL into the muscle every 30 days Yes Margaretmary Dys, MD   SYRINGE/NEEDLE, DISP, 1 ML  (B-D SYRINGE/NEEDLE 1CC/25GX5/8) 25G X 5/8" 1 ML MISC Inject 1 Syringe into the skin every 30 days Yes Margaretmary Dys, MD   warfarin (COUMADIN) 5 MG tablet Take daily as directed Yes Margaretmary Dys, MD   methocarbamol (ROBAXIN) 500 MG tablet TAKE ONE TABLET THREE TIMES A DAY Yes Margaretmary Dys, MD   metFORMIN (GLUCOPHAGE) 500 MG tablet TAKE ONE TABLET BY MOUTH DAILY (WITH BREAKFAST) Yes Margaretmary Dys, MD   glucose blood VI test strips (FREESTYLE LITE) strip USE TO TEST TWO TIMES A DAY Yes Margaretmary Dys, MD   folic acid-pyridoxine-cyancobalamin (FOLBIC) 2.5-25-2 MG TABS TAKE ONE TABLET BY MOUTH EVERY DAY Yes Margaretmary Dys, MD   MAGNESIUM PO Take by mouth Yes Historical Provider, MD   warfarin (COUMADIN) 1 MG tablet   Take 5 mg by mouth daily at 1800 Pt takes 7.5 mg every day except Monday and Thursday she takes 5 mg. Yes Historical Provider, MD       Allergies   Allergen Reactions   . Phenergan [Promethazine Hcl] Anaphylaxis   . Azithromycin Swelling   . Gluten Meal    . Lactose Diarrhea   . Orange Syrup    . Tomato    . Codeine Nausea And Vomiting   . Hydrocodone Nausea And Vomiting       OBJECTIVE:    BP 110/68   Pulse 70   Ht 5' 1.8" (1.57 m)   Wt 185 lb 3.2 oz (84 kg)   SpO2 99%   BMI 34.09 kg/m     BP Readings from Last 2 Encounters:   05/08/17 110/68   01/30/17 (!) 128/94       Wt Readings from Last 3 Encounters:   05/08/17 185 lb 3.2 oz (84 kg)   01/30/17 171 lb 3.2 oz (77.7 kg)   11/06/16 172 lb 3.2 oz (78.1 kg)       Physical Exam   Constitutional: She is oriented to person, place, and time. She appears well-developed and well-nourished.   HENT:   Head: Normocephalic and atraumatic.   Eyes: Conjunctivae and EOM are normal.   Neck: No tracheal deviation present.   Cardiovascular: Normal rate and regular rhythm.    Pulmonary/Chest: Effort normal and breath sounds normal.   Abdominal: Soft. There is no tenderness.   Musculoskeletal: She exhibits no edema.        Thoracic back: She exhibits no spasm.    Neurological: She is alert and oriented  to person, place, and time. She has normal strength.   Skin: Skin is warm and dry.   Psychiatric: She has a normal mood and affect.         ASSESSMENT/PLAN:    1. Controlled type 2 diabetes mellitus without complication, without long-term current use of insulin ([place])  Rpt labs today.  Well controlled last check  - Hemoglobin A1C  - Lipid Panel    2. Hypothyroidism, unspecified type  Rpt labs today  - TSH without Reflex  - T4, Free    3. Fibromyalgia  Ongoing issues.  Seems near baseline    4. Protein S deficiency ([place])  INR at goal.  Continue current coumadin dose and repeat INR in 4 weeks   - POCT INR  - CBC Auto Differential    5. Migraine without status migrainosus, not intractable, unspecified migraine type  Ongoing issues.  Near baseline  - External Referral To Neurology    6. Numbness of arm  Numbness as described to mid back and down arms.  Denies "pins and needles." denies worsening pain.  Refer to neuro for further eval and treatment  - External Referral To Neurology          Scribe attestation: I, Lauretta Grill RMA, am scribing for and in the presence of Margaretmary Dys, MD. Electronically signed by Lauretta Grill RMA on 05/08/2017 at 1:15 PM      Provider attestation: I, Margaretmary Dys, MD, personally performed the services scribed by the user listed above in my presence, and it is both accurate and complete. I agree with the ROS and Past Histories independently gathered by the clinical support staff and the remaining scribed note accurately describes my personal service to the patient.    @SIGNATURE @    05/08/2017  1:18 PM

## 2017-05-09 LAB — CBC WITH AUTO DIFFERENTIAL
Basophils %: 0.7 %
Basophils Absolute: 0 10*3/uL (ref 0.0–0.2)
Eosinophils %: 2.4 %
Eosinophils Absolute: 0.1 10*3/uL (ref 0.0–0.6)
Hematocrit: 40.7 % (ref 36.0–48.0)
Hemoglobin: 13.8 g/dL (ref 12.0–16.0)
Lymphocytes %: 41.7 %
Lymphocytes Absolute: 1.4 10*3/uL (ref 1.0–5.1)
MCH: 31.3 pg (ref 26.0–34.0)
MCHC: 33.9 g/dL (ref 31.0–36.0)
MCV: 92.4 fL (ref 80.0–100.0)
MPV: 10.1 fL (ref 5.0–10.5)
Monocytes %: 9.6 %
Monocytes Absolute: 0.3 10*3/uL (ref 0.0–1.3)
Neutrophils %: 45.6 %
Neutrophils Absolute: 1.6 10*3/uL — ABNORMAL LOW (ref 1.7–7.7)
Platelets: 245 10*3/uL (ref 135–450)
RBC: 4.41 M/uL (ref 4.00–5.20)
RDW: 13.3 % (ref 12.4–15.4)
WBC: 3.5 10*3/uL — ABNORMAL LOW (ref 4.0–11.0)

## 2017-05-09 LAB — LIPID PANEL
Cholesterol, Total: 219 mg/dL — ABNORMAL HIGH (ref 0–199)
HDL: 53 mg/dL (ref 40–60)
LDL Calculated: 141 mg/dL — ABNORMAL HIGH (ref <100)
Triglycerides: 123 mg/dL (ref 0–150)
VLDL Cholesterol Calculated: 25 mg/dL

## 2017-05-09 LAB — T4, FREE: T4 Free: 1.3 ng/dL (ref 0.9–1.8)

## 2017-05-09 LAB — HEMOGLOBIN A1C
Hemoglobin A1C: 5.8 %
eAG: 119.8 mg/dL

## 2017-05-09 LAB — TSH: TSH: 1.39 u[IU]/mL (ref 0.27–4.20)

## 2017-05-14 NOTE — Telephone Encounter (Signed)
Will you order nerve test before they see neuro so they will have those results when she see's neuro in July ??

## 2017-05-14 NOTE — Telephone Encounter (Signed)
Ok for bue EMG

## 2017-05-14 NOTE — Telephone Encounter (Signed)
Looks like pt seeing Dr Fortino Sic. Will call there office in the AM. I believe they do EMG's

## 2017-05-15 ENCOUNTER — Encounter

## 2017-05-15 NOTE — Telephone Encounter (Signed)
Pt scheduled for 05/24/17 at 3pm. Pt informed

## 2017-05-19 NOTE — Progress Notes (Signed)
Yes please

## 2017-05-19 NOTE — Progress Notes (Signed)
EMG mildly abnormal.  Not consistent with carpal tunnel.  May be related to radiculopathy from the cervical spine.  Recommend MRI of the spine to evaluate further.

## 2017-05-20 ENCOUNTER — Encounter

## 2017-05-24 ENCOUNTER — Inpatient Hospital Stay: Admit: 2017-05-24 | Attending: Family Medicine | Primary: Family Medicine

## 2017-05-24 DIAGNOSIS — N63 Unspecified lump in unspecified breast: Secondary | ICD-10-CM

## 2017-05-24 NOTE — Progress Notes (Signed)
Disc and osteophyte toward the left, causing narrowing where the nerve exits from left C5, C6 region.  Additional mild narrowing at C4, C5 region.  I believe she has a neurology appointment.  Another option would be Ortho referral.

## 2017-06-10 ENCOUNTER — Encounter

## 2017-06-10 MED ORDER — THYROID 32.5 MG PO TABS
32.5 MG | ORAL_TABLET | ORAL | 5 refills | Status: DC
Start: 2017-06-10 — End: 2017-10-22

## 2017-06-26 MED ORDER — ONDANSETRON HCL 4 MG PO TABS
4 MG | ORAL_TABLET | ORAL | 1 refills | Status: DC
Start: 2017-06-26 — End: 2017-12-01

## 2017-07-15 LAB — CREATININE
Age Time: 44 a
Creatinine: 0.87 mg/dL (ref 0.55–1.02)
GFR African American: 86 mL/min
GFR Non-African American: 71 mL/min

## 2017-07-15 LAB — BUN: BUN: 16 mg/dL (ref 7–20)

## 2017-07-28 NOTE — Progress Notes (Signed)
Please make sure test has been addressed by the ordering provider

## 2017-07-30 DIAGNOSIS — R079 Chest pain, unspecified: Secondary | ICD-10-CM

## 2017-07-30 NOTE — ED Notes (Addendum)
2336- Perfect Serve message sent to Dr. Veneta Penton [facility]  07/30/17 2339    2346~ Dr. Reece Leader returned call to Dr. Collier Salina M [facility] Duignan  07/30/17 [phone]

## 2017-07-30 NOTE — ED Provider Notes (Signed)
Triage Chief Complaint:   Chest Pain (pt. states she has been having chest pain and pressure x3 days, states she thought it was heartburn but tonight she was having jaw pain and dental pain)      HOPI:  Jasmine Strickland is a 46 y.o. female that presents with substernal chest pain that radiated to her jaw and neck and made her short of breath.  Patient has been evaluated for chest pain in the past and had a PE she is currently on Coumadin.  She has protein S deficiency.  She is also diabetic has thyroid disease and has chronic tachycardia on propranolol.  She did not have the lateral leg swelling hemoptysis or known malignancy.  Patient does not have known coronary artery disease.  Symptoms began several hours prior to arrival in the emergency department and her pain had improved prior to my exam.    ROS:  Review of systems was reviewed for 10 systems and is otherwise negative except as in the [place]    Past Medical History:   Diagnosis Date   ??? Asthma    ??? Diabetes mellitus ([place])    ??? Fibromyalgia    ??? GERD (gastroesophageal reflux disease)    ??? Hypothyroidism    ??? Migraine    ??? Pleurisy     multiple episodes "several yrs ago"   ??? Protein S deficiency ([place])    ??? Pulmonary embolism ([place])    ??? Type II or unspecified type diabetes mellitus without mention of complication, not stated as uncontrolled      Past Surgical History:   Procedure Laterality Date   ??? CHOLECYSTECTOMY     ??? ENDOMETRIAL ABLATION     ??? OTHER SURGICAL HISTORY  12/23/2012    laparoscopic cholecystectomy   ??? PELVIC LAPAROSCOPY       History reviewed. No pertinent family history.  Social History     Social History   ??? Marital status: Married     Spouse name: N/A   ??? Number of children: N/A   ??? Years of education: N/A     Occupational History   ??? Not on file.     Social History Main Topics   ??? Smoking status: Never Smoker   ??? Smokeless tobacco: Never Used   ??? Alcohol use No   ??? Drug use: No   ??? Sexual activity: Yes     Partners: Male     Other Topics  Concern   ??? Not on file     Social History Narrative   ??? No narrative on file     No current facility-administered medications for this encounter.      Current Outpatient Prescriptions   Medication Sig Dispense Refill   ??? ondansetron (ZOFRAN) 4 MG tablet TAKE ONE TABLET BY MOUTH EVERY EIGHT HOURS AS NEEDED FOR NAUSEA 60 tablet 1   ??? thyroid (NATURE-THROID) 32.5 MG tablet Take 1 tablet by mouth daily 30 tablet 5   ??? acyclovir (ZOVIRAX) 400 MG tablet TAKE 5 TABLETS BY MOUTH ONCE A DAY FOR 5 DAYS 25 tablet 3   ??? FREESTYLE LITE strip USE TO TEST TWO TIMES A DAY 100 strip 11   ??? propranolol (INDERAL LA) 60 MG extended release capsule Take 1 capsule by mouth daily 30 capsule 5   ??? cyanocobalamin 1000 MCG/ML injection Inject 1 mL into the muscle every 30 days 1 mL 11   ??? SYRINGE/NEEDLE, DISP, 1 ML (B-D SYRINGE/NEEDLE 1CC/25GX5/8) 25G X 5/8" 1 ML  MISC Inject 1 Syringe into the skin every 30 days 1 each 11   ??? warfarin (COUMADIN) 5 MG tablet Take daily as directed 60 tablet 5   ??? methocarbamol (ROBAXIN) 500 MG tablet TAKE ONE TABLET THREE TIMES A DAY 60 tablet 5   ??? metFORMIN (GLUCOPHAGE) 500 MG tablet TAKE ONE TABLET BY MOUTH DAILY (WITH BREAKFAST) 30 tablet 5   ??? glucose blood VI test strips (FREESTYLE LITE) strip USE TO TEST TWO TIMES A DAY 440 each 5   ??? folic acid-pyridoxine-cyancobalamin (FOLBIC) 2.5-25-2 MG TABS TAKE ONE TABLET BY MOUTH EVERY DAY 30 tablet 5   ??? MAGNESIUM PO Take by mouth     ??? warfarin (COUMADIN) 1 MG tablet   Take 5 mg by mouth daily at 1800 Pt takes 7.5 mg every day except Monday and Thursday she takes 5 mg.       Allergies   Allergen Reactions   ??? Phenergan [Promethazine Hcl] Anaphylaxis   ??? Azithromycin Swelling   ??? Gluten Meal    ??? Lactose Diarrhea   ??? Orange Syrup    ??? Tomato    ??? Codeine Nausea And Vomiting   ??? Hydrocodone Nausea And Vomiting     Nursing Notes Reviewed    Physical Exam:  ED Triage Vitals [07/30/17 2206]   Enc Vitals Group      BP (!) 133/90      Pulse 71      Resp 18      Temp  97.1 ??F (36.2 ??C)      Temp Source Oral      SpO2 99 %      Weight 180 lb (81.6 kg)      Height 5\' 2"  (1.575 m)      Head Circumference       Peak Flow       Pain Score       Pain Loc       Pain Edu?       Excl. in [place]?        GENERAL APPEARANCE: A well-developed well-nourished pleasant uncomfortable 46 year old female in moderate distress   HEAD: Normocephalic, atraumatic  EYES: Sclera anicteric.no conjunctival injection  NECK: Supple, no meningismus, no JVD  HEART: RRR without rubs murmurs or gallops, pulses 2+ and equal carotid radial and dorsalis pedis  LUNGS:  Clear good air movement no wheezing no retraction or accessory muscle use,  ABDOMEN: Soft, non-tender to palpation, no guarding or rebound., no mass or distention and no hepatosplenomegaly. Clearly no evidence of an acute abdomen or peritoneal signs at time of exam   EXTREMITIES: No acute deformities, no peripheral edema  SKIN: Warm and dry. Normal color, no rash,  capillary refill less than 2 seconds  MENTAL STATUS: Alert, oriented, interactive,   NEUROLOGICAL:  No facial drooping. moves all 4 extremities, sensation intact, no focal findings     Nursing note and vital signs reviewed     I have reviewed and interpreted all of the currently available lab results from this visit (if applicable):  Results for orders placed or performed during the hospital encounter of 07/30/17   CBC Auto Differential   Result Value Ref Range    WBC 6.2 4.0 - 11.0 K/uL    RBC 4.33 4.00 - 5.20 M/uL    Hemoglobin 13.5 12.0 - 16.0 g/dL    Hematocrit 40.0 36.0 - 48.0 %    MCV 92.2 80.0 - 100.0 fL    MCH 31.2 26.0 -  34.0 pg    MCHC 33.8 31.0 - 36.0 g/dL    RDW 13.1 12.4 - 15.4 %    Platelets 261 135 - 450 K/uL    MPV 9.6 5.0 - 10.5 fL    Neutrophils % 51.3 %    Lymphocytes % 38.4 %    Monocytes % 7.4 %    Eosinophils % 1.7 %    Basophils % 1.2 %    Neutrophils # 3.2 1.7 - 7.7 K/uL    Lymphocytes # 2.4 1.0 - 5.1 K/uL    Monocytes # 0.5 0.0 - 1.3 K/uL    Eosinophils # 0.1 0.0 - 0.6  K/uL    Basophils # 0.1 0.0 - 0.2 K/uL   Comprehensive Metabolic Panel   Result Value Ref Range    Sodium 136 136 - 145 mmol/L    Potassium 4.4 3.5 - 5.1 mmol/L    Chloride 102 99 - 110 mmol/L    CO2 26 21 - 32 mmol/L    Anion Gap 8 3 - 16    Glucose 83 70 - 99 mg/dL    BUN 8 7 - 20 mg/dL    CREATININE 0.7 0.6 - 1.1 mg/dL    GFR Non-African American >60 >60    GFR African American >60 >60    Calcium 9.3 8.3 - 10.6 mg/dL    Total Protein 7.1 6.4 - 8.2 g/dL    Alb 4.1 3.4 - 5.0 g/dL    Albumin/Globulin Ratio 1.4 1.1 - 2.2    Total Bilirubin 0.3 0.0 - 1.0 mg/dL    Alkaline Phosphatase 106 40 - 129 U/L    ALT 13 10 - 40 U/L    AST 16 15 - 37 U/L    Globulin 3.0 g/dL   Protime-INR   Result Value Ref Range    Protime 23.7 (H) 9.8 - 13.0 sec    INR 2.08 (H) 0.86 - 1.14   APTT   Result Value Ref Range    aPTT 39.7 (H) 26.0 - 36.0 sec   Troponin   Result Value Ref Range    Troponin <0.01 <0.01 ng/mL   hCG, quantitative, pregnancy   Result Value Ref Range    hCG Quant <5.0 <5.0 mIU/mL   EKG 12 Lead   Result Value Ref Range    Ventricular Rate 66 BPM    Atrial Rate 66 BPM    P-R Interval 160 ms    QRS Duration 84 ms    Q-T Interval 380 ms    QTc Calculation (Bazett) 398 ms    P Axis 13 degrees    R Axis -3 degrees    T Axis 15 degrees    Diagnosis       Normal sinus rhythmCannot rule out Anterior infarct , age undeterminedAbnormal ECGNo previous ECGs available        Radiographs (if obtained):  []  Radiologist's Report Reviewed:  XR CHEST PORTABLE   Final Result   Stable portable study.             []  Discussed with Radiologist:     []  The following radiograph was interpreted by myself in the absence of a radiologist:     EKG (if obtained): (All EKG's are interpreted by myself in the absence of a cardiologist)  EKG demonstrates a sinus rhythm, no acute ischemic changes or arrhythmia normal axis normal intervals and no evidence of an acute MI.  Heart rate is 66 and much like her previous EKG  MDM:   Patient with acute chest  pain presents to the ER for evaluation.  Her pain is highly suspicious for coronary artery disease.  She is therapeutic on Coumadin and her EKG showed only nonspecific ST-T wave changes.  Heart score is 4.  I'll arrange to get her admitted for further evaluation.  She is pain free after nitroglycerin and aspirin in the emergency department.     Final Impression:  1. Chest pain, unspecified type        Critical Care:     Patient had a critical-care time of 36 minutes including time to bedside time evaluating lab studies and in discussion with consultants.  This does not include any billable procedures.  Concern for this patient was ACS and the treatment is nitroglycerin aspirin and admission    Disposition referral (if applicable):  No follow-up provider specified.    Disposition medications (if applicable):  New Prescriptions    No medications on file       Comment: Please note this report has been produced using speech recognition software and may contain errors related to that system including errors in grammar, punctuation, and spelling, as well as words and phrases that may be inappropriate. If there are any questions or concerns please feel free to contact the dictating provider for clarification.      (Please note that portions of this note may have been completed with a voice recognition program. Efforts were made to edit the dictations but occasionally words are mis-transcribed.)    Jani Files, MD           Jani Files., MD  07/30/17 [phone]

## 2017-07-31 ENCOUNTER — Inpatient Hospital Stay
Admission: EM | Admit: 2017-07-31 | Discharge: 2017-07-31 | Disposition: A | Discharge status: Home / Self Care | Payer: BLUE CROSS/BLUE SHIELD | Source: Other Acute Inpatient Hospital | Admitting: Internal Medicine

## 2017-07-31 LAB — URINALYSIS
Bilirubin Urine: NEGATIVE
Blood, Urine: NEGATIVE
Glucose, Ur: NEGATIVE mg/dL
Ketones, Urine: NEGATIVE mg/dL
Leukocyte Esterase, Urine: NEGATIVE
Nitrite, Urine: NEGATIVE
Protein, UA: NEGATIVE mg/dL
Specific Gravity, UA: 1.01 (ref 1.005–1.030)
Urobilinogen, Urine: 0.2 E.U./dL (ref <2.0)
pH, UA: 6 (ref 5.0–8.0)

## 2017-07-31 LAB — COMPREHENSIVE METABOLIC PANEL
ALT: 13 U/L (ref 10–40)
AST: 16 U/L (ref 15–37)
Albumin/Globulin Ratio: 1.4 (ref 1.1–2.2)
Albumin: 4.1 g/dL (ref 3.4–5.0)
Alkaline Phosphatase: 106 U/L (ref 40–129)
Anion Gap: 8 (ref 3–16)
BUN: 8 mg/dL (ref 7–20)
CO2: 26 mmol/L (ref 21–32)
Calcium: 9.3 mg/dL (ref 8.3–10.6)
Chloride: 102 mmol/L (ref 99–110)
Creatinine: 0.7 mg/dL (ref 0.6–1.1)
GFR African American: >60 (ref >60)
GFR Non-African American: >60 (ref >60)
Globulin: 3 g/dL
Glucose: 83 mg/dL (ref 70–99)
Potassium: 4.4 mmol/L (ref 3.5–5.1)
Sodium: 136 mmol/L (ref 136–145)
Total Bilirubin: 0.3 mg/dL (ref 0.0–1.0)
Total Protein: 7.1 g/dL (ref 6.4–8.2)

## 2017-07-31 LAB — ECHOCARDIOGRAM COMPLETE 2D W DOPPLER W COLOR: Left Ventricular Ejection Fraction: 58

## 2017-07-31 LAB — NM MYOCARDIAL SPECT REST EXERCISE OR RX: Left Ventricular Ejection Fraction: 77

## 2017-07-31 LAB — PROTIME-INR
INR: 2.08 — ABNORMAL HIGH (ref 0.86–1.14)
INR: 2.06 — ABNORMAL HIGH (ref 0.86–1.14)
Protime: 23.7 s — ABNORMAL HIGH (ref 9.8–13.0)
Protime: 23.5 s — ABNORMAL HIGH (ref 9.8–13.0)

## 2017-07-31 LAB — CBC WITH AUTO DIFFERENTIAL
Basophils %: 1.2 %
Basophils Absolute: 0.1 10*3/uL (ref 0.0–0.2)
Eosinophils %: 1.7 %
Eosinophils Absolute: 0.1 10*3/uL (ref 0.0–0.6)
Hematocrit: 40 % (ref 36.0–48.0)
Hemoglobin: 13.5 g/dL (ref 12.0–16.0)
Lymphocytes %: 38.4 %
Lymphocytes Absolute: 2.4 10*3/uL (ref 1.0–5.1)
MCH: 31.2 pg (ref 26.0–34.0)
MCHC: 33.8 g/dL (ref 31.0–36.0)
MCV: 92.2 fL (ref 80.0–100.0)
MPV: 9.6 fL (ref 5.0–10.5)
Monocytes %: 7.4 %
Monocytes Absolute: 0.5 10*3/uL (ref 0.0–1.3)
Neutrophils %: 51.3 %
Neutrophils Absolute: 3.2 10*3/uL (ref 1.7–7.7)
Platelets: 261 10*3/uL (ref 135–450)
RBC: 4.33 M/uL (ref 4.00–5.20)
RDW: 13.1 % (ref 12.4–15.4)
WBC: 6.2 10*3/uL (ref 4.0–11.0)

## 2017-07-31 LAB — POCT GLUCOSE
POC Glucose: 99 mg/dl (ref 70–99)
POC Glucose: 102 mg/dl — ABNORMAL HIGH (ref 70–99)
POC Glucose: 91 mg/dl (ref 70–99)
POC Glucose: 103 mg/dl — ABNORMAL HIGH (ref 70–99)

## 2017-07-31 LAB — TROPONIN
Troponin: <0.01 ng/mL (ref <0.01)
Troponin: <0.01 ng/mL (ref <0.01)

## 2017-07-31 LAB — APTT: aPTT: 39.7 s — ABNORMAL HIGH (ref 26.0–36.0)

## 2017-07-31 LAB — HCG, QUANTITATIVE, PREGNANCY: hCG Quant: <5 m[IU]/mL (ref <5.0)

## 2017-07-31 MED ORDER — PROPRANOLOL HCL ER 60 MG PO CP24
60 MG | Freq: Every day | ORAL | Status: DC
Start: 2017-07-31 — End: 2017-07-31

## 2017-07-31 MED ORDER — NITROGLYCERIN 2 % TD OINT
2 % | Freq: Once | TRANSDERMAL | Status: AC
Start: 2017-07-31 — End: 2017-07-30
  Administered 2017-07-31: 03:00:00 1 [in_us] via TOPICAL

## 2017-07-31 MED ORDER — NORMAL SALINE FLUSH 0.9 % IV SOLN
0.9 % | Freq: Two times a day (BID) | INTRAVENOUS | Status: DC
Start: 2017-07-31 — End: 2017-07-31
  Administered 2017-07-31: 12:00:00 10 mL via INTRAVENOUS

## 2017-07-31 MED ORDER — POTASSIUM CHLORIDE 10 MEQ/100ML IV SOLN
10 MEQ/0ML | INTRAVENOUS | Status: DC | PRN
Start: 2017-07-31 — End: 2017-07-31

## 2017-07-31 MED ORDER — TECHNETIUM TC 99M TETROFOSMIN IV KIT
Freq: Once | INTRAVENOUS | Status: AC | PRN
Start: 2017-07-31 — End: 2017-07-31
  Administered 2017-07-31: 17:00:00 32.2 via INTRAVENOUS

## 2017-07-31 MED ORDER — NITROGLYCERIN 0.4 MG SL SUBL
0.4 MG | SUBLINGUAL | Status: DC | PRN
Start: 2017-07-31 — End: 2017-07-31

## 2017-07-31 MED ORDER — PANTOPRAZOLE SODIUM 40 MG IV SOLR
40 MG | Freq: Every day | INTRAVENOUS | Status: DC
Start: 2017-07-31 — End: 2017-07-31
  Administered 2017-07-31: 14:00:00 40 mg via INTRAVENOUS

## 2017-07-31 MED ORDER — WARFARIN SODIUM 7.5 MG PO TABS
7.5 MG | Freq: Once | ORAL | Status: AC
Start: 2017-07-31 — End: 2017-07-31
  Administered 2017-07-31: 05:00:00 7.5 mg via ORAL

## 2017-07-31 MED ORDER — WARFARIN DAILY DOSING BY PHARMACIST (PLACEHOLDER)
Status: DC
Start: 2017-07-31 — End: 2017-07-31

## 2017-07-31 MED ORDER — ATORVASTATIN CALCIUM 10 MG PO TABS
10 MG | Freq: Every evening | ORAL | Status: DC
Start: 2017-07-31 — End: 2017-07-31

## 2017-07-31 MED ORDER — MAGNESIUM SULFATE IN D5W 1-5 GM/100ML-% IV SOLN
1-5 GM/100ML-% | INTRAVENOUS | Status: DC | PRN
Start: 2017-07-31 — End: 2017-07-31

## 2017-07-31 MED ORDER — METHOCARBAMOL 500 MG PO TABS
500 MG | Freq: Three times a day (TID) | ORAL | Status: DC | PRN
Start: 2017-07-31 — End: 2017-07-31
  Administered 2017-07-31: 05:00:00 500 mg via ORAL

## 2017-07-31 MED ORDER — ACETAMINOPHEN 325 MG PO TABS
325 MG | ORAL | Status: DC | PRN
Start: 2017-07-31 — End: 2017-07-31
  Administered 2017-07-31: 14:00:00 650 mg via ORAL

## 2017-07-31 MED ORDER — ASPIRIN 81 MG PO CHEW
81 MG | Freq: Once | ORAL | Status: AC
Start: 2017-07-31 — End: 2017-07-30
  Administered 2017-07-31: 03:00:00 324 mg via ORAL

## 2017-07-31 MED ORDER — ONDANSETRON HCL 4 MG/2ML IJ SOLN
4 MG/2ML | Freq: Four times a day (QID) | INTRAMUSCULAR | Status: DC | PRN
Start: 2017-07-31 — End: 2017-07-31

## 2017-07-31 MED ORDER — THYROID 30 MG PO TABS
30 MG | Freq: Every day | ORAL | Status: DC
Start: 2017-07-31 — End: 2017-07-31
  Administered 2017-07-31: 12:00:00 30 mg via ORAL

## 2017-07-31 MED ORDER — ASPIRIN 81 MG PO CHEW
81 MG | Freq: Every day | ORAL | Status: DC
Start: 2017-07-31 — End: 2017-07-31
  Administered 2017-07-31: 12:00:00 81 mg via ORAL

## 2017-07-31 MED ORDER — NORMAL SALINE FLUSH 0.9 % IV SOLN
0.9 % | INTRAVENOUS | Status: DC | PRN
Start: 2017-07-31 — End: 2017-07-31

## 2017-07-31 MED ORDER — TECHNETIUM TC 99M TETROFOSMIN IV KIT
Freq: Once | INTRAVENOUS | Status: AC | PRN
Start: 2017-07-31 — End: 2017-07-31
  Administered 2017-07-31: 15:00:00 10.2 via INTRAVENOUS

## 2017-07-31 MED FILL — METHOCARBAMOL 500 MG PO TABS: 500 MG | ORAL | Qty: 1

## 2017-07-31 MED FILL — PROPRANOLOL HCL ER 60 MG PO CP24: 60 MG | ORAL | Qty: 1

## 2017-07-31 MED FILL — ASPIRIN 81 MG PO CHEW: 81 MG | ORAL | Qty: 4

## 2017-07-31 MED FILL — SODIUM CHLORIDE FLUSH 0.9 % IV SOLN: 0.9 % | INTRAVENOUS | Qty: 10

## 2017-07-31 MED FILL — TYLENOL 325 MG PO TABS: 325 MG | ORAL | Qty: 2

## 2017-07-31 MED FILL — NITRO-BID 2 % TD OINT: 2 % | TRANSDERMAL | Qty: 1

## 2017-07-31 MED FILL — PROTONIX 40 MG IV SOLR: 40 MG | INTRAVENOUS | Qty: 40

## 2017-07-31 MED FILL — JANTOVEN 7.5 MG PO TABS: 7.5 MG | ORAL | Qty: 1

## 2017-07-31 MED FILL — ARMOUR THYROID 30 MG PO TABS: 30 MG | ORAL | Qty: 1

## 2017-07-31 MED FILL — ASPIRIN 81 MG PO CHEW: 81 MG | ORAL | Qty: 1

## 2017-07-31 NOTE — Progress Notes (Signed)
Transfer of care given to [place] RN at bedside.

## 2017-07-31 NOTE — Discharge Instructions (Signed)

## 2017-07-31 NOTE — Progress Notes (Signed)
Pt admitted to PCU, rm 315-1. Pt a/o x4 VSS Resp e/e. Admission assessment completed and charted. Pt oriented to rm/facility. Call light in reach. Will monitor.  Pt refused pneumonia immunization.

## 2017-07-31 NOTE — Progress Notes (Signed)
Pt discharged. Reviewed discharge instructions with patient. Deny further questions regarding instructions. IV removed per discharge. Pt leaving in stable condition via wheelchair and transport. Pt denies further needs. Belongings sent with patient.

## 2017-07-31 NOTE — Progress Notes (Signed)
Cardiology consult has been called to Dr. Lyndel Safe on 07/31/17 through answering service @ [phone]. Levin Bacon, PCA/MT

## 2017-07-31 NOTE — Progress Notes (Signed)
Verbal order for robaxin 500mg  three times daily as needed per Dr Reece Leader.

## 2017-07-31 NOTE — Progress Notes (Signed)
Pt arrived back from stress via wheelchair into room 315-bed 1. Pt tolerated well. States numbness and tingling located in face at this time. Vitals obtained see flowsheets. FSBS 91. Will continue to monitor,

## 2017-07-31 NOTE — Progress Notes (Signed)
4 Eyes Skin Assessment     The patient is being assess for   Admission    I agree that 2 RN's have performed a thorough Head to Toe Skin Assessment on the patient. ALL assessment sites listed below have been assessed.      Areas assessed by both nurses:   [x]    Head, Face, and Ears   [x]    Shoulders, Back, and Chest, Abdomen  [x]    Arms, Elbows, and Hands   [x]    Coccyx, Sacrum, and Ischium  [x]    Legs, Feet, and Heels        Scattered bruise and cut on hand between thumb and first finger.    **SHARE this note so that the co-signing nurse is able to place an eSignature**    Co-signer eSignature: Electronically signed by Jinger Neighbors, RN on 07/31/17 at 1:18 AM    Does the Patient have Skin Breakdown?  No          [place] Prevention initiated:  No   Wound Care Orders initiated:  No      WOC nurse consulted for Pressure Injury (Stage 3,4, Unstageable, DTI, NWPT, Complex wounds)and New or Established Ostomies:  No      Primary Nurse eSignature: Electronically signed by Karin Golden, RN on 07/31/17 at 12:50 AM

## 2017-07-31 NOTE — Progress Notes (Signed)
Pt completed Myoview stress pt tolerated well no c/o's chest pain some SOB resolved in recovery . Pt to return to PCU post stress scan . Update called to PCU staff [facility] RN

## 2017-07-31 NOTE — Plan of Care (Signed)
Problem: Infection:  Goal: Will remain free from infection  Will remain free from infection   Outcome: Ongoing      Problem: Daily Care:  Goal: Daily care needs are met  Daily care needs are met   Outcome: Ongoing      Problem: Pain:  Goal: Patient's pain/discomfort is manageable  Patient's pain/discomfort is manageable   Outcome: Ongoing

## 2017-07-31 NOTE — Discharge Summary (Signed)
See combined H&P and d/c summary

## 2017-07-31 NOTE — Consults (Signed)
[place]  [phone]        Reason for Consultation/Chief Complaint: "I have been having ."    History of Present Illness:  Jasmine Strickland is a 46 y.o.  patient presented to hospital with chest pain.  Patient who presented to the hospital with complaints of chest pain.   Pain is substernal radiating to jaw associated with nausea. She had shortness of breath.  It started with exertion lasting 5-15 minutes resolve on its own. Indigestion type of feeling in stomach. No vomiting.  No fever chills or cough.  She was helping a friend clean out the garage in [place] when pain started on day before yesterday. It is pressure type and got really bad last night radiating to jaw.  I have been asked to provide consultation regarding further management and testing.      Past Medical History:  She has PMH DM x 2 yrs, hypothyroidism, protein S deficiency x 13 yrs PE x 11 yrs remains on coumadin   has a past medical history of Asthma; Diabetes mellitus ([place]); Fibromyalgia; GERD (gastroesophageal reflux disease); Hypothyroidism; IBS (irritable bowel syndrome); Migraine; Pleurisy; Protein S deficiency ([place]); Pulmonary embolism ([place]); and Type II or unspecified type diabetes mellitus without mention of complication, not stated as uncontrolled.    Surgical History:   has a past surgical history that includes pelvic laparoscopy; other surgical history (12/23/2012); Cholecystectomy; and Endometrial ablation.     Social History:   She is married, with 8 children lives with her family in [place].  She reports that she has never smoked. She has never used smokeless tobacco. She reports that she does not drink alcohol or use drugs.     Family History: Dad MI age 78 MI age 1  family history is not on file.     Home Medications:  Were reviewed and are listed in nursing record. and/or listed below  Prior to Admission medications    Medication Sig Start Date End Date Taking? Authorizing Provider   ondansetron  (ZOFRAN) 4 MG tablet TAKE ONE TABLET BY MOUTH EVERY EIGHT HOURS AS NEEDED FOR NAUSEA 06/26/17  Yes Margaretmary Dys, MD   thyroid (NATURE-THROID) 32.5 MG tablet Take 1 tablet by mouth daily 06/10/17  Yes Margaretmary Dys, MD   propranolol (INDERAL LA) 60 MG extended release capsule Take 1 capsule by mouth daily 01/30/17  Yes Margaretmary Dys, MD   cyanocobalamin 1000 MCG/ML injection Inject 1 mL into the muscle every 30 days 08/01/16  Yes Margaretmary Dys, MD   warfarin (COUMADIN) 5 MG tablet Take daily as directed 07/30/16  Yes Margaretmary Dys, MD   methocarbamol (ROBAXIN) 500 MG tablet TAKE ONE TABLET THREE TIMES A DAY 07/30/16  Yes Margaretmary Dys, MD   metFORMIN (GLUCOPHAGE) 500 MG tablet TAKE ONE TABLET BY MOUTH DAILY (WITH BREAKFAST) 07/30/16  Yes Margaretmary Dys, MD   folic acid-pyridoxine-cyancobalamin (FOLBIC) 2.5-25-2 MG TABS TAKE ONE TABLET BY MOUTH EVERY DAY 07/30/16  Yes Margaretmary Dys, MD   MAGNESIUM PO Take by mouth   Yes Historical Provider, MD   warfarin (COUMADIN) 1 MG tablet   Take 5 mg by mouth daily at 1800 Pt takes 7.5 mg every day except Monday and Thursday she takes 5 mg.   Yes Historical Provider, MD   acyclovir (ZOVIRAX) 400 MG tablet TAKE 5 TABLETS BY MOUTH ONCE A DAY FOR 5 DAYS 02/26/17   Margaretmary Dys, MD   FREESTYLE LITE strip USE TO TEST TWO TIMES A  DAY 02/26/17   Margaretmary Dys, MD   SYRINGE/NEEDLE, DISP, 1 ML (B-D SYRINGE/NEEDLE 1CC/25GX5/8) 25G X 5/8" 1 ML MISC Inject 1 Syringe into the skin every 30 days 08/01/16   Margaretmary Dys, MD   glucose blood VI test strips (FREESTYLE LITE) strip USE TO TEST TWO TIMES A DAY 07/30/16   Margaretmary Dys, MD        Allergies:  Phenergan [promethazine hcl]; Azithromycin; Gluten meal; Lactose; Orange syrup; Tomato; Topamax [topiramate]; Codeine; and Hydrocodone     Review of Systems: 12 point ROS negative in all areas as listed below except as in HOPI  Constitutional, EENT, Cardiovascular, pulmonary, GI, GU, Musculoskeletal, skin, neurological, hematological, endocrine,  Psychiatric    Physical Examination:    Vitals:    07/31/17 0728   BP: 116/80   Pulse: 81   Resp: 15   Temp: 97 ??F (36.1 ??C)   SpO2: 95%    Weight: 186 lb 3.2 oz (84.5 kg)         General Appearance:  Alert, cooperative, no distress, appears stated age   Head:  Normocephalic, without obvious abnormality, atraumatic   Eyes:  PERRL, conjunctiva/corneas clear       Nose: Nares normal, no drainage or sinus tenderness   Throat: Lips, mucosa, and tongue normal   Neck: Supple, symmetrical, trachea midline, no adenopathy, thyroid: not enlarged, symmetric, no tenderness/mass/nodules, no carotid bruit or JVD       Lungs:   Clear to auscultation bilaterally, respirations unlabored   Chest Wall:  No tenderness or deformity   Heart:  Regular rate and rhythm, S1, S2 normal, no murmur, rub or gallop   Abdomen:   Soft, non-tender, bowel sounds active all four quadrants,  no masses, no organomegaly           Extremities: Extremities normal, atraumatic, no cyanosis or edema   Pulses: 2+ and symmetric   Skin: Skin color, texture, turgor normal, no rashes or lesions   Pysch: Normal mood and affect   Neurologic: Normal gross motor and sensory exam.         Labs  CBC:   Lab Results   Component Value Date    WBC 6.2 07/30/2017    RBC 4.33 07/30/2017    HGB 13.5 07/30/2017    HCT 40.0 07/30/2017    MCV 92.2 07/30/2017    RDW 13.1 07/30/2017    PLT 261 07/30/2017     CMP:    Lab Results   Component Value Date    NA 136 07/30/2017    K 4.4 07/30/2017    CL 102 07/30/2017    CO2 26 07/30/2017    BUN 8 07/30/2017    CREATININE 0.7 07/30/2017    GFRAA >60 07/30/2017    GFRAA >60 12/28/2010    AGRATIO 1.4 07/30/2017    LABGLOM >60 07/30/2017    GLUCOSE 83 07/30/2017    PROT 7.1 07/30/2017    PROT 7.3 12/28/2010    CALCIUM 9.3 07/30/2017    BILITOT 0.3 07/30/2017    ALKPHOS 106 07/30/2017    AST 16 07/30/2017    ALT 13 07/30/2017     PT/INR:  No results found for: PTINR  Lab Results   Component Value Date    CKTOTAL 43 07/25/2014    CKMB <0.5  07/25/2014    TROPONINI <0.01 07/31/2017       EKG:07/30/17 I have reviewed EKG with the following interpretation:  Normal sinus rhythmCannot rule out Anterior infarct , age  undeterminedAbnormal ECGLow voltage QRSNo significant change was foundWhen compared with ECG of6.16.13Confirmed by Shalena Ezzell MD, Brendon Christoffel ([id number]) on 07/31/2017 5:44:32 AM     CXR 07/30/17  No acute bony abnormality.  The heart size and mediastinal contours are within normal limits, stable.  The lungs are clear.    MRI brain 07/24/17  IMPRESSION:  There are 4 or 5 punctate areas of high signal in the periventricular white  matter that are nonspecific and could be incidental. Demyelination cannot be  excluded in the appropriate clinical setting. Chronic small vessel ischemic  disease, migraines, or Lyme disease can cause a similar finding    MRI C-spine 05/24/17  Left-sided disc and osteophyte at C5-6 causing moderate narrowing of the left neural foramen.  There is minimal narrowing of the neural foramina bilaterally at C4-5.  No stenosis of the thecal sac in the cervical region.    [facility] GXT 08/01/14  EF 69% Normal, no evidence of ischemia, EKG within normal limits     Myoview stress test 06/01/12   No evidence of reversible perfusion defect to suggest myocardial ischemia.  LVEF 64%    Assessment  Angina pectoris new onset  Hyperlipidemia  Patient Active Problem List   Diagnosis   ??? Hypothyroidism   ??? Protein S deficiency ([place])   ??? Fibromyalgia   ??? Gastroesophageal reflux disease without esophagitis   ??? Controlled type 2 diabetes mellitus without complication, without long-term current use of insulin ([place])   ??? Migraine without status migrainosus, not intractable   ??? Chest pain         Plan:  Stress test with myoview  Risk factor modification.Start lipitor.  In view of multiple coronary risk factors she will benefit from statins.  Will hold coumadin for now  Thank you for allowing to [place] to participate in the care or [place]. Further evaluation will  be based upon the patient's clinical course and testing results.     Baruch Goldmann, MD

## 2017-07-31 NOTE — Progress Notes (Signed)
Perfect Serve to Dr.V regarding pt stress test results negative at this time, will monitor.

## 2017-07-31 NOTE — Progress Notes (Signed)
Pt up in bed at this time, pt alert and oriented x4. Pt states no chest pain at this time. AM meds passed see MAR. Pt states experiencing headache at this time, per chart nitro was given previously. Pt rates pain 5/10. Will request tylenol PRN. Pt shift assessment completed.NPO per cardiology consult. Will continue to monitor.

## 2017-07-31 NOTE — Consults (Signed)
Pharmacy to Dose Warfarin    Dx: previous PE  Goal INR range 2-3  Home Warfarin dose: 5mg  on Mon & Thursday, 7.5mg  all other days    Date  INR  Warfarin  07-30-17           2.08                   7.5mg  (normal home dose)    Recommend Warfarin 7.5 mg tonight x1.  Daily INR ordered.  Rx will continue to manage therapy per Dr. Reece Leader consult order.  [place].Ph.  07/31/2017 1:19 AM

## 2017-07-31 NOTE — Progress Notes (Signed)
Pt educated on cardiac stress testing. Lungs clear , heart sounds regular rhythm. Pt verbalizes understanding to cardiac stress testing. Questions and concerns addressed. Pt is agreeable to proceed with stress testing.

## 2017-07-31 NOTE — H&P (Addendum)
Combined History and Physical and d/c summary      PCP: Margaretmary Dys, MD    Date of Admission: 07/30/2017    Date of Service: Pt seen/examined on 07/31/2017    Chief Complaint:    Chief Complaint   Patient presents with   ??? Chest Pain     pt. states she has been having chest pain and pressure x3 days, states she thought it was heartburn but tonight she was having jaw pain and dental pain         History Of Present Illness:      The patient is a 46 y.o. female with Type II DM, Protein S Deficiency, Hx of PE, Hypothyroidism, GERD and mild, intermittent asthma who presented to [facility] ED with complaint of chest paiin. Pt states it started a couple of days ago and she thought it was intermittent chest pain with nausea and upset stomach. Pt states yesterday it felt like chest pressure and it then radiated up her neck, jaw and into her bilateral axilla. Pt state it was about a 7-8/10 at its worst. The intense pain lasted for a couple of minutes. The general discomfort lasted for about 15 minutes. Pt states she was driving when the pain came on. She states over the past couple of days she had been working and cleaning the garage. She has noticed some discomfort during that time but thought it was more indigestion. She states she has tried Zantac which provided no relief. She also tried her prescription for Zofran which did help with nausea. Pt has no hx of MI. She did have 2 stress tests many years ago and the stress test came back negative. This was in 2013 ( see below). She does have a hx of "having a fast heart rate" for which she takes propranolol. She states she did not have any palpitations. Associated symptoms: SOB, nausea. No vomiting. No diaphoresis.    Pt has Type II DM. No problems with HTN. No problems with HLD. Pt states her father has had a hx of MI - first was in his early 28s. He has had 3 total. She is a non-smoker. She does have a hx of PE & has Protein S deficiency.       Past Medical History:         Diagnosis Date   ??? Asthma    ??? Diabetes mellitus ([place])    ??? Fibromyalgia    ??? GERD (gastroesophageal reflux disease)    ??? Hypothyroidism    ??? IBS (irritable bowel syndrome)    ??? Migraine    ??? Pleurisy     multiple episodes "several yrs ago"   ??? Protein S deficiency ([place])    ??? Pulmonary embolism ([place])    ??? Type II or unspecified type diabetes mellitus without mention of complication, not stated as uncontrolled        Past Surgical History:        Procedure Laterality Date   ??? CHOLECYSTECTOMY     ??? ENDOMETRIAL ABLATION     ??? OTHER SURGICAL HISTORY  12/23/2012    laparoscopic cholecystectomy   ??? PELVIC LAPAROSCOPY         Medications Prior to Admission:    Prior to Admission medications    Medication Sig Start Date End Date Taking? Authorizing Provider   ondansetron (ZOFRAN) 4 MG tablet TAKE ONE TABLET BY MOUTH EVERY EIGHT HOURS AS NEEDED FOR NAUSEA 06/26/17  Yes Margaretmary Dys, MD   thyroid (  NATURE-THROID) 32.5 MG tablet Take 1 tablet by mouth daily 06/10/17  Yes Margaretmary Dys, MD   propranolol (INDERAL LA) 60 MG extended release capsule Take 1 capsule by mouth daily 01/30/17  Yes Margaretmary Dys, MD   cyanocobalamin 1000 MCG/ML injection Inject 1 mL into the muscle every 30 days 08/01/16  Yes Margaretmary Dys, MD   warfarin (COUMADIN) 5 MG tablet Take daily as directed 07/30/16  Yes Margaretmary Dys, MD   methocarbamol (ROBAXIN) 500 MG tablet TAKE ONE TABLET THREE TIMES A DAY 07/30/16  Yes Margaretmary Dys, MD   metFORMIN (GLUCOPHAGE) 500 MG tablet TAKE ONE TABLET BY MOUTH DAILY (WITH BREAKFAST) 07/30/16  Yes Margaretmary Dys, MD   folic acid-pyridoxine-cyancobalamin (FOLBIC) 2.5-25-2 MG TABS TAKE ONE TABLET BY MOUTH EVERY DAY 07/30/16  Yes Margaretmary Dys, MD   MAGNESIUM PO Take by mouth   Yes Historical Provider, MD   warfarin (COUMADIN) 1 MG tablet   Take 5 mg by mouth daily at 1800 Pt takes 7.5 mg every day except Monday and Thursday she takes 5 mg.   Yes Historical Provider, MD   acyclovir (ZOVIRAX) 400 MG tablet TAKE 5  TABLETS BY MOUTH ONCE A DAY FOR 5 DAYS 02/26/17   Margaretmary Dys, MD   FREESTYLE LITE strip USE TO TEST TWO TIMES A DAY 02/26/17   Margaretmary Dys, MD   SYRINGE/NEEDLE, DISP, 1 ML (B-D SYRINGE/NEEDLE 1CC/25GX5/8) 25G X 5/8" 1 ML MISC Inject 1 Syringe into the skin every 30 days 08/01/16   Margaretmary Dys, MD   glucose blood VI test strips (FREESTYLE LITE) strip USE TO TEST TWO TIMES A DAY 07/30/16   Margaretmary Dys, MD       Allergies:  Phenergan [promethazine hcl]; Azithromycin; Gluten meal; Lactose; Orange syrup; Tomato; Topamax [topiramate]; Codeine; and Hydrocodone    Social History:  The patient currently lives at home.    TOBACCO:   reports that she has never smoked. She has never used smokeless tobacco.  ETOH:   reports that she does not drink alcohol.      Family History:   Positive as follows:    History reviewed. No pertinent family history.    REVIEW OF SYSTEMS:     Constitutional: Negative for fever   HENT: Negative for sore throat   Eyes: Negative for redness   Respiratory: Negative  for dyspnea, cough   Cardiovascular: + chest pressure   Gastrointestinal: Negative for vomiting, diarrhea , + nausea  Genitourinary: Negative for hematuria   Musculoskeletal: Negative for arthralgias   Skin: Negative for rash   Neurological: Negative for syncope   Hematological: Negative for adenopathy   Psychiatric/Behavorial: Negative for anxiety    PHYSICAL EXAM:    BP 116/80    Pulse 81    Temp 97 ??F (36.1 ??C) (Oral)    Resp 15    Ht 5\' 2"  (1.575 m)    Wt 186 lb 3.2 oz (84.5 kg)    LMP 07/29/2017    SpO2 95%    BMI 34.06 kg/m??   Gen: No distress. Alert. Pleasant obese caucasian female  Eyes: PERRL. No sclera icterus. No conjunctival injection.   ENT: No discharge. Pharynx clear.   Neck: No JVD.  No Carotid Bruit. Trachea midline.  Resp: No accessory muscle use. No crackles. No wheezes. No rhonchi. On RA  CV: Regular rate. Regular rhythm. No murmur.  No rub. No edema.   Capillary Refill: Brisk,< 3 seconds   Peripheral Pulses:  +2 palpable, equal bilaterally  GI: Non-tender. Non-distended. No masses. No organomegaly. Normal bowel sounds. No hernia.   Skin: Warm and dry. No nodule on exposed extremities. No rash on exposed extremities.   M/S: No cyanosis. No joint deformity. No clubbing. No chest wall tenderness  Neuro: Awake. Grossly nonfocal    Psych: Oriented x 3. No anxiety or agitation.       I Otilio Carpen have reviewed the chart on Jasmine Strickland and personally interviewed and examined patient, reviewed the data (labs and imaging) and after discussion with my PA formulated the plan.  Agree with note with the following edits.    HPI: 46 year old female with a history of type 2 diabetes protein S deficiency and history of PE presently emergency room with chest pain.  She noticed it about a couple of days ago when she had some nausea.  Yesterday she felt chest pressure going up her neck and  to both axillae.  It lasted for about 2 or 3 minutes.  Resolved spontaneously.  No shortness of breath, cough or fever.    I reviewed the patient's Past Medical History, Past Surgical History, Medications, and Allergies.     Physical exam:    BP 116/80    Pulse 81    Temp 97 ??F (36.1 ??C) (Oral)    Resp 15    Ht 5\' 2"  (1.575 m)    Wt 186 lb 3.2 oz (84.5 kg)    LMP 07/29/2017    SpO2 95%    BMI 34.06 kg/m??     Gen: No distress. Alert.   Eyes: PERRL. No sclera icterus. No conjunctival injection.   ENT: No discharge. Pharynx clear.   Neck: Trachea midline. Normal thyroid.   Resp: No accessory muscle use. No crackles. No wheezes. No rhonchi. No dullness on percussion.  CV: Regular rate. Regular rhythm. No murmur or rub. No edema.   GI: Non-tender. Non-distended. No masses. No organomegaly. Normal bowel sounds. No hernia.   Skin: Warm and dry. No nodule on exposed extremities. No rash on exposed extremities.   Lymph: No cervical LAD. No supraclavicular LAD.   M/S: No cyanosis. No joint deformity. No clubbing.   Neuro: Awake.  Moves all 4  extremities, non focal  Psych: Oriented x 3. No anxiety or agitation.           C.Stuti Sandin,M.D.      CBC:   Recent Labs      07/30/17   2221   WBC  6.2   HGB  13.5   HCT  40.0   MCV  92.2   PLT  261     BMP:   Recent Labs      07/30/17   2221   NA  136   K  4.4   CL  102   CO2  26   BUN  8   CREATININE  0.7     LIVER PROFILE:   Recent Labs      07/30/17   2221   AST  16   ALT  13   BILITOT  0.3   ALKPHOS  106     PT/INR:   Recent Labs      07/30/17   2221  07/31/17   0423   PROTIME  23.7*  23.5*   INR  2.08*  2.06*     APTT:   Recent Labs      07/30/17   2221   APTT  39.7*     UA:  Recent  Labs      07/30/17   2330   COLORU  Yellow   PHUR  6.0   CLARITYU  Clear   SPECGRAV  1.010   LEUKOCYTESUR  Negative   UROBILINOGEN  0.2   BILIRUBINUR  Negative   BLOODU  Negative   GLUCOSEU  Negative        CARDIAC ENZYMES  Recent Labs      07/30/17   2221  07/31/17   0422   TROPONINI  <0.01  <0.01       U/A:    Lab Results   Component Value Date    COLORU Yellow 07/30/2017    WBCUA None seen 09/19/2013    RBCUA None seen 09/19/2013    CLARITYU Clear 07/30/2017    SPECGRAV 1.010 07/30/2017    LEUKOCYTESUR Negative 07/30/2017    BLOODU Negative 07/30/2017    GLUCOSEU Negative 07/30/2017    GLUCOSEU Neg 12/28/2010       ABG    Lab Results   Component Value Date    BEART -0.5 07/25/2014    PCO2ART 24.7 07/25/2014       CULTURES  N/A    EKG:  I have reviewed the EKG with the following interpretation:   07/31/2017  Normal sinus rhythm 66  Cannot rule out Anterior infarct , age undetermined  Abnormal ECG  Low voltage QRS  No significant change was found  When compared with ECG of6.16.13  Confirmed by GUPTA MD, [place] ([id number]) on 07/31/2017 5:44:32 AM     RADIOLOGY    XR CHEST PORTABLE 07/30/2017   Final Result   Stable portable study.         Stress/Rest NM Myocardial SPECT RX    (Results Pending)       Pertinent previous results reviewed     06/01/2012 Stress Test  Normal exercise EKG portion.  Perfusion imaging will be  dictated  separately and later.    ASSESSMENT/PLAN:    Chest Pain  - c/o central chest pressure with radiation to her jaw and axilla bilaterally  - CXR: no acute process  - EKG: No acute process, initial trop: neg  - Admit to PCU, telemetry  - serial trops, recent lipids: Chol 219, Trig 123, HDL 53, LDL 141  - ASA, Nitropaste, add Lipitor  - Cardiology consult - apprec recs  - Stress test    Hx of PE  - on Coumadin    Type II DM  - controlled w/o Metformin  - last Hgb A1c: 5.8 on 05/08/17  - Held Metformin for now  - monitor    Hx of Tachycardia  - takes propranolol  - BP controlled/borderline low  - HR controlled, hold Propranolol for now  - monitor    Hypothyroidism  - cont [place] thyroid    GERD  - add Protonix IV    DVT Prophylaxis: Coumadin  Diet: Diet NPO Time Specified  Code Status: Full Code     Joanell Rising PA-C 9:33 AM 07/31/2017      ECHO normal.  Stress tst negative    D/c home  F/u pcp 1 week  C.Cincere Zorn,M.D.

## 2017-07-31 NOTE — Progress Notes (Signed)
Called Stress lab spoke with Stanton Kidney RN states aware of order from [place] will aim for before noon for stress. Will continue to monitor.

## 2017-08-01 LAB — EKG 12-LEAD
Atrial Rate: 66 {beats}/min
P Axis: 13 degrees
P-R Interval: 160 ms
Q-T Interval: 380 ms
QRS Duration: 84 ms
QTc Calculation (Bazett): 398 ms
R Axis: -3 degrees
T Axis: 15 degrees
Ventricular Rate: 66 {beats}/min

## 2017-08-09 ENCOUNTER — Encounter

## 2017-08-11 MED ORDER — WARFARIN SODIUM 5 MG PO TABS
5 MG | ORAL_TABLET | ORAL | 0 refills | Status: DC
Start: 2017-08-11 — End: 2017-12-01

## 2017-08-11 MED ORDER — METHOCARBAMOL 500 MG PO TABS
500 MG | ORAL_TABLET | ORAL | 0 refills | Status: DC
Start: 2017-08-11 — End: 2017-12-01

## 2017-08-14 LAB — LIPID PANEL
Cholesterol, Total: 189 mg/dL (ref 0–200)
HDL: 48 mg/dL (ref 40–100)
LDL Direct: 121 mg/dL (ref 0–160)
Triglycerides: 101 mg/dL (ref 0–150)
VLDL: 20 mg/dL

## 2017-08-14 NOTE — Progress Notes (Signed)
Lipids improved, but not at goal. rec statin like lipitor 20mg  daily if she can tolerate it

## 2017-08-15 ENCOUNTER — Ambulatory Visit
Admit: 2017-08-15 | Discharge: 2017-08-15 | Payer: BLUE CROSS/BLUE SHIELD | Attending: Family Medicine | Primary: Family Medicine

## 2017-08-15 DIAGNOSIS — R079 Chest pain, unspecified: Secondary | ICD-10-CM

## 2017-08-15 LAB — HOMOCYSTEINE, PLASMA

## 2017-08-15 NOTE — Progress Notes (Signed)
Chief Complaint   Patient presents with   ??? Follow-Up from [facility]       HPI:  Jasmine Strickland is a 46 y.o. (DOB: February 09, 1971) here today   for   HPI  Hospital follow up from [facility] for chest pain that was radiating up into neck and jaw 07/30/2017-07/31/2017. Originally thought it was heartburn, pain started in chest like a tight band and radiated up into her jaws and teeth even hurt. Had similar episode a few years ago.     Is seeing Dr. Fortino Sic, doing multiple tests to rule out either mini strokes or MS. Numbness and tingling has worsened in legs and arms, unsteady gait, frequent falls for the last month. Has been having throat spasms, and choking some on her pills. She now seems to have numbness at least up to her knees and still some numbness in her hands.  She has had an echocardiogram and carotid Dopplers done recently.      Lipids still slightly elevated, pt would like to wait a few more months to see if improvement.     Patient's medications, allergies, past medical, surgical, social and family histories were reviewed and updated as appropriate.    ROS:  Review of Systems   Constitutional: Negative for chills and fatigue.   Respiratory: Negative for chest tightness, shortness of breath and wheezing.    Cardiovascular: Negative for chest pain and palpitations.   Gastrointestinal: Negative for blood in stool, constipation and diarrhea.   Neurological: Negative for dizziness, light-headedness and headaches.           Hemoglobin A1C (%)   Date Value   05/08/2017 5.8     Microscopic Examination (no units)   Date Value   07/30/2017 Not Indicated     LDL Calculated (mg/dL)   Date Value   05/08/2017 141 (H)       Past Medical History:   Diagnosis Date   ??? Asthma    ??? Diabetes mellitus ([place])    ??? Fibromyalgia    ??? GERD (gastroesophageal reflux disease)    ??? Hypothyroidism    ??? IBS (irritable bowel syndrome)    ??? Migraine    ??? Pleurisy     multiple episodes "several yrs ago"   ??? Protein S  deficiency ([place])    ??? Pulmonary embolism ([place])    ??? Type II or unspecified type diabetes mellitus without mention of complication, not stated as uncontrolled        No family history on file.    Social History     Social History   ??? Marital status: Married     Spouse name: N/A   ??? Number of children: N/A   ??? Years of education: N/A     Occupational History   ??? Not on file.     Social History Main Topics   ??? Smoking status: Never Smoker   ??? Smokeless tobacco: Never Used   ??? Alcohol use No   ??? Drug use: No   ??? Sexual activity: Yes     Partners: Male     Other Topics Concern   ??? Not on file     Social History Narrative   ??? No narrative on file       Prior to Visit Medications    Medication Sig Taking? Authorizing Provider   warfarin (COUMADIN) 5 MG tablet TAKE UP TO 2 TABLETS DAILY AS DIRECTED Yes Margaretmary Dys, MD  methocarbamol (ROBAXIN) 500 MG tablet TAKE ONE TABLET THREE TIMES A DAY Yes Margaretmary Dys, MD   ondansetron (ZOFRAN) 4 MG tablet TAKE ONE TABLET BY MOUTH EVERY EIGHT HOURS AS NEEDED FOR NAUSEA Yes Margaretmary Dys, MD   thyroid (NATURE-THROID) 32.5 MG tablet Take 1 tablet by mouth daily Yes Margaretmary Dys, MD   FREESTYLE LITE strip USE TO TEST TWO TIMES A DAY Yes Margaretmary Dys, MD   propranolol (INDERAL LA) 60 MG extended release capsule Take 1 capsule by mouth daily Yes Margaretmary Dys, MD   cyanocobalamin 1000 MCG/ML injection Inject 1 mL into the muscle every 30 days Yes Margaretmary Dys, MD   SYRINGE/NEEDLE, DISP, 1 ML (B-D SYRINGE/NEEDLE 1CC/25GX5/8) 25G X 5/8" 1 ML MISC Inject 1 Syringe into the skin every 30 days Yes Margaretmary Dys, MD   warfarin (COUMADIN) 5 MG tablet Take daily as directed Yes Margaretmary Dys, MD   metFORMIN (GLUCOPHAGE) 500 MG tablet TAKE ONE TABLET BY MOUTH DAILY (WITH BREAKFAST) Yes Margaretmary Dys, MD   glucose blood VI test strips (FREESTYLE LITE) strip USE TO TEST TWO TIMES A DAY Yes Margaretmary Dys, MD   folic acid-pyridoxine-cyancobalamin (FOLBIC) 2.5-25-2 MG TABS TAKE ONE  TABLET BY MOUTH EVERY DAY Yes Margaretmary Dys, MD   MAGNESIUM PO Take by mouth Yes Historical Provider, MD   warfarin (COUMADIN) 1 MG tablet   Take 5 mg by mouth daily at 1800 Pt takes 7.5 mg every day except Monday and Thursday she takes 5 mg. Yes Historical Provider, MD       Allergies   Allergen Reactions   ??? Phenergan [Promethazine Hcl] Anaphylaxis   ??? Azithromycin Swelling   ??? Gluten Meal    ??? Lactose Diarrhea   ??? Orange Syrup    ??? Tomato    ??? Topamax [Topiramate] Other (See Comments)     Chest pain   ??? Codeine Nausea And Vomiting   ??? Hydrocodone Nausea And Vomiting       OBJECTIVE:    BP 132/78    Pulse 69    Ht 5' 1.8" (1.57 m)    Wt 183 lb 9.6 oz (83.3 kg)    LMP 07/29/2017    SpO2 98%    BMI 33.80 kg/m??     BP Readings from Last 2 Encounters:   08/15/17 132/78   07/31/17 97/64       Wt Readings from Last 3 Encounters:   08/15/17 183 lb 9.6 oz (83.3 kg)   07/31/17 186 lb 3.2 oz (84.5 kg)   05/08/17 185 lb 3.2 oz (84 kg)       Physical Exam   Constitutional: She is oriented to person, place, and time. She appears well-developed and well-nourished.   HENT:   Head: Normocephalic and atraumatic.   Eyes: Conjunctivae and EOM are normal.   Neck: No tracheal deviation present.   Cardiovascular: Normal rate and regular rhythm.    Pulmonary/Chest: Effort normal and breath sounds normal.   Abdominal: Soft. There is no tenderness.   Musculoskeletal: She exhibits no edema.   Neurological: She is alert and oriented to person, place, and time.   Skin: Skin is warm and dry.   Psychiatric: She has a normal mood and affect.         ASSESSMENT/PLAN:    1. Chest pain, unspecified type  Recent evaluation in the hospital.  Stress test was negative.  Results reviewed.  Does not appear to be cardiac in nature, but she does have risk factors.  Continue current treatment and monitor for now.    2. Numbness  Worsening symptoms.  She is being evaluated by neurology for possible multiple sclerosis or other issues.  Workup is ongoing.   Recommend she follow up with neurology as scheduled.    3. Unsteady gait  See above    4. Type 2 diabetes mellitus without complication, without long-term current use of insulin (HCC)  Excellent control at last check.  We'll repeat labs when due.    5. Pure hypercholesterolemia  Cholesterol much better, but not at goal given her history of diabetes and other cardiovascular risk factors.  I recommended a statin, the patient wishes to continue to work on diet and exercise at this time.      F/u with Dr. Fortino Sic as planned.   Discussed waiting 3 more months on Lipids before adding medication.     Scribe attestation: I, Lauretta Grill RMA, am scribing for and in the presence of Margaretmary Dys, MD. Electronically signed by Lauretta Grill RMA on 08/15/2017 at 2:07 PM      Provider attestation: I, Margaretmary Dys, MD  personally performed the services described in this documentation, as scribed by the user listed above in my presence, and it is both accurate and complete. I agree with the ROS, and Past Histories independently gathered by the clinical support staff and the remaining scribed note accurately describes my personal service to the patient.    08/15/2017    2:08 PM        This document was prepared by a combination of typing and transcription through a voice recognition software.

## 2017-08-25 MED ORDER — CYANOCOBALAMIN 1000 MCG/ML IJ SOLN
1000 MCG/ML | INTRAMUSCULAR | 5 refills | Status: DC
Start: 2017-08-25 — End: 2017-12-04

## 2017-08-25 NOTE — Telephone Encounter (Signed)
Refill request from pharmacy for b12 medication.     Last visit - 08/15/17     Pending visit - none    Last refill -08/01/16 for 11 refills     Last lab work -  11/06/16    Medication Contract signed -na    Additional Comments none

## 2017-08-29 LAB — CK: Total CK: 55 U/L (ref 26–192)

## 2017-08-29 LAB — HIGH SENSITIVITY CRP: CRP, High Sensitivity: 4.7 mg/L — ABNORMAL HIGH (ref 0.00–3.00)

## 2017-08-29 LAB — SEDIMENTATION RATE, AUTOMATED: Sed Rate: 4 mm/hr (ref 0–20)

## 2017-08-29 LAB — TSH: Thyrotropin Releasing Hormone: 1.15 u[IU]/mL (ref 0.34–4.82)

## 2017-08-30 LAB — RHEUMATOID FACTOR

## 2017-09-01 LAB — ANA

## 2017-09-01 LAB — IMMUNO AND PROTEIN ELECTRO

## 2017-09-01 LAB — ANGIOTENSIN CONVERTING ENZYME

## 2017-09-03 ENCOUNTER — Encounter

## 2017-09-03 MED ORDER — PROPRANOLOL HCL ER 60 MG PO CP24
60 MG | ORAL_CAPSULE | ORAL | 5 refills | Status: DC
Start: 2017-09-03 — End: 2018-03-09

## 2017-09-03 NOTE — Telephone Encounter (Signed)
Last office visit 08/25/17, advised to follow-up 0, next appointment 0.

## 2017-10-21 NOTE — Telephone Encounter (Signed)
Pt states her narure-throid is on back order. The pharmacist said she could either do np thyroid or armor thyroid. Can you please change it. BL/P

## 2017-10-21 NOTE — Telephone Encounter (Signed)
Okay to change to in NP thyroid 30 mg daily

## 2017-10-22 MED ORDER — THYROID 30 MG PO TABS
30 MG | ORAL_TABLET | Freq: Every day | ORAL | 3 refills | Status: DC
Start: 2017-10-22 — End: 2018-03-09

## 2017-10-22 NOTE — Telephone Encounter (Signed)
Script sent to bl/p

## 2017-12-01 ENCOUNTER — Encounter: Admit: 2017-12-01 | Discharge: 2017-12-01 | Payer: BLUE CROSS/BLUE SHIELD | Primary: Family Medicine

## 2017-12-01 ENCOUNTER — Encounter

## 2017-12-01 DIAGNOSIS — D6859 Other primary thrombophilia: Secondary | ICD-10-CM

## 2017-12-01 LAB — POCT PT/INR: INR,(POC): 1.7

## 2017-12-01 MED ORDER — FREESTYLE LITE TEST VI STRP
ORAL_STRIP | 2 refills | Status: DC
Start: 2017-12-01 — End: 2018-03-12

## 2017-12-01 MED ORDER — WARFARIN SODIUM 5 MG PO TABS
5 MG | ORAL_TABLET | ORAL | 2 refills | Status: DC
Start: 2017-12-01 — End: 2018-03-12

## 2017-12-01 MED ORDER — METHOCARBAMOL 500 MG PO TABS
500 MG | ORAL_TABLET | ORAL | 2 refills | Status: DC
Start: 2017-12-01 — End: 2018-09-10

## 2017-12-01 MED ORDER — ONDANSETRON HCL 4 MG PO TABS
4 MG | ORAL_TABLET | ORAL | 2 refills | Status: DC
Start: 2017-12-01 — End: 2018-12-14

## 2017-12-01 NOTE — Telephone Encounter (Signed)
Last office visit 08/15/2017, advised to follow-up None, next appointment 12/04/2017.

## 2017-12-01 NOTE — Progress Notes (Signed)
INR near goal.  Continue current coumadin dose and repeat INR in 2 weeks

## 2017-12-04 ENCOUNTER — Ambulatory Visit
Admit: 2017-12-04 | Discharge: 2017-12-04 | Payer: BLUE CROSS/BLUE SHIELD | Attending: Family Medicine | Primary: Family Medicine

## 2017-12-04 DIAGNOSIS — E119 Type 2 diabetes mellitus without complications: Secondary | ICD-10-CM

## 2017-12-04 NOTE — Progress Notes (Signed)
Chief Complaint   Patient presents with   . Diabetes       HPI:  Jasmine Strickland is a 46 y.o. (DOB: 02-21-1971) here today for   Diabetes   She presents for her follow-up diabetic visit. She has type 2 diabetes mellitus. Pertinent negatives for hypoglycemia include no dizziness. Pertinent negatives for diabetes include no chest pain and no fatigue. Risk factors for coronary artery disease include diabetes mellitus. When asked about current treatments, none were reported.     Pt has stopped taking her diabetic medication as discussed at her last appointment.She has also liberalized her carbohydrate intake over the past few months.      Pt has been seen by Dr. Fortino Sic and has been seen at [facility] since her last visit here.  Neuro at [place] clinic Note reviewed.  They recommended some additional labs.  Some of which have been done in the past, but others have not.    Pt does have episodes of blurred vision at times.  Some discussion was made as to these may be related to ocular migraines    Patient's medications, allergies, past medical, surgical, social and family histories were reviewed and updated as appropriate.    ROS:  Review of Systems   Constitutional: Negative for activity change, appetite change, fatigue and unexpected weight change.   HENT: Negative for congestion, ear discharge, ear pain, hearing loss and sneezing.    Eyes: Positive for visual disturbance.   Respiratory: Negative for cough, chest tightness and shortness of breath.    Cardiovascular: Negative for chest pain and palpitations.   Gastrointestinal: Negative for abdominal pain, blood in stool, constipation and diarrhea.   Genitourinary: Negative for difficulty urinating and flank pain.   Skin: Negative for color change and rash.   Neurological: Negative for dizziness.   Psychiatric/Behavioral: Negative for sleep disturbance.           Hemoglobin A1C (%)   Date Value   05/08/2017 5.8     Microscopic Examination (no units)   Date Value    07/30/2017 Not Indicated     LDL Calculated (mg/dL)   Date Value   05/08/2017 141 (H)       Past Medical History:   Diagnosis Date   . Asthma    . Diabetes mellitus ([place])    . Fibromyalgia    . GERD (gastroesophageal reflux disease)    . Hypothyroidism    . IBS (irritable bowel syndrome)    . Migraine    . Pleurisy     multiple episodes "several yrs ago"   . Protein S deficiency ([place])    . Pulmonary embolism ([place])    . Type II or unspecified type diabetes mellitus without mention of complication, not stated as uncontrolled        No family history on file.    Social History     Social History   . Marital status: Married     Spouse name: N/A   . Number of children: N/A   . Years of education: N/A     Occupational History   . Not on file.     Social History Main Topics   . Smoking status: Never Smoker   . Smokeless tobacco: Never Used   . Alcohol use No   . Drug use: No   . Sexual activity: Yes     Partners: Male     Other Topics Concern   . Not on file  Social History Narrative   . No narrative on file       Prior to Visit Medications    Medication Sig Taking? Authorizing Provider   methocarbamol (ROBAXIN) 500 MG tablet TAKE ONE TABLET THREE TIMES A DAY Yes Margaretmary Dys, MD   warfarin (COUMADIN) 5 MG tablet TAKE UP TO 2 TABLETS DAILY AS DIRECTED Yes Margaretmary Dys, MD   ondansetron (ZOFRAN) 4 MG tablet TAKE ONE TABLET BY MOUTH EVERY EIGHT HOURS AS NEEDED FOR NAUSEA Yes Margaretmary Dys, MD   FREESTYLE LITE strip USE TO TEST TWO TIMES A DAY Yes Margaretmary Dys, MD   thyroid (NP THYROID) 30 MG tablet Take 1 tablet by mouth daily Yes Margaretmary Dys, MD   propranolol (INDERAL LA) 60 MG extended release capsule TAKE ONE CAPSULE BY MOUTH EVERY DAY Yes Margaretmary Dys, MD   warfarin (COUMADIN) 5 MG tablet Take daily as directed Yes Margaretmary Dys, MD   glucose blood VI test strips (FREESTYLE LITE) strip USE TO TEST TWO TIMES A DAY Yes Margaretmary Dys, MD   MAGNESIUM PO Take by mouth Yes Historical Provider, MD   warfarin  (COUMADIN) 1 MG tablet   Take 5 mg by mouth daily at 1800 Pt takes 7.5 mg every day except Monday and Thursday she takes 5 mg. Yes Historical Provider, MD   SYRINGE/NEEDLE, DISP, 1 ML (B-D SYRINGE/NEEDLE 1CC/25GX5/8) 25G X 5/8" 1 ML MISC Inject 1 Syringe into the skin every 30 days  Margaretmary Dys, MD       Allergies   Allergen Reactions   . Phenergan [Promethazine Hcl] Anaphylaxis   . Azithromycin Swelling   . Gluten Meal    . Lactose Diarrhea   . Orange Syrup    . Tomato    . Topamax [Topiramate] Other (See Comments)     Chest pain   . Codeine Nausea And Vomiting   . Hydrocodone Nausea And Vomiting       OBJECTIVE:    BP 130/84   Pulse 99   Ht 5' 1.8" (1.57 m)   Wt 191 lb (86.6 kg)   SpO2 98%   BMI 35.16 kg/m     BP Readings from Last 2 Encounters:   12/04/17 130/84   08/15/17 132/78       Wt Readings from Last 3 Encounters:   12/04/17 191 lb (86.6 kg)   08/15/17 183 lb 9.6 oz (83.3 kg)   07/31/17 186 lb 3.2 oz (84.5 kg)       Physical Exam   Constitutional: She is oriented to person, place, and time. She appears well-developed and well-nourished.   HENT:   Head: Normocephalic.   Eyes: EOM are normal.   Neck: No tracheal deviation present.   Cardiovascular: Normal rate and regular rhythm.    Pulmonary/Chest: Effort normal and breath sounds normal.   Abdominal: Soft. There is no tenderness.   Musculoskeletal: She exhibits no edema.   Neurological: She is alert and oriented to person, place, and time.   Skin: Skin is warm and dry.   Psychiatric: She has a normal mood and affect.         ASSESSMENT/PLAN:    1. Type 2 diabetes mellitus without complication, without long-term current use of insulin (HCC)  Repeat labs today  - Hemoglobin A1C; Future    2. Fibromyalgia  See below.  Labs added as recommended by the neurologist the [facility]  - Vitamin B12; Future  - METHYLMALONIC ACID, SERUM; Future  - ENA 1 - ANTI  SMITH & ANTI RNP; Future  - ENA 2 - ANTI SSA & ANTI SSB; Future  - ANTI-DNA ANTIBODY,  DOUBLE-STRANDED; Future  - ANTI-NEUTROPHILIC CYTOPLASMIC ANTIBODY; Future  - Lyme Disease AB Total W Rflx to IgG/ IgM; Future  - TISSUE TRANSGLUTAMINASE ANTOBODY IGA W/ REFLEX; Future    3. Hypothyroidism, unspecified type  Well-controlled last check.    I discussed with her her visit to the neurologist at [facility] clinic.  We discussed the labs that were recommended.  She will attempt to get them copies of her prior imaging studies.  We'll add on labs as above.  They are trying to rule out possible sclerosis as well.      Scribe attestation: I, [id number] CMA, am scribing for and in the presence of Margaretmary Dys, MD. Electronically signed by Alanson Aly CMA on 12/04/2017 at 7:26 PM      Provider attestation: I, Margaretmary Dys, MD  personally performed the services described in this documentation, as scribed by the user listed above in my presence, and it is both accurate and complete. I agree with the ROS, and Past Histories independently gathered by the clinical support staff and the remaining scribed note accurately describes my personal service to the patient.    12/04/2017    7:27 PM        This document was prepared by a combination of typing and transcription through a voice recognition software.

## 2017-12-05 LAB — HEMOGLOBIN A1C
Hemoglobin A1C: 5.2 % (ref 4.3–6.1)
eAG: 102.5 mg/dL — ABNORMAL HIGH (ref 74–99)

## 2017-12-05 LAB — VITAMIN B12: Vitamin B-12: 505 pg/mL (ref 193–986)

## 2017-12-08 NOTE — Progress Notes (Signed)
b12 normal.  Sugar control still excellent.  Others pending.

## 2017-12-11 NOTE — Progress Notes (Signed)
Methylmalonic acid, lyme titer, others appear normal

## 2018-03-09 ENCOUNTER — Encounter

## 2018-03-09 MED ORDER — PROPRANOLOL HCL ER 60 MG PO CP24
60 MG | ORAL_CAPSULE | ORAL | 5 refills | Status: DC
Start: 2018-03-09 — End: 2018-09-10

## 2018-03-09 MED ORDER — NP THYROID 30 MG PO TABS
30 MG | ORAL_TABLET | ORAL | 5 refills | Status: DC
Start: 2018-03-09 — End: 2018-09-10

## 2018-03-09 NOTE — Telephone Encounter (Signed)
Last office visit 12/04/2017, advised to follow-up 0, next appointment 0.

## 2018-03-12 ENCOUNTER — Ambulatory Visit
Admit: 2018-03-12 | Discharge: 2018-03-12 | Payer: BLUE CROSS/BLUE SHIELD | Attending: Family Medicine | Primary: Family Medicine

## 2018-03-12 DIAGNOSIS — D6859 Other primary thrombophilia: Secondary | ICD-10-CM

## 2018-03-12 LAB — POCT PT/INR: INR,(POC): 1.1

## 2018-03-12 MED ORDER — COLESTIPOL HCL 1 G PO TABS
1 g | ORAL_TABLET | Freq: Two times a day (BID) | ORAL | 3 refills | Status: DC
Start: 2018-03-12 — End: 2018-04-17

## 2018-03-12 NOTE — Other (Signed)
Go to 5mg  m,w,f.  7.5mg  all other days.  Rpt in 2 weeks.  Informed in the office

## 2018-03-12 NOTE — Progress Notes (Signed)
Chief Complaint   Patient presents with   . Diarrhea       HPI:  Jasmine Strickland is a 47 y.o. (DOB: 09/21/1971) here today   for   Diarrhea    This is a recurrent problem. The current episode started 1 to 4 weeks ago. The problem occurs 5 to 10 times per day. The problem has been gradually worsening. The stool consistency is described as watery. The patient states that diarrhea awakens her from sleep. Associated symptoms include abdominal pain, headaches and weight loss. Pertinent negatives include no chills, fever or vomiting. She has tried change of diet, anti-motility drug and bismuth subsalicylate for the symptoms. The treatment provided mild relief.   Has episodes of not being able to make it to the bathroom, has altered her life and activities d/t not knowing when it will start. Can not eat fast food or any restaurant. All Proteins taste bad just started recently. Has dealt w/ bowel issues for years.   Has been gluten free for some time.  Ongoing issues w/ diarrhea.  Worse recently.        Patient's medications, allergies, past medical, surgical, social and family histories were reviewed and updated as appropriate.    ROS:  Review of Systems   Constitutional: Positive for appetite change, fatigue and weight loss. Negative for chills and fever.   Respiratory: Negative for chest tightness and shortness of breath.    Cardiovascular: Negative for chest pain and palpitations.   Gastrointestinal: Positive for abdominal pain, diarrhea and nausea. Negative for blood in stool, constipation and vomiting.   Genitourinary: Positive for frequency.   Neurological: Positive for dizziness and headaches. Negative for light-headedness.           Prior to Visit Medications    Medication Sig Taking? Authorizing Provider   colestipol (COLESTID) 1 g tablet Take 1 tablet by mouth 2 times daily Yes Mirna Mires, MD   NP THYROID 30 MG tablet TAKE ONE TABLET BY MOUTH DAILY Yes Mirna Mires, MD   propranolol (INDERAL LA) 60 MG  extended release capsule TAKE ONE CAPSULE BY MOUTH EVERY DAY Yes Mirna Mires, MD   methocarbamol (ROBAXIN) 500 MG tablet TAKE ONE TABLET THREE TIMES A DAY Yes Mirna Mires, MD   ondansetron (ZOFRAN) 4 MG tablet TAKE ONE TABLET BY MOUTH EVERY EIGHT HOURS AS NEEDED FOR NAUSEA Yes Mirna Mires, MD   warfarin (COUMADIN) 5 MG tablet Take daily as directed Yes Mirna Mires, MD   warfarin (COUMADIN) 1 MG tablet   Take 5 mg by mouth daily at 1800 Pt takes 7.5 mg every day except Monday and Thursday she takes 5 mg. Yes Historical Provider, MD       Allergies   Allergen Reactions   . Phenergan [Promethazine Hcl] Anaphylaxis   . Azithromycin Swelling   . Gluten Meal    . Lactose Diarrhea   . Orange Syrup    . Tomato    . Topamax [Topiramate] Other (See Comments)     Chest pain   . Codeine Nausea And Vomiting   . Hydrocodone Nausea And Vomiting       OBJECTIVE:    BP 124/86   Pulse 79   Temp 99.2 F (37.3 C) (Tympanic)   Ht 5' 1.8" (1.57 m)   Wt 182 lb 6.4 oz (82.7 kg)   SpO2 99%   BMI 33.58 kg/m     BP Readings from Last 2 Encounters:   03/12/18 124/86   12/04/17 130/84  Wt Readings from Last 3 Encounters:   03/12/18 182 lb 6.4 oz (82.7 kg)   12/04/17 191 lb (86.6 kg)   08/15/17 183 lb 9.6 oz (83.3 kg)        Physical Exam   Constitutional: She is oriented to person, place, and time. She appears well-developed and well-nourished.   HENT:   Head: Normocephalic.   Eyes: EOM are normal.   Neck: No tracheal deviation present.   Cardiovascular: Normal rate and regular rhythm.   Pulmonary/Chest: Effort normal and breath sounds normal.   Abdominal: Soft. There is no tenderness.   Musculoskeletal: She exhibits no edema.   Neurological: She is alert and oriented to person, place, and time.   Skin: Skin is warm and dry.   Psychiatric: She has a normal mood and affect.         ASSESSMENT/PLAN:    1. Protein S deficiency (HCC)  inr below goal.  See note on INR. Dose adjusted.    - POCT INR    2. Diarrhea, unspecified  type  Trial of med as below.  Has had labs in the past.  Has eliminated gluten and minimizing lactose.  Refer to GI given ongoing issues.  ? ibs w/ diarrhea.  Cannot take viberzi due to hx of cholecystectomy.  Consider xifaxin trial.  Await GI recs  - colestipol (COLESTID) 1 g tablet; Take 1 tablet by mouth 2 times daily  Dispense: 60 tablet; Refill: 3  -  - Herold Harms, MD, Gastroenterology, [place]          Scribe attestation:I, Floydene Flock RMA, am scribing for and in the presence of Mirna Mires, MD. Electronically signed by Floydene Flock RMA, on 03/12/2018 at 2:20 PM      Provider attestation: I, Mirna Mires, MD, personally performed the services scribed by the user listed above in my presence, and it is both accurate and complete. I agree with the ROS and Past Histories independently gathered by the clinical support staff and the remaining scribed note accurately describes my personal service to the patient.    @SIGNATURE @    03/12/2018  2:21 PM

## 2018-03-20 ENCOUNTER — Encounter: Attending: Gastroenterology | Primary: Family Medicine

## 2018-03-27 ENCOUNTER — Encounter: Attending: Gastroenterology | Primary: Family Medicine

## 2018-03-31 ENCOUNTER — Institutional Professional Consult (permissible substitution)
Admit: 2018-03-31 | Discharge: 2018-03-31 | Payer: BLUE CROSS/BLUE SHIELD | Attending: Gastroenterology | Primary: Family Medicine

## 2018-03-31 ENCOUNTER — Encounter: Admit: 2018-03-31 | Discharge: 2018-03-31 | Payer: BLUE CROSS/BLUE SHIELD | Primary: Family Medicine

## 2018-03-31 DIAGNOSIS — R197 Diarrhea, unspecified: Secondary | ICD-10-CM

## 2018-03-31 DIAGNOSIS — D6859 Other primary thrombophilia: Secondary | ICD-10-CM

## 2018-03-31 LAB — POCT PT/INR: INR,(POC): 1.4

## 2018-03-31 MED ORDER — PEG 3350 17 GM/SCOOP PO POWD
17 GM/SCOOP | Freq: Once | ORAL | 0 refills | Status: AC
Start: 2018-03-31 — End: 2018-03-31

## 2018-03-31 NOTE — Telephone Encounter (Signed)
Patient was seen by Dr Georgeann Oppenheim today and he ordered a colonoscopy for 04/14/18. He is requesting that she be put on a Lovenox bridge before the colonoscopy, is this ok? Also be off the Lovenox 24 hours before the procedure.

## 2018-03-31 NOTE — Telephone Encounter (Signed)
Stop coumadin 5 d prior to procedure and begin lovenox 1mg /kg bid.  Stop lovenox 24h prior to cscope.  Resume lovenox and coumadin evening after cscope.  Rpt inr 3 days after resuming.

## 2018-03-31 NOTE — Progress Notes (Signed)
[facility]    [address]   [place] [address]   Phone: [phone]    [phone]    CHIEF COMPLAINT     Chief Complaint   Patient presents with   ??? Establish Care     NP- ibs diarrhea         HPI     47 YO WF referred for diarrhea. Patient on coumadin for protein S deficiency. Patient has a long standing >10 year history of GI issues. In the past it has been alternating constipation and diarrhea but patient feels her symptoms have worsened over the last 1 year and mostly diarrhea now, diarrhea occurs intermittently (not every day) and she may still have some constipation maybe one day each week when she does not have a BM at all, this generally happens after periods of diarrhea. Diarrhea occurs on and off in flares and when she has a flare she can go upto 6-8 times with loose stools with a sense of urgency and has to run to the toilet to avoid an accident. No severe abdominal pain. Has bloating and abdominal cramping which is worse with her diarrhea. She denies rectal bleeding or melena. She denies a family history of colon cancer.  Over the years has tried numerous things/meds including probiotics, diet changes, eating gluten free (now been 6 years she has been eating gluten free and this helps a little but does not change her current symptoms). Recently PCP gave her Colestid but she has not started this yet. She says she thinks she has tried it before and it did not help. No history of pancreatitis. No significant alcohol use.  Extensive prior GI workup with [facility] in 2014 including EGD (normal) with negative duodenal biopsies. Colonoscopy with random colon biopsies negative for microscopic colitis. One 7 mm polyp removed by cold snare at 25 cms, path showed tubular adenoma. EGD/colonoscopy report reviewed in [place] everywhere.        PAST MEDICAL HISTORY     Past Medical History:   Diagnosis Date   ??? Asthma    ??? Diabetes mellitus ([place])    ??? Fibromyalgia    ??? GERD  (gastroesophageal reflux disease)    ??? Hypothyroidism    ??? IBS (irritable bowel syndrome)    ??? Migraine    ??? Pleurisy     multiple episodes "several yrs ago"   ??? Protein S deficiency ([place])    ??? Pulmonary embolism ([place])    ??? Type II or unspecified type diabetes mellitus without mention of complication, not stated as uncontrolled      FAMILY HISTORY   No family history on file.  SOCIAL HISTORY     Social History     Socioeconomic History   ??? Marital status: Married     Spouse name: Not on file   ??? Number of children: Not on file   ??? Years of education: Not on file   ??? Highest education level: Not on file   Occupational History   ??? Not on file   Social Needs   ??? Financial resource strain: Not on file   ??? Food insecurity:     Worry: Not on file     Inability: Not on file   ??? Transportation needs:     Medical: Not on file     Non-medical: Not on file   Tobacco Use   ??? Smoking status: Never Smoker   ??? Smokeless tobacco: Never  Used   Substance and Sexual Activity   ??? Alcohol use: No   ??? Drug use: No   ??? Sexual activity: Yes     Partners: Male   Lifestyle   ??? Physical activity:     Days per week: Not on file     Minutes per session: Not on file   ??? Stress: Not on file   Relationships   ??? Social connections:     Talks on phone: Not on file     Gets together: Not on file     Attends religious service: Not on file     Active member of club or organization: Not on file     Attends meetings of clubs or organizations: Not on file     Relationship status: Not on file   ??? Intimate partner violence:     Fear of current or ex partner: Not on file     Emotionally abused: Not on file     Physically abused: Not on file     Forced sexual activity: Not on file   Other Topics Concern   ??? Not on file   Social History Narrative   ??? Not on file     SURGICAL HISTORY     Past Surgical History:   Procedure Laterality Date   ??? CHOLECYSTECTOMY     ??? ENDOMETRIAL ABLATION     ??? OTHER SURGICAL HISTORY  12/23/2012    laparoscopic cholecystectomy   ???  PELVIC LAPAROSCOPY       CURRENT MEDICATIONS   (This list may include medications prescribed during this encounter as epic can not insert only the list prior to this encounter.)  Current Outpatient Rx   Medication Sig Dispense Refill   ??? colestipol (COLESTID) 1 g tablet Take 1 tablet by mouth 2 times daily 60 tablet 3   ??? NP THYROID 30 MG tablet TAKE ONE TABLET BY MOUTH DAILY 30 tablet 5   ??? propranolol (INDERAL LA) 60 MG extended release capsule TAKE ONE CAPSULE BY MOUTH EVERY DAY 30 capsule 5   ??? methocarbamol (ROBAXIN) 500 MG tablet TAKE ONE TABLET THREE TIMES A DAY 60 tablet 2   ??? ondansetron (ZOFRAN) 4 MG tablet TAKE ONE TABLET BY MOUTH EVERY EIGHT HOURS AS NEEDED FOR NAUSEA 60 tablet 2   ??? warfarin (COUMADIN) 5 MG tablet Take daily as directed 60 tablet 5   ??? warfarin (COUMADIN) 1 MG tablet   Take 5 mg by mouth daily at 1800 Pt takes 7.5 mg every day except Monday and Thursday she takes 5 mg.        ALLERGIES     Allergies   Allergen Reactions   ??? Phenergan [Promethazine Hcl] Anaphylaxis   ??? Azithromycin Swelling   ??? Gluten Meal    ??? Lactose Diarrhea   ??? Orange Syrup    ??? Tomato    ??? Topamax [Topiramate] Other (See Comments)     Chest pain   ??? Codeine Nausea And Vomiting   ??? Hydrocodone Nausea And Vomiting     IMMUNIZATIONS     There is no immunization history on file for this patient.  REVIEW OF SYSTEMS     Constitutional: denies fever and unexpected weight change.   HENT: Negative for ear pain, hearing loss and nosebleeds.    Eyes: Negative for pain and visual disturbance.   Respiratory: Negative for cough, shortness of breath and wheezing.    Cardiovascular: Negative for chest pain, palpitations and leg swelling.  Gastrointestinal: see HPI for details.  Endocrine: Negative for polydipsia, polyphagia and polyuria.   Genitourinary: Negative for difficulty urinating, dysuria, hematuria and urgency.   Musculoskeletal: Positive for arthralgias and back pain.   Skin: Negative for pallor and rash.    Allergic/Immunologic: Negative for environmental allergies and immunocompromised state.   Neurological: Negative for seizures, syncope.   Hematological: Negative for adenopathy. Does not bruise/bleed easily.   Psychiatric/Behavioral: Negative for agitation, confusion, hallucinations.    PHYSICAL EXAM   Ht 5' 1.8" (1.57 m)    Wt 181 lb (82.1 kg)    BMI 33.32 kg/m??   Wt Readings from Last 3 Encounters:   03/31/18 181 lb (82.1 kg)   03/12/18 182 lb 6.4 oz (82.7 kg)   12/04/17 191 lb (86.6 kg)     Constitutional: AAO3, No acute distress  HEENT: no pallor or icterus.   Neck: supple, no adenopathy  Cardiovascular: Normal heart rate, Normal rhythm, No murmurs,.   RS: Normal breath sounds, No wheezing,   Abdomen: soft, non tender, obese, BS+. No hepatosplenomegaly.  Extremities:  No edema.  Neuro: AAO3, non focal.      FINAL IMPRESSION       Longstanding diarrhea and also intermittently has mild constipation. Likely functional/irritable bowel syndrome. She feels her symptoms are worse over the last 1 year or so. She had a tubular adenoma removed at colonoscopy in 2014 and hence is due for a 5 year followup colonoscopy for surveillance. Will plan a colonoscopy with MAC. Random colon biopsies can be done at colonoscopy again to rule out microscopic colitis.  She is on coumadin for protein S deficiency and has a history of PE in the past, will check if she needs bridging with Lovenox, if this is done last dose of Lovenox should be at least 24 hours before the procedure. This was discussed with her.  Check stool studies  Trial of Metamucil or Citrucel fiber supplementation daily.  Prior duodenal biopsies have been negative  Has tried probiotics without benefit.  Risk of drug interactions with warfarin for Rifaximin and colestid.  Cannot use Viberzi post cholecystectomy

## 2018-04-01 MED ORDER — ENOXAPARIN SODIUM 100 MG/ML SC SOLN
100 | Freq: Two times a day (BID) | SUBCUTANEOUS | 0 refills | Status: AC
Start: 2018-04-01 — End: 2018-04-11

## 2018-04-01 NOTE — Telephone Encounter (Signed)
Pt informed. Rx sent to Dr. Megan Salon to sign.

## 2018-04-07 ENCOUNTER — Inpatient Hospital Stay: Payer: BLUE CROSS/BLUE SHIELD | Primary: Family Medicine

## 2018-04-07 DIAGNOSIS — R197 Diarrhea, unspecified: Secondary | ICD-10-CM

## 2018-04-07 LAB — C DIFF TOXIN/ANTIGEN: C difficile Toxin, EIA: NEGATIVE

## 2018-04-08 LAB — O&P SCREEN(GIARDIA/CRYPTOSPORIDIUM) #1
Cryptosporidium Ag: NEGATIVE
Giardia Ag, Stl: NEGATIVE

## 2018-04-10 LAB — PANCREATIC ELASTASE, FECAL: Pancreatic Elastase, Fecal: >500 ug/g (ref >=201)

## 2018-04-16 NOTE — Telephone Encounter (Signed)
Spoke with patient and confirmed the procedure on  05/03

## 2018-04-17 ENCOUNTER — Inpatient Hospital Stay: Payer: BLUE CROSS/BLUE SHIELD

## 2018-04-17 LAB — POC PREGNANCY UR-QUAL: Pregnancy, Urine: NEGATIVE

## 2018-04-17 MED ORDER — NORMAL SALINE FLUSH 0.9 % IV SOLN
0.9 % | INTRAVENOUS | Status: DC | PRN
Start: 2018-04-17 — End: 2018-04-17

## 2018-04-17 MED ORDER — LIDOCAINE HCL (PF) 2 % IJ SOLN
2 % | INTRAMUSCULAR | Status: DC | PRN
Start: 2018-04-17 — End: 2018-04-17
  Administered 2018-04-17: 16:00:00 60 via INTRAVENOUS

## 2018-04-17 MED ORDER — LIDOCAINE HCL 1 % IJ SOLN
1 % | Freq: Once | INTRAMUSCULAR | Status: DC | PRN
Start: 2018-04-17 — End: 2018-04-17

## 2018-04-17 MED ORDER — LACTATED RINGERS IV SOLN
INTRAVENOUS | Status: DC
Start: 2018-04-17 — End: 2018-04-17
  Administered 2018-04-17 (×2): via INTRAVENOUS

## 2018-04-17 MED ORDER — NORMAL SALINE FLUSH 0.9 % IV SOLN
0.9 % | Freq: Two times a day (BID) | INTRAVENOUS | Status: DC
Start: 2018-04-17 — End: 2018-04-17

## 2018-04-17 MED ORDER — PROPOFOL 200 MG/20ML IV EMUL
200 MG/20ML | INTRAVENOUS | Status: DC | PRN
Start: 2018-04-17 — End: 2018-04-17
  Administered 2018-04-17: 16:00:00 200 via INTRAVENOUS

## 2018-04-17 NOTE — Discharge Instructions (Signed)
PATIENT INSTRUCTIONS  POST-SEDATION    Jasmine Strickland          IMMEDIATELY FOLLOWING PROCEDURE:    Do not drive or operate machinery for the first twenty four hours after surgery.     Do not make any important decisions for twenty four hours after surgery or while taking narcotic pain medications or sedatives.     You should NOT BE LEFT UNATTENDED OR ALONE. A responsible adult should be with you for the rest of the day of your procedure and also during the night for your protection and safety.    You may experience some light headedness. Rest at home with activity as tolerated. You may not need to go to bed, but it is important to rest for the next 24 hours. You should not engage in athletic sports such as basketball, volleyball, jogging, skating, or activities requiring refined motor skills for 24 hours.   If you develop intractable nausea and vomiting or a severe headache please notify your doctor immediately.   You are not expected to have any fever, but if you feel warm, take your temperature. If you have a fever 101 degrees or higher, call your doctor.     If you have had an Endoscopy:   *Eat lightly for your first meal and gradually resume your normal / prescribed diet. DO NOT eat or drink until your gag reflex returns.   *If you have a sore throat you may use lozenges, or salt water gargles.   *If you have had a colonoscopy, do not expect a normal bowel movement for approximately three days due to the cleansing of the large intestine prior to colonoscopy.    ONCE YOU ARE HOME, IF YOU SHOULD HAVE:  Difficulty in breathing, persistent nausea or vomiting, bleeding you feel is excessive, or pain that is unusual, increased abdominal bloating, or any swelling, fever / chills, call your physician. If you cannot contact your physician, but feel that your signs and symptoms need a physician's attention, go to the Emergency Department.      FOLLOW-UP:    Please follow up with Dr. Megan Salon as scheduled or  needed.    Dr. Gilmore Laroche will call you with the biopsy findings.    Call Dr. Gilmore Laroche if there are any GI concerns.  [phone]    HOLD ANTICOAGULATION FOR TODAY AND TOMORROW. RESTART IT ON SUNDAY.    Repeat Colonoscopy in 10 years.    You may be receiving a follow up phone call to ask about your care.         Colon Polyps: Care Instructions  Your Care Instructions    Colon polyps are growths in the colon or the rectum. The cause of most colon polyps is not known, and most people who get them do not have any problems. But a certain kind can turn into cancer. For this reason, regular testing for colon polyps is important for people age 31 and older and anyone who has an increased risk for colon cancer.  Polyps are usually found through routine colon cancer screening tests. Although most colon polyps are not cancerous, they are usually removed and then tested for cancer. Screening for colon cancer saves lives because the cancer can usually be cured if it is caught early.  If you have a polyp that is the type that can turn into cancer, you may need more tests to examine your entire colon. The doctor will remove any other polyps that he or she finds,  and you will be tested more often.  Follow-up care is a key part of your treatment and safety. Be sure to make and go to all appointments, and call your doctor if you are having problems. It's also a good idea to know your test results and keep a list of the medicines you take.  How can you care for yourself at home?  Regular exams to look for colon polyps are the best way to prevent polyps from turning into colon cancer. These can include stool tests, sigmoidoscopy, colonoscopy, and CT colonography. Talk with your doctor about a testing schedule that is right for you.  To prevent polyps  There is no home treatment that can prevent colon polyps. But these steps may help lower your risk for cancer.   Stay active. Being active can help you get to and stay at a healthy  weight. Try to exercise on most days of the week. Walking is a good choice.   Eat well. Choose a variety of vegetables, fruits, legumes (such as peas and beans), fish, poultry, and whole grains.   Do not smoke. If you need help quitting, talk to your doctor about stop-smoking programs and medicines. These can increase your chances of quitting for good.   If you drink alcohol, limit how much you drink. Limit alcohol to 2 drinks a day for men and 1 drink a day for women.  When should you call for help?  Call your doctor now or seek immediate medical care if:    You have severe belly pain.     Your stools are maroon or very bloody.   Watch closely for changes in your health, and be sure to contact your doctor if:    You have a fever.     You have nausea or vomiting.     You have a change in bowel habits (new constipation or diarrhea).     Your symptoms get worse or are not improving as expected.   Where can you learn more?  Go to [email or web address] and sign in to your MyChart account. Enter C571 in the [place] box to learn more about "Colon Polyps: Care Instructions."     If you do not have an account, please click on the "Sign Up Now" link.  Current as of: March 11, 2017  Content Version: 11.9   2006-2018 [employer]. Care instructions adapted under license by [facility]. If you have questions about a medical condition or this instruction, always ask your healthcare professional. [place] any warranty or liability for your use of this information.         Diverticulosis: Care Instructions  Your Care Instructions  In diverticulosis, pouches called diverticula form in the wall of the large intestine (colon). The pouches do not cause any pain or other symptoms. Most people who have diverticulosis do not know they have it. But the pouches sometimes bleed, and if they become infected, they can cause pain and other symptoms.  When this happens, it is called diverticulitis.  Diverticula form when pressure pushes the wall of the colon outward at certain weak points. A diet that is too low in fiber can cause diverticula.  Follow-up care is a key part of your treatment and safety. Be sure to make and go to all appointments, and call your doctor if you are having problems. It's also a good idea to know your test results and keep a list of the medicines you take.  How  can you care for yourself at home?   Include fruits, leafy green vegetables, beans, and whole grains in your diet each day. These foods are high in fiber.   Take a fiber supplement, such as Citrucel or Metamucil, every day if needed. Read and follow all instructions on the label.   Drink plenty of fluids, enough so that your urine is light yellow or clear like water. If you have kidney, heart, or liver disease and have to limit fluids, talk with your doctor before you increase the amount of fluids you drink.   Get at least 30 minutes of exercise on most days of the week. Walking is a good choice. You also may want to do other activities, such as running, swimming, cycling, or playing tennis or team sports.   Cut out foods that cause gas, pain, or other symptoms.  When should you call for help?  Call your doctor now or seek immediate medical care if:    You have belly pain.     You pass maroon or very bloody stools.     You have a fever.     You have nausea and vomiting.     You have unusual changes in your bowel movements or abdominal swelling.     You have burning pain when you urinate.     You have abnormal vaginal discharge.     You have shoulder pain.     You have cramping pain that does not get better when you have a bowel movement or pass gas.     You pass gas or stool from your urethra while urinating.   Watch closely for changes in your health, and be sure to contact your doctor if you have any problems.  Where can you learn more?  Go to  [email or web address] and sign in to your MyChart account. Enter [phone] in the [place] box to learn more about "Diverticulosis: Care Instructions."     If you do not have an account, please click on the "Sign Up Now" link.  Current as of: March 11, 2017  Content Version: 11.9   2006-2018 Health[employer], Inc[employer]. Care instructions adapted under license by [facility]. If you have questions about a medical condition or this instruction, always ask your healthcare professional. [place] any warranty or liability for your use of this information.

## 2018-04-17 NOTE — Anesthesia Pre-Procedure Evaluation (Signed)
Department of Anesthesiology  Preprocedure Note       Name:  Jasmine Strickland   Age:  47 y.o.  DOB:  31-Dec-1970                                          MRN:  [id number]         Date:  04/17/2018      Surgeon: Juliann Mule):  Cyndi Bender, MD    Procedure: COLON Morton Amy. (11:30) (N/A )    Medications prior to admission:   Prior to Admission medications    Medication Sig Start Date End Date Taking? Authorizing Provider   enoxaparin (LOVENOX) 80 MG/0.8ML injection Inject into the skin 2 times daily   Yes Historical Provider, MD   NP THYROID 30 MG tablet TAKE ONE TABLET BY MOUTH DAILY 03/09/18  Yes Margaretmary Dys, MD   propranolol (INDERAL LA) 60 MG extended release capsule TAKE ONE CAPSULE BY MOUTH EVERY DAY 03/09/18  Yes Margaretmary Dys, MD   methocarbamol (ROBAXIN) 500 MG tablet TAKE ONE TABLET THREE TIMES A DAY 12/01/17  Yes Margaretmary Dys, MD   ondansetron (ZOFRAN) 4 MG tablet TAKE ONE TABLET BY MOUTH EVERY EIGHT HOURS AS NEEDED FOR NAUSEA 12/01/17  Yes Margaretmary Dys, MD   warfarin (COUMADIN) 5 MG tablet Take daily as directed 07/30/16  Yes Margaretmary Dys, MD   warfarin (COUMADIN) 1 MG tablet   Take 5 mg by mouth daily at 1800 Pt takes 7.5 mg every day except Monday and Thursday she takes 5 mg.   Yes Historical Provider, MD       Current medications:    Current Facility-Administered Medications   Medication Dose Route Frequency Provider Last Rate Last Dose   ??? lactated ringers infusion   Intravenous Continuous Lanelle Bal, MD       ??? sodium chloride flush 0.9 % injection 10 mL  10 mL Intravenous 2 times per day Lanelle Bal, MD       ??? sodium chloride flush 0.9 % injection 10 mL  10 mL Intravenous PRN Lanelle Bal, MD       ??? lidocaine 1 % injection 0.3 mL  0.3 mL Intradermal Once PRN Lanelle Bal, MD           Allergies:    Allergies   Allergen Reactions   ??? Phenergan [Promethazine Hcl] Anaphylaxis   ??? Azithromycin Swelling   ??? Gluten Meal    ??? Lactose Diarrhea   ??? Orange Syrup    ??? Tomato    ???  Topamax [Topiramate] Other (See Comments)     Chest pain   ??? Codeine Nausea And Vomiting   ??? Hydrocodone Nausea And Vomiting       Problem List:    Patient Active Problem List   Diagnosis Code   ??? Hypothyroidism E03.9   ??? Protein S deficiency ([place]) D68.59   ??? Fibromyalgia M79.7   ??? Gastroesophageal reflux disease without esophagitis K21.9   ??? Type 2 diabetes mellitus without complication, without long-term current use of insulin (HCC) E11.9   ??? Migraine without status migrainosus, not intractable G43.909   ??? Chest pain R07.9   ??? Angina pectoris, unstable (HCC) I20.0   ??? Pure hypercholesterolemia E78.00   ??? Diarrhea R19.7       Past Medical History:        Diagnosis Date   ???  Asthma    ??? Diabetes mellitus ([place])    ??? Fibromyalgia    ??? GERD (gastroesophageal reflux disease)    ??? Hypothyroidism    ??? IBS (irritable bowel syndrome)    ??? Migraine    ??? Pleurisy     multiple episodes "several yrs ago"   ??? Protein S deficiency ([place])    ??? Pulmonary embolism ([place])    ??? Type II or unspecified type diabetes mellitus without mention of complication, not stated as uncontrolled        Past Surgical History:        Procedure Laterality Date   ??? CHOLECYSTECTOMY     ??? ENDOMETRIAL ABLATION     ??? OTHER SURGICAL HISTORY  12/23/2012    laparoscopic cholecystectomy   ??? PELVIC LAPAROSCOPY         Social History:    Social History     Tobacco Use   ??? Smoking status: Never Smoker   ??? Smokeless tobacco: Never Used   Substance Use Topics   ??? Alcohol use: No                                Counseling given: Not Answered      Vital Signs (Current):   Vitals:    04/17/18 1056   BP: 113/68   Pulse: 73   Resp: 16   Temp: 97.9 ??F (36.6 ??C)   TempSrc: Temporal   SpO2: 98%   Weight: 181 lb (82.1 kg)   Height: _0  (1.575 m)                                              BP Readings from Last 3 Encounters:   04/17/18 113/68   03/31/18 128/70   03/12/18 124/86       NPO Status: Time of last liquid consumption: 0600                        Time of last solid  consumption: 2200                        Date of last liquid consumption: 04/17/18                        Date of last solid food consumption: 04/15/18    BMI:   Wt Readings from Last 3 Encounters:   04/17/18 181 lb (82.1 kg)   03/31/18 181 lb (82.1 kg)   03/12/18 182 lb 6.4 oz (82.7 kg)     Body mass index is 33.11 kg/m??.    CBC:   Lab Results   Component Value Date    WBC 6.2 07/30/2017    RBC 4.33 07/30/2017    HGB 13.5 07/30/2017    HCT 40.0 07/30/2017    MCV 92.2 07/30/2017    RDW 13.1 07/30/2017    PLT 261 07/30/2017       CMP:   Lab Results   Component Value Date    NA 136 07/30/2017    K 4.4 07/30/2017    CL 102 07/30/2017    CO2 26 07/30/2017    BUN 8 07/30/2017    CREATININE 0.7 07/30/2017    GFRAA >60 07/30/2017  GFRAA >60 12/28/2010    AGRATIO 1.4 07/30/2017    LABGLOM >60 07/30/2017    GLUCOSE 83 07/30/2017    PROT 7.1 07/30/2017    PROT 7.3 12/28/2010    CALCIUM 9.3 07/30/2017    BILITOT 0.3 07/30/2017    ALKPHOS 106 07/30/2017    AST 16 07/30/2017    ALT 13 07/30/2017       POC Tests: No results for input(s): POCGLU, POCNA, POCK, POCCL, POCBUN, POCHEMO, POCHCT in the last 72 hours.    Coags:   Lab Results   Component Value Date    PROTIME 23.5 07/31/2017    INR 1.4 03/31/2018    INR 2.06 07/31/2017    APTT 39.7 07/30/2017       HCG (If Applicable):   Lab Results   Component Value Date    PREGTESTUR Negative 04/17/2018        ABGs:   Lab Results   Component Value Date    PCO2ART 24.7 07/25/2014    BEART -0.5 07/25/2014        Type & Screen (If Applicable):  No results found for: LABABO, Lily    Anesthesia Evaluation  Patient summary reviewed and Nursing notes reviewed no history of anesthetic complications:   Airway: Mallampati: III     Neck ROM: full   Dental:          Pulmonary:   (+) asthma:                            Cardiovascular:    (+) angina:,                   Neuro/Psych:   (+) neuromuscular disease:, headaches:,             GI/Hepatic/Renal:   (+) GERD:,          ROS comment: obesity.    Endo/Other:    (+) Diabetes, hypothyroidism::., .                 Abdominal:           Vascular:                                        Anesthesia Plan      MAC     ASA 2     (Medications & allergies reviewed  All available lab & EKG data reviewed)  Induction: intravenous.      Anesthetic plan and risks discussed with patient.      Plan discussed with CRNA.                  Kathryne Eriksson, MD   04/17/2018

## 2018-04-17 NOTE — Anesthesia Post-Procedure Evaluation (Signed)
Department of Anesthesiology  Postprocedure Note    Patient: Jasmine Strickland  MRN: [id number]  Birthdate: 08/09/1971  Date of evaluation: 04/17/2018  Time:  3:16 PM     Procedure Summary     Date:  04/17/18 Room / Location:  [facility] / [facility]    Anesthesia Start:  1209 Anesthesia Stop:  1229    Procedures:       COLONOSCOPY POLYPECTOMY SNARE/COLD BIOPSY (N/A )      COLONOSCOPY WITH BIOPSY (N/A ) Diagnosis:  (DIARRHEA)    Surgeon:  Cyndi Bender, MD Responsible Provider:  Kathryne Eriksson, MD    Anesthesia Type:  MAC ASA Status:  2          Anesthesia Type: MAC    Aldrete Phase I: Aldrete Score: 10    Aldrete Phase II: Aldrete Score: 10    Last vitals: Reviewed and per EMR flowsheets.       Anesthesia Post Evaluation    Comments: Postoperative Anesthesia Note    Name:    Jasmine Strickland  MRN:      [id number]    Patient Vitals in the past 12 hrs:  04/17/18 1300, BP:108/72, Pulse:64, Resp:16, SpO2:99 %  04/17/18 1245, BP:112/76, Pulse:63, Resp:16, SpO2:98 %  04/17/18 1241, BP:112/69, Pulse:66, Resp:16, SpO2:97 %  04/17/18 1236, BP:104/68, Pulse:68, Resp:16, SpO2:95 %  04/17/18 1230, BP:98/60, Temp:97.7 ??F (36.5 ??C), Temp HWE:[id number], Pulse:66, Resp:14, SpO2:98 %  04/17/18 1056, BP:113/68, Temp:97.9 ??F (36.6 ??C), Temp VEL:[id number], Pulse:73, Resp:16, SpO2:98 %, Height:5' 2"  (1.575 m), Weight:181 lb (82.1 kg)     LABS:    CBC  Lab Results       Component                Value               Date/Time                  WBC                      6.2                 07/30/2017 10:21 PM        HGB                      13.5                07/30/2017 10:21 PM        HCT                      40.0                07/30/2017 10:21 PM        PLT                      261                 07/30/2017 10:21 PM   RENAL  Lab Results       Component                Value               Date/Time                  NA  136                 07/30/2017 10:21 PM        K                        4.4                  07/30/2017 10:21 PM        CL                       102                 07/30/2017 10:21 PM        CO2                      26                  07/30/2017 10:21 PM        BUN                      8                   07/30/2017 10:21 PM        CREATININE               0.7                 07/30/2017 10:21 PM        GLUCOSE                  83                  07/30/2017 10:21 PM   COAGS  Lab Results       Component                Value               Date/Time                  PROTIME                  23.5 (H)            07/31/2017 04:23 AM        INR                      1.4                 03/31/2018 04:31 PM        INR                      2.06 (H)            07/31/2017 04:23 AM        APTT                     39.7 (H)            07/30/2017 10:21 PM     Intake & Output:  No intake/output data recorded.    Nausea & Vomiting:  No    Level of Consciousness:  Awake    Pain Assessment:  Adequate analgesia    Anesthesia Complications:  No apparent anesthetic complications    SUMMARY      Vital  signs stable  OK to discharge from Stage I post anesthesia care.  Care transferred from Anesthesiology department on discharge from perioperative area

## 2018-04-17 NOTE — Op Note (Signed)
[facility]    [address]   [place] [address]   Phone: [phone]    Colonoscopy Procedure Note    Patient: Jasmine Strickland  DOB: 19-Jun-1971    Procedure: Colonoscopy     Date:  04/17/2018     Endoscopist:  Cyndi Bender, MD    Referring Physician:  Margaretmary Dys, MD    Preoperative Diagnosis:  DIARRHEA    Postoperative Diagnosis:  See impression    Anesthesia: Anesthesia: MAC  Sedation: see anesthesia notes for details.    Start Time: 12:12  Stop Time: 12:27  Withdrawal time: 10 minutes  ASA Class: 3  Mallampati: II (soft palate, uvula, fauces visible)    Indications: This is a 47 y.o. year old female who presents today for colonoscopy for longstanding diarrhea. She feels her symptoms are worse over the last 1 year or so. She had a tubular adenoma removed at colonoscopy in 2014 and hence is also due for a 5 year followup colonoscopy for surveillance. Will plan a colonoscopy with MAC. Random colon biopsies can be done at colonoscopy again to rule out microscopic colitis.  She is on coumadin for protein S deficiency and has a history of PE in the past, she was bridged with Lovenox and had the last dose 24 hours ago.        Procedure Details  Informed consent was obtained for the procedure, including sedation.  Risks of perforation, hemorrhage, adverse drug reaction and aspiration were discussed. The patient was placed in the left lateral decubitus position.  Based on the pre-procedure assessment, including review of the patient's medical history, medications, allergies, and review of systems, she had been deemed to be an appropriate candidate for conscious sedation; she was therefore sedated with the medications listed below.   The patient was monitored continuously with ECG tracing, pulse oximetry, blood pressure monitoring, and direct observations. rectal examination was performed.  The colonoscope was inserted into the rectum and advanced under direct  vision to the terminal ileum.  The quality of the colonic preparation was good.  A careful inspection was made as the colonoscope was withdrawn, including a retroflexed view of the rectum; findings and interventions are described below.  Appropriate photodocumentation was obtained.  Patient tolerated the procedure well.    Findings: -Medium internal hemorrhoids. Mild diverticulosis in the sigmoid. One 6 mm sessile ascending colon polyp removed with cold snare. Random colon biopsies done to rule out microscopic colitis for diarrhea. Terminal ileum appears normal.    - Anesthesia issues: no    Specimens: Was Obtained: bottle A, random colon biopsies, r/o microscopic colitis  Bottle B, sessile polyp, ascending colon, cold snare    Complications:   None    Estimated blood loss: minimal    Disposition:   PACU - hemodynamically stable.    Impression:   -Medium internal hemorrhoids. Mild diverticulosis in the sigmoid. One 6 mm sessile ascending colon polyp removed with cold snare. Random colon biopsies done to rule out microscopic colitis for diarrhea. Terminal ileum appears normal.    Recommendations:  -Hold Lovenox or coumadin for 48 hours, restart after that per PCP instructions.  - Follow back in office 4 weeks  - Await pathology. Likely 5 year followup for colonoscopy given prior history of adenomatous colon polyps.        Elton Heid S Schelly Chuba 04/17/18 12:04 PM

## 2018-04-17 NOTE — H&P (Addendum)
Chief Complaint   Patient presents with   ??? Establish Care   ?? ?? NP- ibs diarrhea   ??  ??  ??  HPI   ??  47 YO WF referred for diarrhea. Patient on coumadin for protein S deficiency. Patient has a long standing >10 year history of GI issues. In the past it has been alternating constipation and diarrhea but patient feels her symptoms have worsened over the last 1 year and mostly diarrhea now, diarrhea occurs intermittently (not every day) and she may still have some constipation maybe one day each week when she does not have a BM at all, this generally happens after periods of diarrhea. Diarrhea occurs on and off in flares and when she has a flare she can go upto 6-8 times with loose stools with a sense of urgency and has to run to the toilet to avoid an accident. No severe abdominal pain. Has bloating and abdominal cramping which is worse with her diarrhea. She denies rectal bleeding or melena. She denies a family history of colon cancer.  Over the years has tried numerous things/meds including probiotics, diet changes, eating gluten free (now been 6 years she has been eating gluten free and this helps a little but does not change her current symptoms). Recently PCP gave her Colestid but she has not started this yet. She says she thinks she has tried it before and it did not help. No history of pancreatitis. No significant alcohol use.  Extensive prior GI workup with [facility] in 2014 including EGD (normal) with negative duodenal biopsies. Colonoscopy with random colon biopsies negative for microscopic colitis. One 7 mm polyp removed by cold snare at 25 cms, path showed tubular adenoma. EGD/colonoscopy report reviewed in Epic Care everywhere.  ??  ??  ??  PAST MEDICAL HISTORY   ??  Past??Medical??History   Past Medical History:   Diagnosis Date   ??? Asthma ??   ??? Diabetes mellitus (HCC) ??   ??? Fibromyalgia ??   ??? GERD (gastroesophageal reflux disease) ??   ??? Hypothyroidism ??   ??? IBS (irritable bowel syndrome) ??   ??? Migraine ??    ??? Pleurisy ??   ?? multiple episodes "several yrs ago"   ??? Protein S deficiency (HCC) ??   ??? Pulmonary embolism (HCC) ??   ??? Type II or unspecified type diabetes mellitus without mention of complication, not stated as uncontrolled ??      ??  FAMILY HISTORY   Family??History   No family history on file.     SOCIAL HISTORY   ??  Social History      ??        Socioeconomic History   ??? Marital status: Married   ?? ?? Spouse name: Not on file   ??? Number of children: Not on file   ??? Years of education: Not on file   ??? Highest education level: Not on file   Occupational History   ??? Not on file   Social Needs   ??? Financial resource strain: Not on file   ??? Food insecurity:   ?? ?? Worry: Not on file   ?? ?? Inability: Not on file   ??? Transportation needs:   ?? ?? Medical: Not on file   ?? ?? Non-medical: Not on file   Tobacco Use   ??? Smoking status: Never Smoker   ??? Smokeless tobacco: Never Used   Substance and Sexual Activity   ??? Alcohol use: No   ???  Drug use: No   ??? Sexual activity: Yes   ?? ?? Partners: Male   Lifestyle   ??? Physical activity:   ?? ?? Days per week: Not on file   ?? ?? Minutes per session: Not on file   ??? Stress: Not on file   Relationships   ??? Social connections:   ?? ?? Talks on phone: Not on file   ?? ?? Gets together: Not on file   ?? ?? Attends religious service: Not on file   ?? ?? Active member of club or organization: Not on file   ?? ?? Attends meetings of clubs or organizations: Not on file   ?? ?? Relationship status: Not on file   ??? Intimate partner violence:   ?? ?? Fear of current or ex partner: Not on file   ?? ?? Emotionally abused: Not on file   ?? ?? Physically abused: Not on file   ?? ?? Forced sexual activity: Not on file   Other Topics Concern   ??? Not on file   Social History Narrative   ??? Not on file      ??  SURGICAL HISTORY   ??  Past??Surgical??History   Past Surgical History:   Procedure Laterality Date   ??? CHOLECYSTECTOMY ?? ??   ??? ENDOMETRIAL ABLATION ?? ??   ??? OTHER SURGICAL HISTORY ?? 12/23/2012   ?? laparoscopic cholecystectomy    ??? PELVIC LAPAROSCOPY ?? ??      ??  CURRENT MEDICATIONS   (This list may include medications prescribed during this encounter as epic can not insert only the list prior to this encounter.)  Current??Outpatient??Rx   Current Outpatient Rx   Medication Sig Dispense Refill   ??? colestipol (COLESTID) 1 g tablet Take 1 tablet by mouth 2 times daily 60 tablet 3   ??? NP THYROID 30 MG tablet TAKE ONE TABLET BY MOUTH DAILY 30 tablet 5   ??? propranolol (INDERAL LA) 60 MG extended release capsule TAKE ONE CAPSULE BY MOUTH EVERY DAY 30 capsule 5   ??? methocarbamol (ROBAXIN) 500 MG tablet TAKE ONE TABLET THREE TIMES A DAY 60 tablet 2   ??? ondansetron (ZOFRAN) 4 MG tablet TAKE ONE TABLET BY MOUTH EVERY EIGHT HOURS AS NEEDED FOR NAUSEA 60 tablet 2   ??? warfarin (COUMADIN) 5 MG tablet Take daily as directed 60 tablet 5   ??? warfarin (COUMADIN) 1 MG tablet ??  Take 5 mg by mouth daily at 1800 Pt takes 7.5 mg every day except Monday and Thursday she takes 5 mg. ?? ??         ALLERGIES   ??        Allergies   Allergen Reactions   ??? Phenergan [Promethazine Hcl] Anaphylaxis   ??? Azithromycin Swelling   ??? Gluten Meal ??   ??? Lactose Diarrhea   ??? Orange Syrup ??   ??? Tomato ??   ??? Topamax [Topiramate] Other (See Comments)   ?? ?? Chest pain   ??? Codeine Nausea And Vomiting   ??? Hydrocodone Nausea And Vomiting   ??  IMMUNIZATIONS   ??  There is no immunization history on file for this patient.  REVIEW OF SYSTEMS   ??  Constitutional: denies fever and unexpected weight change.   HENT: Negative for ear pain, hearing loss and nosebleeds.    Eyes: Negative for pain and visual disturbance.   Respiratory: Negative for cough, shortness of breath and wheezing.    Cardiovascular: Negative for chest  pain, palpitations and leg swelling.   Gastrointestinal: see HPI for details.  Endocrine: Negative for polydipsia, polyphagia and polyuria.   Genitourinary: Negative for difficulty urinating, dysuria, hematuria and urgency.   Musculoskeletal: Positive for arthralgias and back  pain.   Skin: Negative for pallor and rash.   Allergic/Immunologic: Negative for environmental allergies and immunocompromised state.   Neurological: Negative for seizures, syncope.   Hematological: Negative for adenopathy. Does not bruise/bleed easily.   Psychiatric/Behavioral: Negative for agitation, confusion, hallucinations.  ??  PHYSICAL EXAM   Ht 5' 1.8" (1.57 m)    Wt 181 lb (82.1 kg)    BMI 33.32 kg/m??       Wt Readings from Last 3 Encounters:   03/31/18 181 lb (82.1 kg)   03/12/18 182 lb 6.4 oz (82.7 kg)   12/04/17 191 lb (86.6 kg)   ??BP 108/72    Pulse 64    Temp 97.7 ??F (36.5 ??C) (Temporal)    Resp 16    Ht 5\' 2"  (1.575 m)    Wt 181 lb (82.1 kg)    SpO2 99%    BMI 33.11 kg/m??     Constitutional: AAO3, No acute distress  HEENT: no pallor or icterus.   Neck: supple, no adenopathy  Cardiovascular: Normal heart rate, Normal rhythm, No murmurs,.   RS: Normal breath sounds, No wheezing,   Abdomen: soft, non tender, obese, BS+. No hepatosplenomegaly.  Extremities:  No edema.  Neuro: AAO3, non focal.  ??  ??  FINAL IMPRESSION   ??  ??  Longstanding diarrhea and also intermittently has mild constipation. Likely functional/irritable bowel syndrome. She feels her symptoms are worse over the last 1 year or so. She had a tubular adenoma removed at colonoscopy in 2014 and hence is due for a 5 year followup colonoscopy for surveillance. Will plan a colonoscopy with MAC. Random colon biopsies can be done at colonoscopy again to rule out microscopic colitis.  She is on coumadin for protein S deficiency and has a history of PE in the past, will check if she needs bridging with Lovenox, if this is done last dose of Lovenox should be at least 24 hours before the procedure. This was discussed with her.  Check stool studies  Trial of Metamucil or Citrucel fiber supplementation daily.  Prior duodenal biopsies have been negative  Has tried probiotics without benefit.  Risk of drug interactions with warfarin for Rifaximin and  colestid.  Cannot use Viberzi post cholecystectomy

## 2018-04-17 NOTE — Progress Notes (Signed)
Colonoscopy completed,pt to recovery.

## 2018-04-17 NOTE — Progress Notes (Signed)
Recovery completed,discharge instructions given to pt and spouse,verbalize understanding.  Pt discharged home stable condition.

## 2018-04-21 MED ORDER — PANCRELIPASE (LIP-PROT-AMYL) 24000-76000 UNITS PO CPEP
24000-76000 units | ORAL_CAPSULE | ORAL | 1 refills | Status: DC
Start: 2018-04-21 — End: 2019-09-01

## 2018-04-24 DIAGNOSIS — G8929 Other chronic pain: Secondary | ICD-10-CM

## 2018-04-24 NOTE — ED Provider Notes (Signed)
Emergency Department Provider Note     Location: [facility]  04/24/2018    I independently performed a history and physical on Jasmine Strickland.   All diagnostic, treatment, and disposition decisions were made by myself in conjunction with the mid-level provider.     Briefly, this is a 47 y.o. female here for concern for possible blood clot, b/l LE pain.  History of protein S deficiency, has had PE in the past, denies chest pain or shortness of breath    ED Triage Vitals [04/24/18 2141]   BP Temp Temp Source Pulse Resp SpO2 Height Weight   (!) 124/91 97.6 ??F (36.4 ??C) Oral 103 16 98 % 5\' 2"  (1.575 m) 181 lb (82.1 kg)      no acute distress, A&Ox4, heart RRR, no M/G/R, lungs CTAB, resp. Nonlabored, no LE edema, no calf TTP, neg homans, 2+ PT and DP pulses, distal sensation intact b/l    Patient seen and examined.      Results for orders placed or performed during the hospital encounter of 04/24/18   D-dimer, quantitative   Result Value Ref Range    D-Dimer, Quant <200 0 - 229 ng/mL DDU   Protime-INR   Result Value Ref Range    Protime 11.5 9.8 - 13.0 sec    INR 1.01 0.86 - 1.14   APTT   Result Value Ref Range    aPTT 34.9 26.0 - 36.0 sec   Urine, reflex to culture   Result Value Ref Range    Color, UA Yellow Straw/Yellow    Clarity, UA Clear Clear    Glucose, Ur Negative Negative mg/dL    Bilirubin Urine Negative Negative    Ketones, Urine Negative Negative mg/dL    Specific Gravity, UA 1.010 1.005 - 1.030    Blood, Urine Negative Negative    pH, UA 5.5 5.0 - 8.0    Protein, UA Negative Negative mg/dL    Urobilinogen, Urine 0.2 <2.0 E.U./dL    Nitrite, Urine Negative Negative    Leukocyte Esterase, Urine Negative Negative    Microscopic Examination Not Indicated     Urine Reflex to Culture Not Indicated     Urine Type Not Specified          For further details of [name]'s emergency department encounter, please see Joen Laura, NP's documentation.    47 year old female concerned for  DVT with bilateral lower extremity pain, with negative d-dimer, I explained the low likelihood of DVT especially bilateral at the same time, she is on bridging Lovenox, but is subtherapeutic currently on her INR, Strict return precautions given, will return if any worsening symptoms or new concerns, patient verbalized understanding of plan, felt comfortable going home.    Orders Placed This Encounter   Procedures   ??? D-dimer, quantitative   ??? Protime-INR   ??? APTT   ??? Urine, reflex to culture     Clinical Impression:  1. Chronic pain of both lower extremities    2. Subtherapeutic international normalized ratio (INR)      Disposition:  Patient discharged home in stable condition.    This chart was generated in part by using Dragon Dictation system and may contain errors related to that system including errors in grammar, punctuation, and spelling, as well as words and phrases that may be inappropriate. If there are any questions or concerns please feel free to contact the dictating provider for clarification.  Smitty Pluck, DO  05/14/18 [phone]

## 2018-04-24 NOTE — ED Provider Notes (Signed)
CHIEF COMPLAINT  Chief Complaint   Patient presents with   . Leg Pain     pt states "I would like to be checked for a clot".  Pt states takes warfarin, but had a colonoscopy last week and was off of her warfarin.  Pt states hx of fibromyalgia, took her medications which usually helps, but they did not.  Pt also hx of PE.  Pt denies leg swelling or SOB.       HISTORY OF PRESENT ILLNESS  Jasmine Strickland is a 47 y.o. 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  She is concerned because she took her prescribed medications for her fibromyalgia is typically helps her leg pain did not resolve tonight.  She's not had any leg swelling or shortness of breath but she had a colonoscopy last week and was taken off of warfarin.  He has been taken Lovenox until therapeutic on coumadin.   Pain is a moderate continuous aching unrelieved by her prescribed medications.  Nothing seem to make the pain worse.  Patient denies any exertional chest pain, dyspnea, diaphoresis, radiation of pain, nausea, vomiting, abdominal pain, palpitations, syncope, orthopnea, changes in sensation, recent travel, prolonged bed rest, missed medication dose, edema or paroxysmal nocturnal dyspnea.    No other complaints, modifying factors or associated symptoms.   Nursing notes reviewed.     Past Medical History:   Diagnosis Date   . Asthma    . Diabetes mellitus (HCC)    . Fibromyalgia    . GERD (gastroesophageal reflux disease)    . Hypothyroidism    . IBS (irritable bowel syndrome)    . Migraine    . Pleurisy     multiple episodes "several yrs ago"   . Protein S deficiency (HCC)    . Pulmonary embolism (HCC)    . Type II or unspecified type diabetes mellitus without mention of complication, not stated as uncontrolled      Past Surgical History:   Procedure Laterality Date   . CHOLECYSTECTOMY     . COLONOSCOPY  04/17/2018     Colon Polyp; Diverticulosis, Hemorrhoids   . COLONOSCOPY N/A 04/17/2018    COLONOSCOPY POLYPECTOMY SNARE/COLD BIOPSY performed by Willaim Sheng, MD at [facility]   . COLONOSCOPY N/A 04/17/2018    COLONOSCOPY WITH BIOPSY performed by Willaim Sheng, MD at [facility]   . ENDOMETRIAL ABLATION     . OTHER SURGICAL HISTORY  12/23/2012    laparoscopic cholecystectomy   . PELVIC LAPAROSCOPY       History reviewed. No pertinent family history.  Social History     Occupational History   . Not on file   Tobacco Use   . Smoking status: Never Smoker   . Smokeless tobacco: Never Used   Substance and Sexual Activity   . Alcohol use: No   . Drug use: No   . Sexual activity: Yes     Partners: Male     No current facility-administered medications on file prior to encounter.      Current Outpatient Medications on File Prior to Encounter   Medication Sig Dispense Refill   . lipase-protease-amylase (CREON) 24000-76000 units delayed release capsule 3 capsules with each meal and 1 capsule with each snack (do not exceed 12 capsules a day) 360 capsule 1   . enoxaparin (LOVENOX) 80 MG/0.8ML injection  Inject into the skin 2 times daily     . NP THYROID 30 MG tablet TAKE ONE TABLET BY MOUTH DAILY 30 tablet 5   . propranolol (INDERAL LA) 60 MG extended release capsule TAKE ONE CAPSULE BY MOUTH EVERY DAY 30 capsule 5   . methocarbamol (ROBAXIN) 500 MG tablet TAKE ONE TABLET THREE TIMES A DAY 60 tablet 2   . ondansetron (ZOFRAN) 4 MG tablet TAKE ONE TABLET BY MOUTH EVERY EIGHT HOURS AS NEEDED FOR NAUSEA 60 tablet 2   . warfarin (COUMADIN) 5 MG tablet Take daily as directed 60 tablet 5   . warfarin (COUMADIN) 1 MG tablet   Take 5 mg by mouth daily at 1800 Pt takes 7.5 mg every day except Monday and Thursday she takes 5 mg.       Allergies   Allergen Reactions   . Phenergan [Promethazine Hcl] Anaphylaxis   . Azithromycin Swelling   . Gluten Meal    . Lactose Diarrhea   . Orange Syrup    . Tomato    . Topamax  [Topiramate] Other (See Comments)     Chest pain   . Codeine Nausea And Vomiting   . Hydrocodone Nausea And Vomiting       REVIEW OF SYSTEMS  10 systems reviewed, pertinent positives per HPI otherwise noted to be negative    PHYSICAL EXAM  BP 113/78   Pulse 81   Temp 98 F (36.7 C) (Oral)   Resp 18   Ht 5\' 2"  (1.575 m)   Wt 181 lb (82.1 kg)   LMP 04/11/2018   SpO2 98%   BMI 33.11 kg/m     Physical Exam   Constitutional: She is oriented to person, place, and time. She appears well-developed and well-nourished. She is active.  Non-toxic appearance. No distress.   HENT:   Head: Normocephalic and atraumatic.   Eyes: Conjunctivae are normal. Right eye exhibits no discharge. Left eye exhibits no discharge.   Neck: Normal range of motion. Neck supple.   Cardiovascular: Normal rate, regular rhythm and normal heart sounds.   No murmur heard.  Pulses:       Dorsalis pedis pulses are 2+ on the right side, and 2+ on the left side.        Posterior tibial pulses are 2+ on the right side, and 2+ on the left side.   Pulmonary/Chest: Effort normal and breath sounds normal. No respiratory distress. She has no wheezes.   Abdominal: Soft. Bowel sounds are normal.   Musculoskeletal:        Right knee: Normal.        Left knee: Normal.        Right ankle: Normal.        Left ankle: Normal.        Right lower leg: Normal.        Left lower leg: Normal.   No lower sternum any edema, swelling, temperature discrepancy, negative Homans, pulses intact   Neurological: She is alert and oriented to person, place, and time. She has normal strength. Coordination normal.   Skin: Skin is warm, dry and intact. No rash noted. She is not diaphoretic.   Psychiatric: She has a normal mood and affect. Her speech is normal and behavior is normal. Judgment and thought content normal. Cognition and memory are normal.   Nursing note and vitals reviewed.      Labs:    Labs Reviewed   D-DIMER, QUANTITATIVE    Narrative:  Performed at:  [facility]  [address]  [place], [place] [address]   Phone [phone]   PROTIME-INR    Narrative:     Performed at:  [facility]  [address]  [place], [place] [address]   Phone [phone]   APTT    Narrative:     Performed at:  [facility]  [address],  [place], [place] [address]   Phone [phone]   URINE RT REFLEX TO CULTURE    Narrative:     Performed at:  [facility]  [address],  [place], [place] [address]   Phone [phone]       All other labs were within normal range or not returned as of this dictation.    EKG: All EKG's are interpreted by the Emergency Department Physician who either signs or Co-signs this chart in the absence of a cardiologist.Please see their note for interpretation of EKG.        RADIOLOGY:   Non-plain film images such as CT, Ultrasound and MRI are read by the radiologist. Plain radiographic images are visualized and preliminarily interpreted by the  ED Provider with the below findings:    Interpretation per the Radiologistbelow, if available at the time of this note:    No orders to display     No results found.        ED COURSE/ MDM    Patient was given the following medications:  Medications - No data to display         Patient seen and evaluated.  labs ordered/reviewed and testing results reviewed with patient and/or family    After initial evaluation, differential diagnostic considerations included: DVT, chronic pain syndrome, PE,     Wells criteria for DVT: score of 1 with 22% probability.  Will check ddimer.  Labs ordered. Urinalysis benign, D-Dimer negative, Subtherapeutic INR. Patient is aware and is being bridged with Lovenox. With bilateral symptoms, therapeutic treatment, and negative d-dimer dvt unlikely.  Stress importance of follow up and close management until INR is theraputic. Afebrile, non toxic inappearance, in no acute distress, pulse  ox is 98% on room air and is not hypoxic.  Shared medical decision was made between the patient myself and we agreed the patient could be discharged home with outpatient follow-up. Will be discharged to home in stable condition with outpatient follow up.  Instructed patient and/or family to return to the emergency room for new or worsening symptoms and any concerns.  Instructed to call referrals below to discuss ER visit and follow-up plan.    I estimate there is LOW risk for FRACTURE, COMPARTMENT SYNDROME, DEEP VENOUS THROMBOSIS, SEPTIC ARTHRITIS, TENDON OR NEUROVASCULAR INJURY, thus I consider the discharge disposition reasonable.    The patient tolerated their visit well.  The patient and / or the family were informed of the results of any tests, a time was given to answer questions, a plan was proposed and they agreed with plan.      Patient was given scripts for the following medications. I counseled patient how to take these medications.   Discharge Medication List as of 04/25/2018 12:27 AM          CLINICAL IMPRESSION  1. Chronic pain of both lower extremities    2. Subtherapeutic international normalized ratio (INR)        Blood pressure 113/78, pulse 81, temperature  98 F (36.7 C), temperature source Oral, resp. rate 18, height 5\' 2"  (1.575 m), weight 181 lb (82.1 kg), last menstrual period 04/11/2018, SpO2 98 %, not currently breastfeeding.    DISPOSITION  DISPOSITION Decision To Discharge 04/25/2018 12:17:03 AM      PATIENT REF[place]ED [place]  [address]  [place] [employer] [place] [employer] [address]  [phone]    Call   follow up    [facility]  [address]  [place] [address]  [phone]  Go to   for concerns, worsening or emergent symptoms      DISCHARGE MEDICATIONS:  Discharge Medication List as of 04/25/2018 12:27 AM          DISCONTINUED MEDICATIONS:  Discharge Medication List as of 04/25/2018 12:27 AM                   Joen Laura, APRN - CNP (electronically signed)    [place]  [facility]    Please note that this chart was generated using Dragon dictation software. Although every effort was made to ensure the accuracy of this automated transcription, some errors intranscription may have occurred      Dr. Dorena Bodo spent face to face time with patient and agrees with above dx and treatment.           Joen Laura, APRN - CNP  05/01/18 1025

## 2018-04-24 NOTE — ED Triage Notes (Signed)
Chief Complaint   Patient presents with   . Leg Pain     pt states "I would like to be checked for a clot".  Pt states takes warfarin, but had a colonoscopy last week and was off of her warfarin.  Pt states hx of fibromyalgia, took her medications which usually helps, but they did not.  Pt also hx of PE.  Pt denies leg swelling or SOB.

## 2018-04-25 ENCOUNTER — Inpatient Hospital Stay
Admit: 2018-04-25 | Discharge: 2018-04-25 | Disposition: A | Discharge status: Home / Self Care | Payer: BLUE CROSS/BLUE SHIELD | Attending: Emergency Medicine

## 2018-04-25 LAB — URINALYSIS WITH REFLEX TO CULTURE
Bilirubin Urine: NEGATIVE
Blood, Urine: NEGATIVE
Glucose, Ur: NEGATIVE mg/dL
Ketones, Urine: NEGATIVE mg/dL
Leukocyte Esterase, Urine: NEGATIVE
Nitrite, Urine: NEGATIVE
Protein, UA: NEGATIVE mg/dL
Specific Gravity, UA: 1.01 (ref 1.005–1.030)
Urobilinogen, Urine: 0.2 E.U./dL (ref <2.0)
pH, UA: 5.5 (ref 5.0–8.0)

## 2018-04-25 LAB — D-DIMER, QUANTITATIVE: D-Dimer, Quant: <200 ng/mL DDU (ref 0–229)

## 2018-04-25 LAB — APTT: aPTT: 34.9 s (ref 26.0–36.0)

## 2018-04-25 LAB — PROTIME-INR
INR: 1.01 (ref 0.86–1.14)
Protime: 11.5 s (ref 9.8–13.0)

## 2018-04-25 NOTE — Discharge Instructions (Signed)
IMPORTANT:  If you have any trouble getting in to see the physician that we have referred you to today, please call the [facility] at [phone].  Please leave a voice message if they are unavailable and they will return your call.    If you were prescribed an outpatient test, please call [facility] at [phone] to schedule an appointment for your test that was ordered.       ADDITIONAL INSTRUCTIONS FOR ALL PATIENTS:  -If you have been prescribed an antibiotic TAKE IT AS DIRECTED UNTIL IT IS ALL FINISHED.  -If you HAVE RECEIVED OR BEEN PRESCRIBED A MEDICATION THAT MAY CAUSE DROWSINESS. DO NOT DRIVE, DRINK ALCOHOL, OR OPERATE MACHINERY THAT REQUIRES YOU TO BE ALERT.    -If you had an EKG and/or X-Ray reading made in the Emergency Department, it will be reviewed by a Cardiologist and/or Radiologist. If the review changes your diagnosis, you will be contacted.  -If you had a specimen collected for culture, a CULTURE REPORT takes 48-72 hours to generate: You will be contacted if a change in treatment is needed.    -Return if your condition worsens or if you have severe pain, worsening of symptoms.

## 2018-04-30 ENCOUNTER — Encounter: Admit: 2018-04-30 | Discharge: 2018-04-30 | Payer: BLUE CROSS/BLUE SHIELD | Primary: Family Medicine

## 2018-04-30 DIAGNOSIS — D6859 Other primary thrombophilia: Secondary | ICD-10-CM

## 2018-04-30 LAB — POCT PT/INR: INR,(POC): 1.8

## 2018-06-01 MED ORDER — WARFARIN SODIUM 5 MG PO TABS
5 MG | ORAL_TABLET | ORAL | 5 refills | Status: DC
Start: 2018-06-01 — End: 2018-11-11

## 2018-06-01 NOTE — Telephone Encounter (Signed)
Last office visit 03/12/2018, advised to follow-up 0, next appointment 06/08/2018.

## 2018-06-08 ENCOUNTER — Encounter: Attending: Gastroenterology | Primary: Family Medicine

## 2018-09-10 ENCOUNTER — Ambulatory Visit
Admit: 2018-09-10 | Discharge: 2018-09-10 | Payer: PRIVATE HEALTH INSURANCE | Attending: Family | Primary: Family Medicine

## 2018-09-10 DIAGNOSIS — D6859 Other primary thrombophilia: Secondary | ICD-10-CM

## 2018-09-10 LAB — POCT PT/INR: INR,(POC): 2.4

## 2018-09-10 MED ORDER — THYROID 30 MG PO TABS
30 MG | ORAL_TABLET | ORAL | 5 refills | Status: DC
Start: 2018-09-10 — End: 2019-04-20

## 2018-09-10 MED ORDER — METHOCARBAMOL 500 MG PO TABS
500 MG | ORAL_TABLET | ORAL | 3 refills | Status: DC
Start: 2018-09-10 — End: 2019-10-15

## 2018-09-10 MED ORDER — PROPRANOLOL HCL ER 60 MG PO CP24
60 MG | ORAL_CAPSULE | ORAL | 5 refills | Status: DC
Start: 2018-09-10 — End: 2018-11-11

## 2018-09-10 NOTE — Progress Notes (Signed)
09/10/2018     Jasmine Strickland (DOB:  [birth year]) is a 47 y.o. female, here for evaluation of the following medical concerns:    Frequent falls:  She saw neurology "awhile ago" and stated that she had white spots on her MRI.      Memory issues:    Started Fall 2018  She will forget how to get home or how to count change. She would like to get another referral for a neurologist due to her getting new insurance. She states she has not gotten an answer for her memory issues.     Started to clear up after a bit. Was not falling and memory imropved. Middle of July she started falling more. Last fall was in July. She has not hit her head for these and did not seek care after any of these falls. She catches herself on her knees.     She has been to [facility] to see if she has MS.       She is currently doping well on her medical regimen with no side effects.       Review of Systems   Constitutional: Positive for fatigue. Negative for activity change, appetite change, chills, diaphoresis, fever and unexpected weight change.   HENT: Negative for congestion, ear discharge, ear pain, hearing loss, nosebleeds, postnasal drip, rhinorrhea, sinus pressure, sinus pain, sneezing, sore throat, tinnitus, trouble swallowing and voice change.    Eyes: Negative for photophobia, pain, discharge, redness and itching.   Respiratory: Negative for cough, choking, chest tightness, shortness of breath, wheezing and stridor.    Cardiovascular: Negative for chest pain, palpitations and leg swelling.   Gastrointestinal: Negative for abdominal pain, blood in stool, constipation, diarrhea, nausea and vomiting.   Endocrine: Negative for cold intolerance, heat intolerance, polydipsia and polyuria.   Genitourinary: Negative for difficulty urinating, dysuria, enuresis, flank pain, frequency, hematuria and urgency.   Musculoskeletal: Negative for back pain, gait problem, joint swelling, neck pain and neck stiffness.   Skin: Negative for color  change, pallor, rash and wound.   Allergic/Immunologic: Negative for environmental allergies and food allergies.   Neurological: Negative for dizziness, tremors, syncope, speech difficulty, weakness, light-headedness, numbness and headaches.   Hematological: Negative for adenopathy. Does not bruise/bleed easily.   Psychiatric/Behavioral: Negative for agitation, behavioral problems, confusion, decreased concentration, dysphoric mood, hallucinations, self-injury, sleep disturbance and suicidal ideas. The patient is not nervous/anxious and is not hyperactive.        Prior to Visit Medications    Medication Sig Taking? Authorizing Provider   thyroid (NP THYROID) 30 MG tablet TAKE ONE TABLET BY MOUTH DAILY Yes Dorise Hiss, APRN - CNP   propranolol (INDERAL LA) 60 MG extended release capsule TAKE ONE CAPSULE BY MOUTH EVERY DAY Yes Dorise Hiss, APRN - CNP   methocarbamol (ROBAXIN) 500 MG tablet TAKE ONE TABLET THREE TIMES A DAY Yes Dorise Hiss, APRN - CNP   warfarin (COUMADIN) 5 MG tablet TAKE UP TO 2 TABLETS DAILY AS DIRECTED Yes Margaretmary Dys, MD   lipase-protease-amylase (CREON) 24000-76000 units delayed release capsule 3 capsules with each meal and 1 capsule with each snack (do not exceed 12 capsules a day) Yes Ashutosh Otis Brace, MD   ondansetron (ZOFRAN) 4 MG tablet TAKE ONE TABLET BY MOUTH EVERY EIGHT HOURS AS NEEDED FOR NAUSEA Yes Margaretmary Dys, MD   warfarin (COUMADIN) 5 MG tablet Take daily as directed Yes Margaretmary Dys, MD   warfarin (COUMADIN) 1 MG tablet  Take 5 mg by mouth daily at 1800 Pt takes 7.5 mg every day except Monday and Thursday she takes 5 mg. Yes Historical Provider, MD        Social History     Tobacco Use   ??? Smoking status: Never Smoker   ??? Smokeless tobacco: Never Used   Substance Use Topics   ??? Alcohol use: No        Vitals:    09/10/18 1334   BP: 122/80   Pulse: 82   SpO2: 98%   Weight: 194 lb (88 kg)     Estimated body mass index is 35.48 kg/m?? as calculated from the  following:    Height as of 04/24/18: 5\' 2"  (1.575 m).    Weight as of this encounter: 194 lb (88 kg).    Physical Exam   Constitutional: She is oriented to person, place, and time. Vital signs are normal. She appears well-developed and well-nourished. She is sleeping and active. No distress.   HENT:   Head: Normocephalic and atraumatic.   Right Ear: Hearing, tympanic membrane, external ear and ear canal normal.   Left Ear: Hearing, tympanic membrane, external ear and ear canal normal.   Nose: Nose normal. Right sinus exhibits no maxillary sinus tenderness and no frontal sinus tenderness. Left sinus exhibits no maxillary sinus tenderness and no frontal sinus tenderness.   Mouth/Throat: Uvula is midline. Normal dentition. No oropharyngeal exudate, posterior oropharyngeal edema or posterior oropharyngeal erythema. Tonsils are 1+ on the right. Tonsils are 1+ on the left.   Graphic tongue    Eyes: Pupils are equal, round, and reactive to light. Conjunctivae are normal. Right eye exhibits no discharge. Left eye exhibits no discharge.   Neck: Normal range of motion. No JVD present. No tracheal deviation present. No thyromegaly present.   Cardiovascular: Normal rate, regular rhythm, normal heart sounds and intact distal pulses. Exam reveals no friction rub.   No murmur heard.  Pulmonary/Chest: Effort normal and breath sounds normal. No stridor. No respiratory distress. She has no decreased breath sounds. She has no wheezes. She has no rhonchi. She has no rales.   Musculoskeletal: Normal range of motion. She exhibits no edema or tenderness.   Lymphadenopathy:     She has no cervical adenopathy.   Neurological: She is alert and oriented to person, place, and time. She has normal strength. Coordination normal. GCS eye subscore is 4. GCS verbal subscore is 5. GCS motor subscore is 6.   Skin: Skin is warm and dry. Capillary refill takes less than 2 seconds. No rash noted.   Psychiatric: She has a normal mood and affect. Her  speech is normal and behavior is normal. Judgment and thought content normal. Cognition and memory are impaired.       ASSESSMENT/PLAN:  1. Migraine without status migrainosus, not intractable, unspecified migraine type    - propranolol (INDERAL LA) 60 MG extended release capsule; TAKE ONE CAPSULE BY MOUTH EVERY DAY  Dispense: 30 capsule; Refill: 5  - External Referral To Neurology    2. Fibromyalgia    - methocarbamol (ROBAXIN) 500 MG tablet; TAKE ONE TABLET THREE TIMES A DAY  Dispense: 60 tablet; Refill: 3    3. Protein S deficiency ([place])    - COMPREHENSIVE METABOLIC PANEL  - POCT INR    4. Hypothyroidism, unspecified type    - TSH without Reflex  - HEMOGLOBIN A1C  - T4, Free    5. Pure hypercholesterolemia    - LIPID PANEL  6. Type 2 diabetes mellitus without complication, without long-term current use of insulin ([place])    - HEMOGLOBIN A1C  - COMPREHENSIVE METABOLIC PANEL    7. Memory loss    - External Referral To Neurology    Will follow up with lab results.     Return in about 6 months (around 03/11/2019).    An electronic signature was used to authenticate this note.    --Dorise Hiss, APRN - CNP on 09/10/2018 at 2:09 PM

## 2018-09-11 LAB — COMPREHENSIVE METABOLIC PANEL
ALT: 20 U/L (ref 10–40)
AST: 18 U/L (ref 15–37)
Albumin/Globulin Ratio: 1.7 (ref 1.1–2.2)
Albumin: 4.2 g/dL (ref 3.4–5.0)
Alkaline Phosphatase: 97 U/L (ref 40–129)
Anion Gap: 15 (ref 3–16)
BUN: 10 mg/dL (ref 7–20)
CO2: 23 mmol/L (ref 21–32)
Calcium: 9.2 mg/dL (ref 8.3–10.6)
Chloride: 104 mmol/L (ref 99–110)
Creatinine: 0.7 mg/dL (ref 0.6–1.1)
GFR African American: >60 (ref >60)
GFR Non-African American: >60 (ref >60)
Globulin: 2.5 g/dL
Glucose: 75 mg/dL (ref 70–99)
Potassium: 4.7 mmol/L (ref 3.5–5.1)
Sodium: 142 mmol/L (ref 136–145)
Total Bilirubin: 0.4 mg/dL (ref 0.0–1.0)
Total Protein: 6.7 g/dL (ref 6.4–8.2)

## 2018-09-11 LAB — TSH: TSH: 2.03 u[IU]/mL (ref 0.27–4.20)

## 2018-09-11 LAB — LIPID PANEL
Cholesterol, Total: 231 mg/dL — ABNORMAL HIGH (ref 0–199)
HDL: 57 mg/dL (ref 40–60)
LDL Calculated: 140 mg/dL — ABNORMAL HIGH (ref <100)
Triglycerides: 168 mg/dL — ABNORMAL HIGH (ref 0–150)
VLDL Cholesterol Calculated: 34 mg/dL

## 2018-09-11 LAB — HEMOGLOBIN A1C
Hemoglobin A1C: 5.8 %
eAG: 119.8 mg/dL

## 2018-09-11 LAB — T4, FREE: T4 Free: 1.1 ng/dL (ref 0.9–1.8)

## 2018-11-11 ENCOUNTER — Encounter

## 2018-11-11 MED ORDER — WARFARIN SODIUM 5 MG PO TABS
5 MG | ORAL_TABLET | ORAL | 5 refills | Status: DC
Start: 2018-11-11 — End: 2019-10-15

## 2018-11-11 MED ORDER — PROPRANOLOL HCL ER 60 MG PO CP24
60 MG | ORAL_CAPSULE | ORAL | 5 refills | Status: DC
Start: 2018-11-11 — End: 2019-05-17

## 2018-11-11 MED ORDER — ACYCLOVIR 400 MG PO TABS
400 MG | ORAL_TABLET | ORAL | 3 refills | Status: DC
Start: 2018-11-11 — End: 2018-12-15

## 2018-11-11 NOTE — Telephone Encounter (Signed)
Last office visit 09/10/18, advised to follow-up 61m, next appointment 0.  ??

## 2018-11-11 NOTE — Telephone Encounter (Signed)
Last office visit 09/10/2018, advised to follow-up 6 months, next appointment none.

## 2018-11-11 NOTE — Telephone Encounter (Signed)
Last office visit 09/10/18, advised to follow-up 58m, next appointment 0.

## 2018-12-14 MED ORDER — ONDANSETRON HCL 4 MG PO TABS
4 MG | ORAL_TABLET | ORAL | 2 refills | Status: DC
Start: 2018-12-14 — End: 2019-06-28

## 2018-12-14 NOTE — Telephone Encounter (Signed)
Last office visit 09/10/18, advised to follow-up 109m, next appointment 12/15/18.  ??

## 2018-12-15 ENCOUNTER — Ambulatory Visit
Admit: 2018-12-15 | Discharge: 2018-12-15 | Payer: PRIVATE HEALTH INSURANCE | Attending: Family | Primary: Family Medicine

## 2018-12-15 DIAGNOSIS — J189 Pneumonia, unspecified organism: Secondary | ICD-10-CM

## 2018-12-15 MED ORDER — ACYCLOVIR 400 MG PO TABS
400 MG | ORAL_TABLET | ORAL | 3 refills | Status: DC
Start: 2018-12-15 — End: 2020-01-13

## 2018-12-15 MED ORDER — FLUCONAZOLE 150 MG PO TABS
150 MG | ORAL_TABLET | Freq: Every day | ORAL | 0 refills | Status: AC
Start: 2018-12-15 — End: 2018-12-18

## 2018-12-15 MED ORDER — CEFDINIR 300 MG PO CAPS
300 MG | ORAL_CAPSULE | Freq: Two times a day (BID) | ORAL | 0 refills | Status: AC
Start: 2018-12-15 — End: 2018-12-25

## 2018-12-15 MED ORDER — ALBUTEROL SULFATE HFA 108 (90 BASE) MCG/ACT IN AERS
108 (90 Base) MCG/ACT | Freq: Four times a day (QID) | RESPIRATORY_TRACT | 3 refills | Status: DC | PRN
Start: 2018-12-15 — End: 2020-01-13

## 2018-12-15 NOTE — Progress Notes (Signed)
12/15/2018     Jasmine Strickland (DOB:  [birth year]) is a 47 y.o. female, here for evaluation of the following medical concerns:    She went to the ER for SOB and fatigue. She was diagnosed with pneumonia. She is currently taking a Z Pack which is not working. She has not been able to sleep and is not hungry. She does not have a fever but has taken Excedrin.     Husband has albuterol nebulizer, she will do this if she feels like she cant breath.     Review of Systems   Constitutional: Positive for activity change, appetite change and fatigue. Negative for chills, diaphoresis, fever and unexpected weight change.   HENT: Negative for congestion, ear discharge, ear pain, hearing loss, nosebleeds, postnasal drip, rhinorrhea, sinus pressure, sinus pain, sneezing, sore throat, tinnitus, trouble swallowing and voice change.    Eyes: Negative for photophobia, pain, discharge, redness and itching.   Respiratory: Positive for cough, chest tightness and wheezing. Negative for choking, shortness of breath and stridor.    Cardiovascular: Negative for chest pain, palpitations and leg swelling.   Gastrointestinal: Positive for diarrhea and nausea. Negative for abdominal pain, blood in stool, constipation and vomiting.   Endocrine: Negative for cold intolerance, heat intolerance, polydipsia and polyuria.   Genitourinary: Negative for difficulty urinating, dysuria, enuresis, flank pain, frequency, hematuria and urgency.   Musculoskeletal: Negative for back pain, gait problem, joint swelling, neck pain and neck stiffness.   Skin: Negative for color change, pallor, rash and wound.   Allergic/Immunologic: Negative for environmental allergies and food allergies.   Neurological: Negative for dizziness, tremors, syncope, speech difficulty, weakness, light-headedness, numbness and headaches.   Hematological: Negative for adenopathy. Does not bruise/bleed easily.   Psychiatric/Behavioral: Negative for agitation, behavioral problems,  confusion, decreased concentration, dysphoric mood, hallucinations, self-injury, sleep disturbance and suicidal ideas. The patient is not nervous/anxious and is not hyperactive.        Prior to Visit Medications    Medication Sig Taking? Authorizing Provider   azithromycin (ZITHROMAX) 250 MG tablet  Yes Historical Provider, MD   albuterol sulfate HFA (PROAIR HFA) 108 (90 Base) MCG/ACT inhaler Inhale 2 puffs into the lungs every 6 hours as needed for Wheezing Yes Dorise Hiss, APRN - CNP   cefdinir (OMNICEF) 300 MG capsule Take 1 capsule by mouth 2 times daily for 10 days Yes Dorise Hiss, APRN - CNP   acyclovir (ZOVIRAX) 400 MG tablet TAKE 5 TABLETS BY MOUTH ONCE A DAY FOR 5 DAYS Yes Dorise Hiss, APRN - CNP   fluconazole (DIFLUCAN) 150 MG tablet Take 1 tablet by mouth daily for 3 doses Yes Dorise Hiss, APRN - CNP   ondansetron (ZOFRAN) 4 MG tablet TAKE ONE TABLET BY MOUTH EVERY EIGHT HOURS AS NEEDED FOR NAUSEA Yes Dorise Hiss, APRN - CNP   propranolol (INDERAL LA) 60 MG extended release capsule TAKE ONE CAPSULE BY MOUTH EVERY DAY Yes Margaretmary Dys, MD   warfarin (COUMADIN) 5 MG tablet TAKE UP TO 2 TABLETS DAILY AS DIRECTED Yes Margaretmary Dys, MD   thyroid (NP THYROID) 30 MG tablet TAKE ONE TABLET BY MOUTH DAILY Yes Dorise Hiss, APRN - CNP   methocarbamol (ROBAXIN) 500 MG tablet TAKE ONE TABLET THREE TIMES A DAY Yes Dorise Hiss, APRN - CNP   lipase-protease-amylase (CREON) 24000-76000 units delayed release capsule 3 capsules with each meal and 1 capsule with each snack (do not exceed 12 capsules a day) Yes Ashutosh  Otis Brace, MD   warfarin (COUMADIN) 5 MG tablet Take daily as directed Yes Margaretmary Dys, MD   warfarin (COUMADIN) 1 MG tablet   Take 5 mg by mouth daily at 1800 Pt takes 7.5 mg every day except Monday and Thursday she takes 5 mg. Yes Historical Provider, MD        Social History     Tobacco Use   ??? Smoking status: Never Smoker   ??? Smokeless tobacco: Never Used   Substance Use  Topics   ??? Alcohol use: No        Vitals:    12/15/18 1006   BP: 126/80   Pulse: 92   Temp: 98.2 ??F (36.8 ??C)   SpO2: 93%   Weight: 196 lb (88.9 kg)     Estimated body mass index is 35.85 kg/m?? as calculated from the following:    Height as of 04/24/18: 5\' 2"  (1.575 m).    Weight as of this encounter: 196 lb (88.9 kg).    Physical Exam  Vitals signs reviewed.   Constitutional:       General: She is not in acute distress.     Appearance: Normal appearance. She is well-developed.   HENT:      Head: Normocephalic and atraumatic.      Right Ear: Hearing, tympanic membrane, ear canal and external ear normal.      Left Ear: Hearing, tympanic membrane, ear canal and external ear normal.      Nose: Nose normal.      Right Sinus: No maxillary sinus tenderness or frontal sinus tenderness.      Left Sinus: No maxillary sinus tenderness or frontal sinus tenderness.      Mouth/Throat:      Pharynx: No oropharyngeal exudate.   Eyes:      General:         Right eye: No discharge.         Left eye: No discharge.      Conjunctiva/sclera: Conjunctivae normal.      Pupils: Pupils are equal, round, and reactive to light.   Neck:      Musculoskeletal: Normal range of motion.      Thyroid: No thyromegaly.      Vascular: No JVD.      Trachea: No tracheal deviation.   Cardiovascular:      Rate and Rhythm: Normal rate and regular rhythm.      Heart sounds: Normal heart sounds. No murmur. No friction rub.   Pulmonary:      Effort: No respiratory distress.      Breath sounds: No stridor. Examination of the right-upper field reveals rhonchi. Rhonchi present. No decreased breath sounds, wheezing or rales.      Comments: Prior LLL Pneumonia   Musculoskeletal: Normal range of motion.         General: No tenderness.   Lymphadenopathy:      Cervical: No cervical adenopathy.   Skin:     General: Skin is warm and dry.      Capillary Refill: Capillary refill takes less than 2 seconds.      Findings: No rash.   Neurological:      Mental Status: She is  alert and oriented to person, place, and time.      Sensory: Sensation is intact.      Motor: Motor function is intact.      Coordination: Coordination normal.   Psychiatric:         Attention and  Perception: Attention and perception normal.         Mood and Affect: Mood normal.         Speech: Speech normal.         Behavior: Behavior normal. Behavior is cooperative.         Thought Content: Thought content normal.         Cognition and Memory: Cognition normal.         Judgment: Judgment normal.       ASSESSMENT/PLAN:  1. Pneumonia of left lower lobe due to infectious organism ([place])    - albuterol sulfate HFA (PROAIR HFA) 108 (90 Base) MCG/ACT inhaler; Inhale 2 puffs into the lungs every 6 hours as needed for Wheezing  Dispense: 1 Inhaler; Refill: 3  - cefdinir (OMNICEF) 300 MG capsule; Take 1 capsule by mouth 2 times daily for 10 days  Dispense: 20 capsule; Refill: 0  -LLL clear on auscultation RUL crackles heard. Will need extension of abx treatment.     2. Yeast infection    - fluconazole (DIFLUCAN) 150 MG tablet; Take 1 tablet by mouth daily for 3 doses  Dispense: 3 tablet; Refill: 0    3. Type 2 diabetes mellitus without complication, without long-term current use of insulin (HCC)  -Discussed how infection will affect her blood sugars and to be monitoring this.     4. Protein S deficiency ([place])    -Made an agreement that we would hold sending her to the ER if she comes back for an INR check in one week. Educated that the medications we are prescribing today will affect her INR and could be dangerous if we do not monitor this. If she gets any worse she will go to the ER. Husband and patient verbalized understanding.     5. Cold sore    - acyclovir (ZOVIRAX) 400 MG tablet; TAKE 5 TABLETS BY MOUTH ONCE A DAY FOR 5 DAYS  Dispense: 25 tablet; Refill: 3    Follow up if symptoms do not improve or worsen. If the patient becomes short of breath go straight to the ER or call 911.    No follow-ups on file.    An  electronic signature was used to authenticate this note.    --Dorise Hiss, APRN - CNP on 12/15/2018 at 10:39 AM

## 2018-12-22 ENCOUNTER — Encounter: Admit: 2018-12-22 | Discharge: 2018-12-22 | Payer: PRIVATE HEALTH INSURANCE | Primary: Family Medicine

## 2018-12-22 DIAGNOSIS — D6859 Other primary thrombophilia: Secondary | ICD-10-CM

## 2018-12-22 LAB — POCT PT/INR: INR,(POC): 8

## 2018-12-29 ENCOUNTER — Ambulatory Visit
Admit: 2018-12-29 | Discharge: 2018-12-29 | Payer: PRIVATE HEALTH INSURANCE | Attending: Family Medicine | Primary: Family Medicine

## 2018-12-29 DIAGNOSIS — Z6836 Body mass index (BMI) 36.0-36.9, adult: Secondary | ICD-10-CM

## 2018-12-29 LAB — POCT PT/INR: INR,(POC): 2.4

## 2018-12-29 MED ORDER — PHENTERMINE HCL 37.5 MG PO CAPS
37.5 | ORAL_CAPSULE | Freq: Every morning | ORAL | 0 refills | Status: DC
Start: 2018-12-29 — End: 2019-02-02

## 2018-12-29 NOTE — Patient Instructions (Signed)
Hypertension Lifestyle Interventions - What Good Does It Do?    Intervention Systolic BP Reduction Diastolic BP Reduction        DASH Diet Plus Sodium Restriction 11.5 5.7   Sodium Restriction (<1500mg/day) 7 3   DASH Diet 5.5 3   Weight Loss (Approx. 9 lbs.) 4.5 3.2   Exercise (150 min/wk) 4 3   Restrictions of Alcohol Consumption 3 2

## 2018-12-29 NOTE — Other (Signed)
inr at goal.  Same dose, rpt in 2 weeks.  Informed at office visit.

## 2018-12-29 NOTE — Progress Notes (Signed)
Chief Complaint   Patient presents with   ??? Weight Management       HPI:  Jasmine Strickland is a 48 y.o. (DOB: 10-12-1971) here today to discuss medication for weight loss.  Patient has gained about 20-25lbs in the last 2 years. Patient is not working at this time and activity has decreased.  She feels this has contributed to her weight gain.  Does not often drink sugary beverages.  Sugars have been fairly well controlled.     Patient was previously diagnosed with pneumonia in December.  She will be due for repeat chest xray in next week.  Has finished abx.  Overall feeling better.      INR was normal today.  Patient informed to continue current dose and recheck in 2 weeks.  Likely was elevated due to abx.    HPI    Patient's medications, allergies, past medical, surgical, social and family histories were reviewed and updated asappropriate.    ROS:  Review of Systems   Constitutional: Negative for activity change, appetite change and fatigue.   HENT: Negative for congestion, ear discharge, ear pain, hearing loss and sneezing.    Eyes: Negative for visual disturbance.   Respiratory: Negative for cough, chest tightness and shortness of breath.    Cardiovascular: Negative for chest pain and palpitations.   Gastrointestinal: Negative for abdominal pain, blood in stool, constipation and diarrhea.   Genitourinary: Negative for difficulty urinating and flank pain.   Skin: Negative for color change and rash.   Neurological: Negative for dizziness.   Psychiatric/Behavioral: Negative for sleep disturbance.           Prior to Visit Medications    Medication Sig Taking? Authorizing Provider   phentermine (ADIPEX-P) 37.5 MG capsule Take 1 capsule by mouth every morning for 30 days. Yes Margaretmary Dys, MD   albuterol sulfate HFA (PROAIR HFA) 108 (90 Base) MCG/ACT inhaler Inhale 2 puffs into the lungs every 6 hours as needed for Wheezing Yes Dorise Hiss, APRN - CNP   acyclovir (ZOVIRAX) 400 MG tablet TAKE 5 TABLETS BY MOUTH  ONCE A DAY FOR 5 DAYS Yes Dorise Hiss, APRN - CNP   ondansetron (ZOFRAN) 4 MG tablet TAKE ONE TABLET BY MOUTH EVERY EIGHT HOURS AS NEEDED FOR NAUSEA Yes Dorise Hiss, APRN - CNP   propranolol (INDERAL LA) 60 MG extended release capsule TAKE ONE CAPSULE BY MOUTH EVERY DAY Yes Margaretmary Dys, MD   warfarin (COUMADIN) 5 MG tablet TAKE UP TO 2 TABLETS DAILY AS DIRECTED Yes Margaretmary Dys, MD   thyroid (NP THYROID) 30 MG tablet TAKE ONE TABLET BY MOUTH DAILY Yes Dorise Hiss, APRN - CNP   methocarbamol (ROBAXIN) 500 MG tablet TAKE ONE TABLET THREE TIMES A DAY Yes Dorise Hiss, APRN - CNP   lipase-protease-amylase (CREON) 24000-76000 units delayed release capsule 3 capsules with each meal and 1 capsule with each snack (do not exceed 12 capsules a day) Yes Cyndi Bender, MD   warfarin (COUMADIN) 5 MG tablet Take daily as directed Yes Margaretmary Dys, MD   warfarin (COUMADIN) 1 MG tablet   Take 5 mg by mouth daily at 1800 Pt takes 7.5 mg every day except Monday and Thursday she takes 5 mg. Yes Historical Provider, MD       Allergies   Allergen Reactions   ??? Phenergan [Promethazine Hcl] Anaphylaxis   ??? Azithromycin Swelling   ??? Gluten Meal    ??? Lactose Diarrhea   ???  Orange Syrup    ??? Tomato    ??? Topamax [Topiramate] Other (See Comments)     Chest pain   ??? Codeine Nausea And Vomiting   ??? Hydrocodone Nausea And Vomiting       OBJECTIVE:    BP 118/78    Pulse 82    Ht 5\' 2"  (1.575 m)    Wt 198 lb 6.4 oz (90 kg)    SpO2 98%    BMI 36.29 kg/m??     BP Readings from Last 2 Encounters:   12/29/18 118/78   12/15/18 126/80       Wt Readings from Last 3 Encounters:   12/29/18 198 lb 6.4 oz (90 kg)   12/15/18 196 lb (88.9 kg)   09/10/18 194 lb (88 kg)       Physical Exam  Constitutional:       Appearance: She is well-developed.   HENT:      Head: Normocephalic and atraumatic.      Right Ear: Hearing normal.      Left Ear: Hearing normal.   Eyes:      Conjunctiva/sclera: Conjunctivae normal.   Neck:      Trachea: No  tracheal deviation.   Cardiovascular:      Rate and Rhythm: Normal rate and regular rhythm.   Pulmonary:      Effort: Pulmonary effort is normal.      Breath sounds: Normal breath sounds.   Abdominal:      Palpations: Abdomen is soft.      Tenderness: There is no tenderness.   Skin:     General: Skin is warm and dry.   Neurological:      Mental Status: She is alert and oriented to person, place, and time.   Psychiatric:         Mood and Affect: Mood normal.         Behavior: Behavior normal.           ASSESSMENT/PLAN:     1. Class 2 severe obesity with serious comorbidity and body mass index (BMI) of 36.0 to 36.9 in adult, unspecified obesity type [facility])  Discussed options.  Has used meds as below in the past.  Given diet and exercise info.  F/u in 1 mo.  Discussed that meds, once gone, weight tends to return.    - phentermine (ADIPEX-P) 37.5 MG capsule; Take 1 capsule by mouth every morning for 30 days.  Dispense: 30 capsule; Refill: 0    2. Protein S deficiency ([place])  inr at goal.  Cont meds.  Rpt in 2 weeks  - POCT INR    3. Pneumonia of left lower lobe due to infectious organism [facility])  Improved. Order for rpt cxr given.  To be done in 1-2 weeks.   - XR CHEST STANDARD (2 VW); Future          Scribe attestation: I, Alanson Aly, am scribing for and in the presence of Margaretmary Dys, MD. Electronically signed by Alanson Aly CMA on 12/29/2018 at 12:25 PM      Provider attestation: I, Margaretmary Dys, MD, personally performed the services scribed by the user listed above in my presence, and it is both accurate and complete. I agree with the ROS and Past Histories independently gathered by the clinical support staff and the remaining scribed note accurately describes my personal service to the patient.    @SIGNATURE @    12/29/2018  12:26 PM

## 2019-01-13 ENCOUNTER — Encounter: Admit: 2019-01-13 | Discharge: 2019-01-13 | Payer: PRIVATE HEALTH INSURANCE | Primary: Family Medicine

## 2019-01-13 DIAGNOSIS — D6859 Other primary thrombophilia: Secondary | ICD-10-CM

## 2019-01-13 LAB — POCT PT/INR: INR,(POC): 1.9

## 2019-01-13 NOTE — Other (Signed)
cxr neg.  No evidence of pneumonia.

## 2019-01-20 ENCOUNTER — Encounter: Admit: 2019-01-20 | Discharge: 2019-01-20 | Payer: PRIVATE HEALTH INSURANCE | Primary: Family Medicine

## 2019-01-20 DIAGNOSIS — D6859 Other primary thrombophilia: Secondary | ICD-10-CM

## 2019-01-20 LAB — POCT PT/INR: INR,(POC): 1.9

## 2019-01-27 ENCOUNTER — Encounter: Attending: Family Medicine | Primary: Family Medicine

## 2019-02-02 ENCOUNTER — Ambulatory Visit
Admit: 2019-02-02 | Discharge: 2019-02-02 | Payer: PRIVATE HEALTH INSURANCE | Attending: Family Medicine | Primary: Family Medicine

## 2019-02-02 DIAGNOSIS — Z6835 Body mass index (BMI) 35.0-35.9, adult: Secondary | ICD-10-CM

## 2019-02-02 MED ORDER — PHENTERMINE HCL 37.5 MG PO CAPS
37.5 MG | ORAL_CAPSULE | Freq: Every morning | ORAL | 0 refills | Status: AC
Start: 2019-02-02 — End: 2019-03-04

## 2019-02-02 NOTE — Progress Notes (Signed)
Chief Complaint   Patient presents with   ??? Weight Management       HPI:  Jasmine Strickland is a 48 y.o. (DOB: January 17, 1971) here today   for follow up on weight management and Adipex.   She has lost 4lbs since last visit.  She did try the Keto diet but did not feel that she lost any weight so she stopped.  She is tolerating medication well.  Discussed importance of diet.    Also tried low calorie (under 1200 kcal/d) diet.  Does often eat late at night.  Recent travel, but states did not eat fast food.   HPI    Patient's medications, allergies, past medical, surgical, social and family histories were reviewed and updated as appropriate.    ROS:  Review of Systems   Constitutional: Negative for activity change, appetite change, fatigue and unexpected weight change.   HENT: Negative for congestion, ear discharge, ear pain, hearing loss and sneezing.    Eyes: Negative for visual disturbance.   Respiratory: Negative for cough, chest tightness and shortness of breath.    Cardiovascular: Negative for chest pain and palpitations.   Gastrointestinal: Negative for abdominal pain, blood in stool, constipation and diarrhea.   Genitourinary: Negative for difficulty urinating and flank pain.   Skin: Negative for color change and rash.   Neurological: Negative for dizziness.   Psychiatric/Behavioral: Negative for sleep disturbance.           Hemoglobin A1C (%)   Date Value   09/10/2018 5.8     Microscopic Examination (no units)   Date Value   04/24/2018 Not Indicated     LDL Calculated (mg/dL)   Date Value   09/10/2018 140 (H)       Past Medical History:   Diagnosis Date   ??? Asthma    ??? Diabetes mellitus ([place])    ??? Fibromyalgia    ??? GERD (gastroesophageal reflux disease)    ??? Hypothyroidism    ??? IBS (irritable bowel syndrome)    ??? Migraine    ??? Pleurisy     multiple episodes "several yrs ago"   ??? Protein S deficiency ([place])    ??? Pulmonary embolism ([place])    ??? Type II or unspecified type diabetes mellitus without mention of  complication, not stated as uncontrolled        No family history on file.    Social History     Socioeconomic History   ??? Marital status: Married     Spouse name: Not on file   ??? Number of children: Not on file   ??? Years of education: Not on file   ??? Highest education level: Not on file   Occupational History   ??? Not on file   Social Needs   ??? Financial resource strain: Not on file   ??? Food insecurity:     Worry: Not on file     Inability: Not on file   ??? Transportation needs:     Medical: Not on file     Non-medical: Not on file   Tobacco Use   ??? Smoking status: Never Smoker   ??? Smokeless tobacco: Never Used   Substance and Sexual Activity   ??? Alcohol use: No   ??? Drug use: No   ??? Sexual activity: Yes     Partners: Male   Lifestyle   ??? Physical activity:     Days per week: Not on file     Minutes per  session: Not on file   ??? Stress: Not on file   Relationships   ??? Social connections:     Talks on phone: Not on file     Gets together: Not on file     Attends religious service: Not on file     Active member of club or organization: Not on file     Attends meetings of clubs or organizations: Not on file     Relationship status: Not on file   ??? Intimate partner violence:     Fear of current or ex partner: Not on file     Emotionally abused: Not on file     Physically abused: Not on file     Forced sexual activity: Not on file   Other Topics Concern   ??? Not on file   Social History Narrative   ??? Not on file       Prior to Visit Medications    Medication Sig Taking? Authorizing Provider   phentermine (ADIPEX-P) 37.5 MG capsule Take 1 capsule by mouth every morning for 30 days. Yes Margaretmary Dys, MD   albuterol sulfate HFA (PROAIR HFA) 108 (90 Base) MCG/ACT inhaler Inhale 2 puffs into the lungs every 6 hours as needed for Wheezing Yes Dorise Hiss, APRN - CNP   acyclovir (ZOVIRAX) 400 MG tablet TAKE 5 TABLETS BY MOUTH ONCE A DAY FOR 5 DAYS Yes Dorise Hiss, APRN - CNP   ondansetron (ZOFRAN) 4 MG tablet TAKE ONE  TABLET BY MOUTH EVERY EIGHT HOURS AS NEEDED FOR NAUSEA Yes Dorise Hiss, APRN - CNP   propranolol (INDERAL LA) 60 MG extended release capsule TAKE ONE CAPSULE BY MOUTH EVERY DAY Yes Margaretmary Dys, MD   warfarin (COUMADIN) 5 MG tablet TAKE UP TO 2 TABLETS DAILY AS DIRECTED Yes Margaretmary Dys, MD   thyroid (NP THYROID) 30 MG tablet TAKE ONE TABLET BY MOUTH DAILY Yes Dorise Hiss, APRN - CNP   methocarbamol (ROBAXIN) 500 MG tablet TAKE ONE TABLET THREE TIMES A DAY Yes Dorise Hiss, APRN - CNP   lipase-protease-amylase (CREON) 24000-76000 units delayed release capsule 3 capsules with each meal and 1 capsule with each snack (do not exceed 12 capsules a day) Yes Cyndi Bender, MD   warfarin (COUMADIN) 5 MG tablet Take daily as directed Yes Margaretmary Dys, MD   warfarin (COUMADIN) 1 MG tablet   Take 5 mg by mouth daily at 1800 Pt takes 7.5 mg every day except Monday and Thursday she takes 5 mg. Yes Historical Provider, MD       Allergies   Allergen Reactions   ??? Phenergan [Promethazine Hcl] Anaphylaxis   ??? Azithromycin Swelling   ??? Gluten Meal    ??? Lactose Diarrhea   ??? Orange Syrup    ??? Tomato    ??? Topamax [Topiramate] Other (See Comments)     Chest pain   ??? Codeine Nausea And Vomiting   ??? Hydrocodone Nausea And Vomiting       OBJECTIVE:    BP 112/86    Pulse 96    Ht 5\' 2"  (1.575 m)    Wt 194 lb 6.4 oz (88.2 kg)    SpO2 98%    BMI 35.56 kg/m??     BP Readings from Last 2 Encounters:   02/02/19 112/86   12/29/18 118/78       Wt Readings from Last 3 Encounters:   02/02/19 194 lb 6.4 oz (88.2 kg)   12/29/18  198 lb 6.4 oz (90 kg)   12/15/18 196 lb (88.9 kg)       Physical Exam  Constitutional:       Appearance: She is well-developed.   HENT:      Head: Normocephalic and atraumatic.      Right Ear: Hearing normal.      Left Ear: Hearing normal.   Eyes:      Conjunctiva/sclera: Conjunctivae normal.   Neck:      Trachea: No tracheal deviation.   Cardiovascular:      Rate and Rhythm: Normal rate and regular rhythm.    Pulmonary:      Effort: Pulmonary effort is normal.      Breath sounds: Normal breath sounds.   Skin:     General: Skin is warm and dry.   Neurological:      Mental Status: She is alert and oriented to person, place, and time.   Psychiatric:         Mood and Affect: Mood normal.         Behavior: Behavior normal.           ASSESSMENT/PLAN:    1. Class 2 severe obesity with serious comorbidity and body mass index (BMI) of 35.0 to 35.9 in adult, unspecified obesity type ([place])  Discussed diet.  Discussed intermittent fasting.  Avoid eating late at night.  Discussed consistent veggie intake.  Refill meds.  tol well.  F/u in 1 mo  - phentermine (ADIPEX-P) 37.5 MG capsule; Take 1 capsule by mouth every morning for 30 days.  Dispense: 30 capsule; Refill: 0    2. Protein S deficiency ([place])  inr at goal.  Same dose.  Rpt in 1 mo  - POCT INR          Scribe attestation: I, Alanson Aly CMA, am scribing for and in the presence of Margaretmary Dys, MD. Electronically signed by Alanson Aly CMA on 02/02/2019 at 4:42 PM      Provider attestation: I, Margaretmary Dys, MD, personally performed the services scribed by the user listed above in my presence, and it is both accurate and complete. I agree with the ROS and Past Histories independently gathered by the clinical support staff and the remaining scribed note accurately describes my personal service to the patient.          02/02/2019  4:43 PM

## 2019-04-20 MED ORDER — THYROID 30 MG PO TABS
30 MG | ORAL_TABLET | ORAL | 0 refills | Status: DC
Start: 2019-04-20 — End: 2019-06-08

## 2019-04-20 NOTE — Telephone Encounter (Signed)
Last office visit 02/02/19, advised to follow-up 4wk, next appointment 0.

## 2019-05-12 ENCOUNTER — Encounter: Admit: 2019-05-12 | Discharge: 2019-05-12 | Payer: PRIVATE HEALTH INSURANCE | Primary: Family Medicine

## 2019-05-12 DIAGNOSIS — D6859 Other primary thrombophilia: Secondary | ICD-10-CM

## 2019-05-12 LAB — POCT PT/INR: INR,(POC): 3.4

## 2019-05-12 NOTE — Progress Notes (Signed)
Patient states she has taken several Excedrin Migraine pills in the last couple of days.  Just wanted Dr. Megan Salon to know in case this would alter her results.

## 2019-05-12 NOTE — Other (Signed)
Go to 7.5mg  m,f.  5mg  all others.  Rpt in 2 weeks

## 2019-05-17 ENCOUNTER — Encounter

## 2019-05-17 MED ORDER — PROPRANOLOL HCL ER 60 MG PO CP24
60 MG | ORAL_CAPSULE | ORAL | 3 refills | Status: DC
Start: 2019-05-17 — End: 2019-09-27

## 2019-05-17 NOTE — Telephone Encounter (Signed)
Last office visit 02/02/2019, advised to follow-up 4 weeks, next appointment none.

## 2019-06-08 MED ORDER — THYROID 30 MG PO TABS
30 MG | ORAL_TABLET | ORAL | 0 refills | Status: DC
Start: 2019-06-08 — End: 2019-07-12

## 2019-06-08 NOTE — Telephone Encounter (Signed)
Last office visit 02/02/2019, advised to follow-up 4 weeks, next appointment none.

## 2019-06-28 MED ORDER — ONDANSETRON HCL 4 MG PO TABS
4 MG | ORAL_TABLET | ORAL | 0 refills | Status: DC
Start: 2019-06-28 — End: 2019-09-27

## 2019-06-28 NOTE — Telephone Encounter (Signed)
Last office visit 02/02/2019, advised to follow-up 4 weeks, next appointment none.

## 2019-07-12 ENCOUNTER — Encounter

## 2019-07-12 MED ORDER — THYROID 30 MG PO TABS
30 MG | ORAL_TABLET | ORAL | 3 refills | Status: DC
Start: 2019-07-12 — End: 2019-12-06

## 2019-07-12 NOTE — Telephone Encounter (Signed)
Last office visit 02/02/2019, advised to follow-up 4 weeks, next appointment none.

## 2019-09-01 ENCOUNTER — Ambulatory Visit
Admit: 2019-09-01 | Discharge: 2019-09-01 | Payer: PRIVATE HEALTH INSURANCE | Attending: Family | Primary: Family Medicine

## 2019-09-01 DIAGNOSIS — D689 Coagulation defect, unspecified: Secondary | ICD-10-CM

## 2019-09-01 LAB — POCT PT/INR: INR,(POC): 1.8

## 2019-09-01 NOTE — Progress Notes (Signed)
Chief Complaint   Patient presents with   ??? Memory Loss     dizziness       BP (!) 140/86    Pulse 78    Temp 97.2 ??F (36.2 ??C)    Wt 197 lb 3.2 oz (89.4 kg)    SpO2 97%    Breastfeeding No    BMI 36.07 kg/m??     HPI:  Jasmine Strickland is a 48 y.o. (DOB: [birth year]) here today   for   HPI    Word Finding Problems: She is oriented. She cant find the words she wants to stay. "she needs to take out the trash but said groceries" She has protein S deficiency. More recent memory than past memory. Denies slurred speech. She has increased nausea, dizziness, and ringing in her ears but this is a chronic issue. She has had this before and had a full neurology/MS workup at [facility] per patient.     She did not go to specialist (Neurologist) prior recommendation from this occurring before. She thinks this is worse than the last episode.     INR: 1.8 she has been taking 5 mg daily which is not how it was prescribed. &.5 M F and 5 MG all other follow up in two weeks.     Patient's medications, allergies, past medical, surgical, social and family histories were reviewed and updated asappropriate.    ROS:  Review of Systems   Constitutional: Negative for activity change, appetite change, chills, diaphoresis, fatigue, fever and unexpected weight change.   HENT: Negative for congestion, ear discharge, ear pain, hearing loss, nosebleeds, postnasal drip, rhinorrhea, sinus pressure, sinus pain, sneezing, sore throat, tinnitus, trouble swallowing and voice change.    Eyes: Negative for photophobia, pain, discharge, redness and itching.   Respiratory: Negative for cough, choking, chest tightness, shortness of breath, wheezing and stridor.    Cardiovascular: Negative for chest pain, palpitations and leg swelling.   Gastrointestinal: Negative for abdominal pain, blood in stool, constipation, diarrhea, nausea and vomiting.   Endocrine: Negative for cold intolerance, heat intolerance, polydipsia and polyuria.   Genitourinary: Negative  for difficulty urinating, dysuria, enuresis, flank pain, frequency, hematuria and urgency.   Musculoskeletal: Negative for back pain, gait problem, joint swelling, neck pain and neck stiffness.   Skin: Negative for color change, pallor, rash and wound.   Allergic/Immunologic: Negative for environmental allergies and food allergies.   Neurological: Negative for dizziness, tremors, syncope, speech difficulty, weakness, light-headedness, numbness and headaches.   Hematological: Negative for adenopathy. Does not bruise/bleed easily.   Psychiatric/Behavioral: Negative for agitation, behavioral problems, confusion, decreased concentration, dysphoric mood, hallucinations, self-injury, sleep disturbance and suicidal ideas. The patient is not nervous/anxious and is not hyperactive.            Prior to Visit Medications    Medication Sig Taking? Authorizing Provider   thyroid (NP THYROID) 30 MG tablet TAKE ONE TABLET BY MOUTH DAILY Yes Talmadge Chad, APRN - CNP   ondansetron (ZOFRAN) 4 MG tablet TAKE ONE TABLET BY MOUTH EVERY EIGHT HOURS AS NEEDED FOR NAUSEA Yes Talmadge Chad, APRN - CNP   propranolol (INDERAL LA) 60 MG extended release capsule TAKE ONE CAPSULE BY MOUTH EVERY DAY Yes Talmadge Chad, APRN - CNP   albuterol sulfate HFA (PROAIR HFA) 108 (90 Base) MCG/ACT inhaler Inhale 2 puffs into the lungs every 6 hours as needed for Wheezing Yes Talmadge Chad, APRN - CNP   acyclovir (ZOVIRAX) 400 MG tablet  TAKE 5 TABLETS BY MOUTH ONCE A DAY FOR 5 DAYS Yes Talmadge Chad, APRN - CNP   warfarin (COUMADIN) 5 MG tablet TAKE UP TO 2 TABLETS DAILY AS DIRECTED Yes Margaretmary Dys, MD   methocarbamol (ROBAXIN) 500 MG tablet TAKE ONE TABLET THREE TIMES A DAY Yes Talmadge Chad, APRN - CNP   warfarin (COUMADIN) 1 MG tablet   Take 5 mg by mouth daily at 1800 Pt takes 7.5 mg every day except Monday and Thursday she takes 5 mg.  Historical Provider, MD       Allergies   Allergen Reactions   ??? Phenergan [Promethazine Hcl] Anaphylaxis   ???  Azithromycin Swelling   ??? Gluten Meal    ??? Lactose Diarrhea   ??? Orange Syrup    ??? Tomato    ??? Topamax [Topiramate] Other (See Comments)     Chest pain   ??? Codeine Nausea And Vomiting   ??? Hydrocodone Nausea And Vomiting       OBJECTIVE:      BP Readings from Last 2 Encounters:   09/01/19 (!) 140/86   02/02/19 112/86       Wt Readings from Last 3 Encounters:   09/01/19 197 lb 3.2 oz (89.4 kg)   02/02/19 194 lb 6.4 oz (88.2 kg)   12/29/18 198 lb 6.4 oz (90 kg)       Physical Exam  Vitals signs reviewed.   Constitutional:       General: She is not in acute distress.     Appearance: Normal appearance. She is well-developed. She is not ill-appearing.   HENT:      Head: Normocephalic and atraumatic.      Right Ear: Hearing normal.      Left Ear: Hearing normal.      Nose:      Right Sinus: No maxillary sinus tenderness or frontal sinus tenderness.      Left Sinus: No maxillary sinus tenderness or frontal sinus tenderness.   Neck:      Musculoskeletal: Normal range of motion.      Thyroid: No thyromegaly.      Vascular: No JVD.      Trachea: No tracheal deviation.   Cardiovascular:      Rate and Rhythm: Normal rate and regular rhythm.      Heart sounds: Normal heart sounds. No murmur. No friction rub.   Pulmonary:      Effort: Pulmonary effort is normal. No respiratory distress.      Breath sounds: Normal breath sounds. No stridor. No decreased breath sounds, wheezing, rhonchi or rales.   Musculoskeletal: Normal range of motion.         General: No tenderness.   Lymphadenopathy:      Cervical: No cervical adenopathy.   Skin:     General: Skin is warm and dry.      Capillary Refill: Capillary refill takes less than 2 seconds.      Findings: No rash.   Neurological:      General: No focal deficit present.      Mental Status: She is alert and oriented to person, place, and time.      GCS: GCS eye subscore is 4. GCS verbal subscore is 5. GCS motor subscore is 6.      Cranial Nerves: Cranial nerves are intact. No cranial nerve  deficit.      Sensory: Sensation is intact. No sensory deficit.      Motor: Motor function is intact.  No weakness.      Coordination: Coordination is intact. Coordination normal.      Gait: Gait is intact. Gait normal.      Deep Tendon Reflexes: Reflexes normal.   Psychiatric:         Attention and Perception: Attention and perception normal.         Mood and Affect: Mood normal.         Speech: Speech normal.         Behavior: Behavior normal. Behavior is cooperative.         Thought Content: Thought content normal.         Cognition and Memory: Cognition normal.         Judgment: Judgment normal.           ASSESSMENT/PLAN:    1. 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 Protein S deficiency ([place])    - POCT INR  - AFL - Milda Smart, MD, Neurology, [place]  - CBC Auto Differential  - TSH without Reflex  - T4, Free    3. Memory loss    - AFL - Milda Smart, MD, Neurology, [place]  - CBC Auto Differential  - Comprehensive Metabolic Panel  - Lipid Panel  - TSH without Reflex  - T4, Free  - Vitamin B12  - Vitamin D 25 Hydroxy    4. Pure hypercholesterolemia    - CBC Auto Differential  - Lipid Panel    5. Fibromyalgia    - CBC Auto Differential  - Lipid Panel  - TSH without Reflex  - T4, Free  - Vitamin B12  - Vitamin D 25 Hydroxy    6. Type 2 diabetes mellitus without complication, without long-term current use of insulin ([facility])    - CBC Auto Differential  - Comprehensive Metabolic Panel  - Hemoglobin A1C    7. Hypothyroidism, unspecified type    - CBC Auto Differential  - TSH without Reflex  - T4, Free    Follow up if symptoms do not improve or worsen. If the patient becomes short of breath go straight to the ER or call 911.    Will follow up with lab results.

## 2019-09-01 NOTE — Telephone Encounter (Signed)
Pt states she hasn't been driving for some time now. Will have spouse drive pt. Will go to ED or [facility]. Please do not respond to the triage nurse through this encounter.  Any subsequent communication should be directly with the patient.

## 2019-09-01 NOTE — Telephone Encounter (Signed)
Reason for Disposition  ??? [1] Worsening confusion AND [2] gradual onset (days to weeks)    Answer Assessment - Initial Assessment Questions  1. MAIN CONCERN OR SYMPTOM:  "What is your main concern right now?" "What questions do you have?" "What's the main symptom you're worried about?" (e.g., confusion, memory loss)      Pt keeps forgetting things and saying the wrong words   2. ONSET:  "When did the symptom start (or worsen)?" (minutes, hours, days, weeks)     In the last 2 or 3 months and has gotten worst, pt states she was at hospital a couple months ago and was told she could of possibly had a TIA or occular migraine   3. BETTER-SAME-WORSE: "Are you getting better, staying the same, or getting worse compared to the day you were discharged?"      Pts memory is getting worst   4.  DIAGNOSIS: "Was patient diagnosed with dementia by a doctor?" If yes, "When?" (e.g., days, months, years ago)      No  5.  MEDICATION: "Has there been any change in medications recently?" (e.g., narcotics, antihistamines, benzodiazepines, etc.)      Pt denies   6.  OTHER SYMPTOMS: "Are there any other symptoms?" (e.g., fever, cough, pain, falling)      Pt denies   7. SUPPORT: Document living circumstances and support (e.g., family, nursing home)      Lives with family    Protocols used: DEMENTIA SYMPTOMS AND QUESTIONS-ADULT-AH

## 2019-09-02 ENCOUNTER — Encounter

## 2019-09-02 ENCOUNTER — Telehealth

## 2019-09-02 LAB — COMPREHENSIVE METABOLIC PANEL
ALT: 18 U/L (ref 10–40)
AST: 18 U/L (ref 15–37)
Albumin/Globulin Ratio: 1.7 (ref 1.1–2.2)
Albumin: 4.3 g/dL (ref 3.4–5.0)
Alkaline Phosphatase: 97 U/L (ref 40–129)
Anion Gap: 15 (ref 3–16)
BUN: 11 mg/dL (ref 7–20)
CO2: 21 mmol/L (ref 21–32)
Calcium: 9.2 mg/dL (ref 8.3–10.6)
Chloride: 105 mmol/L (ref 99–110)
Creatinine: 0.7 mg/dL (ref 0.6–1.1)
GFR African American: >60 (ref >60)
GFR Non-African American: >60 (ref >60)
Globulin: 2.6 g/dL
Potassium: 5.3 mmol/L — ABNORMAL HIGH (ref 3.5–5.1)
Sodium: 141 mmol/L (ref 136–145)
Total Bilirubin: 0.5 mg/dL (ref 0.0–1.0)
Total Protein: 6.9 g/dL (ref 6.4–8.2)

## 2019-09-02 LAB — CBC WITH AUTO DIFFERENTIAL
Basophils %: 0.6 %
Basophils Absolute: 0 10*3/uL (ref 0.0–0.2)
Eosinophils %: 1.1 %
Eosinophils Absolute: 0.1 10*3/uL (ref 0.0–0.6)
Hematocrit: 43.2 % (ref 36.0–48.0)
Hemoglobin: 14.1 g/dL (ref 12.0–16.0)
Lymphocytes %: 34.2 %
Lymphocytes Absolute: 1.8 K/uL (ref 1.0–5.1)
MCH: 30.7 pg (ref 26.0–34.0)
MCHC: 32.7 g/dL (ref 31.0–36.0)
MCV: 93.6 fL (ref 80.0–100.0)
MPV: 10.7 fL — ABNORMAL HIGH (ref 5.0–10.5)
Monocytes %: 6 %
Monocytes Absolute: 0.3 10*3/uL (ref 0.0–1.3)
Neutrophils %: 58.1 %
Neutrophils Absolute: 3.1 10*3/uL (ref 1.7–7.7)
Platelets: 271 10*3/uL (ref 135–450)
RBC: 4.61 M/uL (ref 4.00–5.20)
RDW: 13.8 % (ref 12.4–15.4)
WBC: 5.3 K/uL (ref 4.0–11.0)

## 2019-09-02 LAB — VITAMIN D 25 HYDROXY: Vit D, 25-Hydroxy: 26.3 ng/mL — ABNORMAL LOW (ref >=30)

## 2019-09-02 LAB — LIPID PANEL
Cholesterol, Total: 194 mg/dL (ref 0–199)
LDL Calculated: 114 mg/dL — ABNORMAL HIGH (ref <100)
Triglycerides: 171 mg/dL — ABNORMAL HIGH (ref 0–150)
VLDL Cholesterol Calculated: 34 mg/dL

## 2019-09-02 LAB — HEMOGLOBIN A1C
Hemoglobin A1C: 6.4 %
eAG: 137 mg/dL

## 2019-09-02 LAB — VITAMIN B12: Vitamin B-12: 395 pg/mL (ref 211–911)

## 2019-09-02 LAB — TSH: TSH: 2.81 u[IU]/mL (ref 0.27–4.20)

## 2019-09-02 LAB — T4, FREE: T4 Free: 1.1 ng/dL (ref 0.9–1.8)

## 2019-09-02 NOTE — Telephone Encounter (Signed)
-----   Message from [employer] sent at 09/02/2019 12:06 PM EDT -----  Subject: Message to Provider    QUESTIONS  Information for Provider? Patient was given a referral to [facility] on 9/16 and they will not accept her [employer] insurance.   Patient would like to have referral sent to another practice.  ---------------------------------------------------------------------------  --------------  Rod Can INFO  What is the best way for the office to contact you? OK to leave message on   voicemail  Preferred Call Back Phone Number? [phone]  ---------------------------------------------------------------------------  --------------  SCRIPT ANSWERS  Relationship to Patient? Self

## 2019-09-02 NOTE — Telephone Encounter (Signed)
Ok thanks for update

## 2019-09-02 NOTE — Telephone Encounter (Signed)
Changed referral to Dr Marlene Lard in [place].

## 2019-09-17 ENCOUNTER — Telehealth

## 2019-09-17 NOTE — Telephone Encounter (Signed)
Ok for referral

## 2019-09-17 NOTE — Telephone Encounter (Signed)
Please advise

## 2019-09-17 NOTE — Telephone Encounter (Signed)
-----   Message from Beverly Gust. sent at 09/17/2019  8:53 AM EDT -----  Subject: Referral Request    QUESTIONS   Reason for referral request? Neurology   Has the physician seen you for this condition before? No   Preferred Specialist (if applicable)? Nicanor Bake  Do you already have an appointment scheduled? Yes  Select Scheduled Date? 2019-09-17  Select Scheduled Physician? Outside Physician - Dr. Stephanie Acre  Additional Information for Provider? [facility] [address]   [place] [id number] [phone]  ---------------------------------------------------------------------------  --------------  Rod Can INFO  What is the best way for the office to contact you? OK to leave message on   voicemail   OK to respond with secure message via MyChart portal (only for patients   who have registered MyChart account)  Preferred Call Back Phone Number? [phone]

## 2019-09-17 NOTE — Telephone Encounter (Signed)
Pt informed, referral faxed to [phone]

## 2019-09-24 ENCOUNTER — Encounter

## 2019-09-27 MED ORDER — PROPRANOLOL HCL ER 60 MG PO CP24
60 MG | ORAL_CAPSULE | ORAL | 3 refills | Status: DC
Start: 2019-09-27 — End: 2020-02-15

## 2019-09-27 MED ORDER — ONDANSETRON HCL 4 MG PO TABS
4 MG | ORAL_TABLET | ORAL | 0 refills | Status: DC
Start: 2019-09-27 — End: 2019-12-03

## 2019-09-27 NOTE — Telephone Encounter (Signed)
Last office visit 09/01/19, advised to follow-up 0, next appointment 0.

## 2019-09-27 NOTE — Telephone Encounter (Signed)
Last office visit 09/01/2019, advised to follow-up 0, next appointment 0.

## 2019-10-15 ENCOUNTER — Encounter

## 2019-10-15 ENCOUNTER — Encounter: Admit: 2019-10-15 | Discharge: 2019-10-15 | Payer: PRIVATE HEALTH INSURANCE | Primary: Family Medicine

## 2019-10-15 DIAGNOSIS — D6859 Other primary thrombophilia: Secondary | ICD-10-CM

## 2019-10-15 LAB — POCT PT/INR: INR,(POC): 1.7

## 2019-10-15 MED ORDER — WARFARIN SODIUM 5 MG PO TABS
5 MG | ORAL_TABLET | ORAL | 5 refills | Status: DC
Start: 2019-10-15 — End: 2020-07-07

## 2019-10-15 MED ORDER — METHOCARBAMOL 500 MG PO TABS
500 MG | ORAL_TABLET | ORAL | 3 refills | Status: DC
Start: 2019-10-15 — End: 2020-08-22

## 2019-10-15 NOTE — Telephone Encounter (Signed)
Last office visit 09/01/2019, advised to follow-up none, next appointment none.

## 2019-12-03 ENCOUNTER — Encounter: Admit: 2019-12-03 | Discharge: 2019-12-03 | Payer: PRIVATE HEALTH INSURANCE | Primary: Family Medicine

## 2019-12-03 ENCOUNTER — Encounter

## 2019-12-03 DIAGNOSIS — D6859 Other primary thrombophilia: Secondary | ICD-10-CM

## 2019-12-03 LAB — POCT PT/INR: INR,(POC): 3.1

## 2019-12-03 MED ORDER — ONDANSETRON HCL 4 MG PO TABS
4 MG | ORAL_TABLET | ORAL | 0 refills | Status: DC
Start: 2019-12-03 — End: 2020-01-13

## 2019-12-03 NOTE — Telephone Encounter (Signed)
Last office visit 09/01/2019, advised to follow-up none, next appointment none.

## 2019-12-06 MED ORDER — THYROID 30 MG PO TABS
30 MG | ORAL_TABLET | ORAL | 3 refills | Status: DC
Start: 2019-12-06 — End: 2020-07-07

## 2019-12-06 NOTE — Telephone Encounter (Signed)
Last office visit 09/01/2019, advised to follow-up 0, next appointment 0.

## 2020-01-13 ENCOUNTER — Encounter

## 2020-01-13 MED ORDER — ACYCLOVIR 400 MG PO TABS
400 MG | ORAL_TABLET | ORAL | 3 refills | Status: DC
Start: 2020-01-13 — End: 2020-08-22

## 2020-01-13 MED ORDER — ONDANSETRON HCL 4 MG PO TABS
4 MG | ORAL_TABLET | ORAL | 0 refills | Status: DC
Start: 2020-01-13 — End: 2020-02-15

## 2020-01-13 MED ORDER — ALBUTEROL SULFATE HFA 108 (90 BASE) MCG/ACT IN AERS
108 (90 Base) MCG/ACT | RESPIRATORY_TRACT | 3 refills | Status: DC
Start: 2020-01-13 — End: 2020-08-22

## 2020-01-13 NOTE — Telephone Encounter (Signed)
Last office visit 09/01/2019, advised to follow-up 0, next appointment 0.

## 2020-01-20 ENCOUNTER — Encounter: Admit: 2020-01-20 | Discharge: 2020-01-20 | Payer: PRIVATE HEALTH INSURANCE | Primary: Family Medicine

## 2020-01-20 DIAGNOSIS — D689 Coagulation defect, unspecified: Secondary | ICD-10-CM

## 2020-01-20 LAB — POCT PT/INR: INR,(POC): 1.7

## 2020-01-20 NOTE — Other (Signed)
Go to 5mg  m,w,f.  7.5mg  all others.  Rpt in 2 weeks

## 2020-02-03 ENCOUNTER — Encounter: Admit: 2020-02-03 | Discharge: 2020-02-03 | Payer: PRIVATE HEALTH INSURANCE | Primary: Family Medicine

## 2020-02-03 DIAGNOSIS — D6859 Other primary thrombophilia: Secondary | ICD-10-CM

## 2020-02-03 LAB — POCT PT/INR: INR,(POC): 2.7

## 2020-02-03 NOTE — Other (Signed)
INR at goal.  Continue current coumadin dose and repeat INR in 3 weeks

## 2020-02-15 ENCOUNTER — Encounter

## 2020-02-15 MED ORDER — ONDANSETRON HCL 4 MG PO TABS
4 MG | ORAL_TABLET | ORAL | 0 refills | Status: DC
Start: 2020-02-15 — End: 2020-03-21

## 2020-02-15 MED ORDER — PROPRANOLOL HCL ER 60 MG PO CP24
60 MG | ORAL_CAPSULE | ORAL | 3 refills | Status: DC
Start: 2020-02-15 — End: 2020-08-22

## 2020-02-15 NOTE — Telephone Encounter (Signed)
Last office visit 09/01/19, advised to follow-up 0, next appointment 0.

## 2020-03-21 MED ORDER — ONDANSETRON HCL 4 MG PO TABS
4 MG | ORAL_TABLET | ORAL | 0 refills | Status: DC
Start: 2020-03-21 — End: 2020-08-22

## 2020-07-07 ENCOUNTER — Encounter

## 2020-07-07 MED ORDER — THYROID 30 MG PO TABS
30 MG | ORAL_TABLET | ORAL | 0 refills | Status: DC
Start: 2020-07-07 — End: 2020-08-22

## 2020-07-07 MED ORDER — WARFARIN SODIUM 5 MG PO TABS
5 MG | ORAL_TABLET | ORAL | 0 refills | Status: DC
Start: 2020-07-07 — End: 2020-08-22

## 2020-07-07 NOTE — Telephone Encounter (Signed)
Last office visit 08/22/2019, advised to follow-up 0, next appointment 0. Letter mailed to make appt

## 2020-07-07 NOTE — Telephone Encounter (Signed)
Please call patient to schedule an appointment.

## 2020-08-22 ENCOUNTER — Ambulatory Visit
Admit: 2020-08-22 | Discharge: 2020-08-22 | Payer: PRIVATE HEALTH INSURANCE | Attending: Family Medicine | Primary: Family Medicine

## 2020-08-22 DIAGNOSIS — D689 Coagulation defect, unspecified: Secondary | ICD-10-CM

## 2020-08-22 LAB — POCT PT/INR: INR,(POC): 3

## 2020-08-22 MED ORDER — THYROID 30 MG PO TABS
30 MG | ORAL_TABLET | ORAL | 0 refills | Status: DC
Start: 2020-08-22 — End: 2020-09-28

## 2020-08-22 MED ORDER — PROPRANOLOL HCL ER 60 MG PO CP24
60 MG | ORAL_CAPSULE | Freq: Every day | ORAL | 3 refills | Status: DC
Start: 2020-08-22 — End: 2021-04-13

## 2020-08-22 MED ORDER — ONDANSETRON HCL 4 MG PO TABS
4 MG | ORAL_TABLET | Freq: Three times a day (TID) | ORAL | 1 refills | Status: DC | PRN
Start: 2020-08-22 — End: 2021-04-13

## 2020-08-22 MED ORDER — WARFARIN SODIUM 5 MG PO TABS
5 MG | ORAL_TABLET | ORAL | 5 refills | Status: DC
Start: 2020-08-22 — End: 2021-04-13

## 2020-08-22 MED ORDER — METHOCARBAMOL 500 MG PO TABS
500 MG | ORAL_TABLET | ORAL | 3 refills | Status: DC
Start: 2020-08-22 — End: 2021-04-13

## 2020-08-22 MED ORDER — ALBUTEROL SULFATE HFA 108 (90 BASE) MCG/ACT IN AERS
108 (90 Base) MCG/ACT | RESPIRATORY_TRACT | 3 refills | Status: DC
Start: 2020-08-22 — End: 2021-02-26

## 2020-08-22 NOTE — Other (Signed)
Addressed at office visit.  Cont current dose.

## 2020-08-22 NOTE — Progress Notes (Signed)
Chief Complaint   Patient presents with   ??? Diabetes       HPI:  Jasmine Strickland is a 49 y.o. (DOB: July 15, 1971) here today   for   Diabetes  She presents for her follow-up diabetic visit. She has type 2 diabetes mellitus. Hypoglycemia symptoms include headaches. Pertinent negatives for diabetes include no chest pain. Risk factors for coronary artery disease include diabetes mellitus. Current diabetic treatment includes diet. Frequency home blood tests: does not monitor at home. An ACE inhibitor/angiotensin II receptor blocker is not being taken.     Has been to [facility] last fall.  Has had some ongoing memory issues.  Seen by neurology.  Felt memory more related to inadequate pain control.  Seems come in waves.      Prior MRI of brain.  Not thought to be MS.  ? Autoimmune issues vs migraines vs others. ? Prior hx of lupus.      Will have nausea re migraines, other pain issues.  Has had egd in the past.  Using zofran prn.     Patient's medications, allergies, past medical, surgical, social and family histories were reviewed and updated as appropriate.    ROS:  Review of Systems   Constitutional: Negative for fever.   Cardiovascular: Negative for chest pain.   Gastrointestinal: Positive for nausea.   Musculoskeletal: Positive for myalgias.   Neurological: Positive for headaches.           Hemoglobin A1C (%)   Date Value   09/01/2019 6.4     Microscopic Examination (no units)   Date Value   04/24/2018 Not Indicated     LDL Calculated (mg/dL)   Date Value   09/01/2019 114 (H)       Past Medical History:   Diagnosis Date   ??? Asthma    ??? Diabetes mellitus ([place])    ??? Fibromyalgia    ??? GERD (gastroesophageal reflux disease)    ??? Hypothyroidism    ??? IBS (irritable bowel syndrome)    ??? Migraine    ??? Pleurisy     multiple episodes "several yrs ago"   ??? Protein S deficiency ([place])    ??? Pulmonary embolism ([place])    ??? Type II or unspecified type diabetes mellitus without mention of complication, not stated as uncontrolled         No family history on file.    Social History     Socioeconomic History   ??? Marital status: Married     Spouse name: Not on file   ??? Number of children: Not on file   ??? Years of education: Not on file   ??? Highest education level: Not on file   Occupational History   ??? Not on file   Tobacco Use   ??? Smoking status: Never Smoker   ??? Smokeless tobacco: Never Used   Vaping Use   ??? Vaping Use: Never used   Substance and Sexual Activity   ??? Alcohol use: No   ??? Drug use: No   ??? Sexual activity: Yes     Partners: Male   Other Topics Concern   ??? Not on file   Social History Narrative   ??? Not on file     Social Determinants of Health     Financial Resource Strain:    ??? Difficulty of Paying Living Expenses:    Food Insecurity:    ??? Worried About Running Out of Food in the Last Year:    ???  Ran Out of Food in the Last Year:    Transportation Needs:    ??? Lack of Transportation (Medical):    ??? Lack of Transportation (Non-Medical):    Physical Activity:    ??? Days of Exercise per Week:    ??? Minutes of Exercise per Session:    Stress:    ??? Feeling of Stress :    Social Connections:    ??? Frequency of Communication with Friends and Family:    ??? Frequency of Social Gatherings with Friends and Family:    ??? Attends Religious Services:    ??? Marine scientist or Organizations:    ??? Attends Music therapist:    ??? Marital Status:    Intimate Production manager Violence:    ??? Fear of Current or Ex-Partner:    ??? Emotionally Abused:    ??? Physically Abused:    ??? Sexually Abused:        Prior to Visit Medications    Medication Sig Taking? Authorizing Provider   propranolol (INDERAL LA) 60 MG extended release capsule Take 1 capsule by mouth daily Yes Margaretmary Dys, MD   albuterol sulfate HFA 108 (90 Base) MCG/ACT inhaler INHALE 2 PUFFS BY MOUTH EVERY SIX HOURS AS NEEDED FOR WHEEZING **SHAKE WELL** Yes Margaretmary Dys, MD   methocarbamol (ROBAXIN) 500 MG tablet TAKE ONE TABLET BY MOUTH THREE TIMES A DAY Yes Margaretmary Dys, MD   ondansetron  (ZOFRAN) 4 MG tablet Take 1 tablet by mouth every 8 hours as needed for Nausea or Vomiting Yes Margaretmary Dys, MD   thyroid (NP THYROID) 30 MG tablet TAKE ONE TABLET BY MOUTH EVERY DAY Yes Margaretmary Dys, MD   warfarin (COUMADIN) 5 MG tablet TAKE UP TO 2 TABLETS BY MOUTH EVERY DAY AS DIRECTED Yes Margaretmary Dys, MD   warfarin (COUMADIN) 1 MG tablet   Take 5 mg by mouth daily at 1800 Pt takes 7.5 mg every day except Monday and Thursday she takes 5 mg. Yes Historical Provider, MD       Allergies   Allergen Reactions   ??? Phenergan [Promethazine Hcl] Anaphylaxis   ??? Azithromycin Swelling   ??? Gluten Meal    ??? Lactose Diarrhea   ??? Orange Syrup    ??? Tomato    ??? Topamax [Topiramate] Other (See Comments)     Chest pain   ??? Codeine Nausea And Vomiting   ??? Hydrocodone Nausea And Vomiting       OBJECTIVE:    BP 110/74    Pulse 80    Ht 5\' 2"  (1.575 m)    Wt 201 lb 6.4 oz (91.4 kg)    SpO2 97%    BMI 36.84 kg/m??     BP Readings from Last 2 Encounters:   08/22/20 110/74   09/01/19 (!) 140/86       Wt Readings from Last 3 Encounters:   08/22/20 201 lb 6.4 oz (91.4 kg)   09/01/19 197 lb 3.2 oz (89.4 kg)   02/02/19 194 lb 6.4 oz (88.2 kg)       Physical Exam  Constitutional:       Appearance: She is well-developed.   HENT:      Head: Normocephalic and atraumatic.      Right Ear: Hearing normal.      Left Ear: Hearing normal.   Eyes:      Conjunctiva/sclera: Conjunctivae normal.   Neck:      Trachea: No tracheal deviation.   Cardiovascular:  Rate and Rhythm: Normal rate and regular rhythm.   Pulmonary:      Effort: Pulmonary effort is normal.      Breath sounds: Normal breath sounds.   Abdominal:      Palpations: Abdomen is soft.      Tenderness: There is no abdominal tenderness.   Skin:     General: Skin is warm and dry.   Neurological:      Mental Status: She is alert and oriented to person, place, and time.   Psychiatric:         Mood and Affect: Mood normal.         Behavior: Behavior normal.           ASSESSMENT/PLAN:    1.  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)  Inr at goal.  Travels a lot.  Wonders about home INR monitoring due to travel and diff getting into office.    - POCT INR  - CBC Auto Differential    2. Migraine without status migrainosus, not intractable, unspecified migraine type  Refill meds for tachycardia and migraines.    - propranolol (INDERAL LA) 60 MG extended release capsule; Take 1 capsule by mouth daily  Dispense: 90 capsule; Refill: 3    3. Fibromyalgia  refill  - methocarbamol (ROBAXIN) 500 MG tablet; TAKE ONE TABLET BY MOUTH THREE TIMES A DAY  Dispense: 60 tablet; Refill: 3    4. Hypothyroidism, unspecified type  Rpt labs.  Consider adjust dose slightly.    - thyroid (NP THYROID) 30 MG tablet; TAKE ONE TABLET BY MOUTH EVERY DAY  Dispense: 30 tablet; Refill: 0  - TSH without Reflex  - T4, Free    6. Protein S deficiency ([place])  Refill meds.   - warfarin (COUMADIN) 5 MG tablet; TAKE UP TO 2 TABLETS BY MOUTH EVERY DAY AS DIRECTED  Dispense: 60 tablet; Refill: 5  - CBC Auto Differential    7. Type 2 diabetes mellitus without complication, without long-term current use of insulin ([place])  Rpt labs today. Cont to try and be active.    - Lipid Panel  - Hemoglobin A1C  - Comprehensive Metabolic Panel  - Microalbumin / Creatinine Urine Ratio    8. Memory loss  Rpt labs.  Ongoing issues.    - Vitamin B12  - METHYLMALONIC ACID, SERUM    9. History of wheezing  Refill prn med.   - albuterol sulfate HFA 108 (90 Base) MCG/ACT inhaler; INHALE 2 PUFFS BY MOUTH EVERY SIX HOURS AS NEEDED FOR WHEEZING **SHAKE WELL**  Dispense: 18 g; Refill: 3

## 2020-08-23 LAB — LIPID PANEL
Cholesterol, Total: 237 mg/dL — ABNORMAL HIGH (ref 0–199)
HDL: 51 mg/dL (ref 40–60)
LDL Calculated: 147 mg/dL — ABNORMAL HIGH (ref <100)
Triglycerides: 194 mg/dL — ABNORMAL HIGH (ref 0–150)
VLDL Cholesterol Calculated: 39 mg/dL

## 2020-08-23 LAB — CBC WITH AUTO DIFFERENTIAL
Basophils %: 0.7 %
Basophils Absolute: 0 10*3/uL (ref 0.0–0.2)
Eosinophils %: 2.2 %
Eosinophils Absolute: 0.1 10*3/uL (ref 0.0–0.6)
Hematocrit: 40.3 % (ref 36.0–48.0)
Hemoglobin: 13.6 g/dL (ref 12.0–16.0)
Lymphocytes %: 29.9 %
Lymphocytes Absolute: 1.5 10*3/uL (ref 1.0–5.1)
MCH: 31 pg (ref 26.0–34.0)
MCHC: 33.8 g/dL (ref 31.0–36.0)
MCV: 91.5 fL (ref 80.0–100.0)
MPV: 9.8 fL (ref 5.0–10.5)
Monocytes %: 8.7 %
Monocytes Absolute: 0.4 10*3/uL (ref 0.0–1.3)
Neutrophils %: 58.5 %
Neutrophils Absolute: 3 10*3/uL (ref 1.7–7.7)
Platelets: 273 10*3/uL (ref 135–450)
RBC: 4.41 M/uL (ref 4.00–5.20)
RDW: 12.9 % (ref 12.4–15.4)
WBC: 5.1 10*3/uL (ref 4.0–11.0)

## 2020-08-23 LAB — T4, FREE: T4 Free: 1.3 ng/dL (ref 0.9–1.8)

## 2020-08-23 LAB — COMPREHENSIVE METABOLIC PANEL
ALT: 35 U/L (ref 10–40)
AST: 27 U/L (ref 15–37)
Albumin/Globulin Ratio: 1.6 (ref 1.1–2.2)
Albumin: 4.2 g/dL (ref 3.4–5.0)
Alkaline Phosphatase: 118 U/L (ref 40–129)
Anion Gap: 18 — ABNORMAL HIGH (ref 3–16)
BUN: 13 mg/dL (ref 7–20)
CO2: 20 mmol/L — ABNORMAL LOW (ref 21–32)
Calcium: 8.8 mg/dL (ref 8.3–10.6)
Chloride: 101 mmol/L (ref 99–110)
Creatinine: 0.7 mg/dL (ref 0.6–1.1)
GFR African American: >60 (ref >60)
GFR Non-African American: >60 (ref >60)
Globulin: 2.7 g/dL
Glucose: 123 mg/dL — ABNORMAL HIGH (ref 70–99)
Potassium: 4.5 mmol/L (ref 3.5–5.1)
Sodium: 139 mmol/L (ref 136–145)
Total Bilirubin: 0.7 mg/dL (ref 0.0–1.0)
Total Protein: 6.9 g/dL (ref 6.4–8.2)

## 2020-08-23 LAB — MICROALBUMIN / CREATININE URINE RATIO
Creatinine, Ur: 260.1 mg/dL — ABNORMAL HIGH (ref 28.0–259.0)
Microalbumin, Random Urine: <1.2 mg/dL (ref <2.0)

## 2020-08-23 LAB — TSH: TSH: 3.86 u[IU]/mL (ref 0.27–4.20)

## 2020-08-23 LAB — VITAMIN B12: Vitamin B-12: 349 pg/mL (ref 211–911)

## 2020-08-23 LAB — HEMOGLOBIN A1C
Hemoglobin A1C: 6.6 %
eAG: 142.7 mg/dL

## 2020-08-23 MED ORDER — BD SYRINGE/NEEDLE 25G X 5/8" 1 ML MISC
11 refills | Status: DC
Start: 2020-08-23 — End: 2021-04-13

## 2020-08-23 MED ORDER — CYANOCOBALAMIN 1000 MCG/ML IJ SOLN
1000 MCG/ML | INTRAMUSCULAR | 5 refills | Status: DC
Start: 2020-08-23 — End: 2021-04-13

## 2020-08-23 NOTE — Other (Signed)
Ha1c a little higher than last check.  Cont to work on diet and activity.

## 2020-08-23 NOTE — Other (Signed)
Ok to send order for b12 and supplies.  Weekly x 4, then every other week x 2, then monthly

## 2020-08-23 NOTE — Other (Signed)
Urine for microalbumin ok.  B12 again a bit low.  Rec either otc supplement or b12 shots.  Could help energy and memory.  Thyroid labs normal.  Would not change thyroid meds at this time.  Lipids more elevated.  Cbc and cmp essentially normal except glucose.  Ha1c and methylmalonic acid pending.

## 2020-08-25 LAB — METHYLMALONIC ACID, SERUM: Methylmalonic Acid: 0.12 umol/L (ref 0.00–0.40)

## 2020-08-25 NOTE — Other (Signed)
Methylmalonic acid on the low end.  Ok to proceed w/ b12 injections as previously discussed

## 2020-09-28 ENCOUNTER — Encounter

## 2020-09-28 MED ORDER — THYROID 30 MG PO TABS
30 MG | ORAL_TABLET | ORAL | 5 refills | Status: DC
Start: 2020-09-28 — End: 2021-04-13

## 2020-09-28 NOTE — Telephone Encounter (Signed)
Last ov 08/22/20

## 2021-02-26 ENCOUNTER — Encounter

## 2021-02-26 MED ORDER — ALBUTEROL SULFATE HFA 108 (90 BASE) MCG/ACT IN AERS
108 (90 Base) MCG/ACT | RESPIRATORY_TRACT | 3 refills | Status: DC
Start: 2021-02-26 — End: 2021-09-14

## 2021-02-26 NOTE — Telephone Encounter (Signed)
Last office visit 08/22/2020, advised to follow-up 4 months, next appointment 0.

## 2021-04-13 ENCOUNTER — Ambulatory Visit
Admit: 2021-04-13 | Discharge: 2021-04-13 | Payer: PRIVATE HEALTH INSURANCE | Attending: Family Medicine | Primary: Family Medicine

## 2021-04-13 DIAGNOSIS — D689 Coagulation defect, unspecified: Secondary | ICD-10-CM

## 2021-04-13 LAB — POCT PT/INR: INR,(POC): 1.2

## 2021-04-13 MED ORDER — BD SYRINGE/NEEDLE 25G X 5/8" 1 ML MISC
11 refills | Status: AC
Start: 2021-04-13 — End: ?

## 2021-04-13 MED ORDER — WARFARIN SODIUM 5 MG PO TABS
5 MG | ORAL_TABLET | ORAL | 5 refills | Status: DC
Start: 2021-04-13 — End: 2021-05-07

## 2021-04-13 MED ORDER — THYROID 30 MG PO TABS
30 MG | ORAL_TABLET | ORAL | 5 refills | Status: DC
Start: 2021-04-13 — End: 2022-03-28

## 2021-04-13 MED ORDER — ONDANSETRON HCL 4 MG PO TABS
4 MG | ORAL_TABLET | Freq: Three times a day (TID) | ORAL | 1 refills | Status: DC | PRN
Start: 2021-04-13 — End: 2021-12-20

## 2021-04-13 MED ORDER — METHOCARBAMOL 500 MG PO TABS
500 MG | ORAL_TABLET | ORAL | 3 refills | Status: AC
Start: 2021-04-13 — End: ?

## 2021-04-13 MED ORDER — PROPRANOLOL HCL ER 60 MG PO CP24
60 MG | ORAL_CAPSULE | Freq: Every day | ORAL | 3 refills | Status: DC
Start: 2021-04-13 — End: 2021-08-27

## 2021-04-13 MED ORDER — CYANOCOBALAMIN 1000 MCG/ML IJ SOLN
1000 MCG/ML | INTRAMUSCULAR | 5 refills | Status: DC
Start: 2021-04-13 — End: 2021-05-03

## 2021-04-13 NOTE — Progress Notes (Signed)
Chief Complaint   Patient presents with   ??? Hyperlipidemia   ??? Diabetes       HPI:  Jasmine Strickland is a 50 y.o. (DOB: [birth year]) here today   for Hyperlipidemia, DM    DM: is diet controlled. Has not been monitoring diet very well. Thinks HBA1C will be increased.     Hyperlipidemia: is not taking any meds. Need repeat labs. Is not monitoring diet.     Hypothyroid: taking NP thyroid 30mg  daily. Needs repeat labs.     Missed coumadin doses lastnight and the night before. Ran out of medication. Was out of state and did not have. Needs a med refill. Takes 5mg  tuesdays and thursdays and 7.5mg  all other days of the week. INR today is 1.2. resume normal schedule today and have repeat INR on Monday. Needs orders standing for INR. Monday will be in [place].    Has been having issues with smelling of cigarette smoke. x3 weeks and then got better until a week ago but is only intermittent now. was getting nauseated and H/A. Is overall better.      Patient's medications, allergies, past medical, surgical, social and family histories were reviewed and updated as appropriate.    ROS:  Review of Systems   Constitutional: Negative for activity change, appetite change, chills, fatigue, fever and unexpected weight change.   HENT: Negative for rhinorrhea, sore throat and trouble swallowing.    Eyes: Negative for pain, discharge and visual disturbance.   Respiratory: Negative for cough, choking, chest tightness, shortness of breath and wheezing.    Cardiovascular: Negative for chest pain, palpitations and leg swelling.   Gastrointestinal: Negative for abdominal distention, abdominal pain, constipation and diarrhea.   Endocrine: Negative for cold intolerance and heat intolerance.   Genitourinary: Negative for difficulty urinating.   Musculoskeletal: Negative for gait problem and neck stiffness.   Skin: Negative for color change and rash.   Neurological: Negative for dizziness, weakness and headaches.   Psychiatric/Behavioral: Negative  for dysphoric mood and sleep disturbance.           Hemoglobin A1C (%)   Date Value   08/22/2020 6.6     Microscopic Examination (no units)   Date Value   04/24/2018 Not Indicated     Microalbumin, Random Urine (mg/dL)   Date Value   10/22/2020 <1.20     LDL Calculated (mg/dL)   Date Value   06/24/2018 147 (H)       Past Medical History:   Diagnosis Date   ??? Asthma    ??? Diabetes mellitus (HCC)    ??? Fibromyalgia    ??? GERD (gastroesophageal reflux disease)    ??? Hypothyroidism    ??? IBS (irritable bowel syndrome)    ??? Migraine    ??? Pleurisy     multiple episodes "several yrs ago"   ??? Protein S deficiency (HCC)    ??? Pulmonary embolism (HCC)    ??? Type II or unspecified type diabetes mellitus without mention of complication, not stated as uncontrolled        History reviewed. No pertinent family history.    Social History     Socioeconomic History   ??? Marital status: Married     Spouse name: Not on file   ??? Number of children: Not on file   ??? Years of education: Not on file   ??? Highest education level: Not on file   Occupational History   ??? Not on file   Tobacco  Use   ??? Smoking status: Never Smoker   ??? Smokeless tobacco: Never Used   Vaping Use   ??? Vaping Use: Never used   Substance and Sexual Activity   ??? Alcohol use: No   ??? Drug use: No   ??? Sexual activity: Yes     Partners: Male   Other Topics Concern   ??? Not on file   Social History Narrative   ??? Not on file     Social Determinants of Health     Financial Resource Strain:    ??? Difficulty of Paying Living Expenses: Not on file   Food Insecurity:    ??? Worried About Running Out of Food in the Last Year: Not on file   ??? Ran Out of Food in the Last Year: Not on file   Transportation Needs:    ??? Lack of Transportation (Medical): Not on file   ??? Lack of Transportation (Non-Medical): Not on file   Physical Activity:    ??? Days of Exercise per Week: Not on file   ??? Minutes of Exercise per Session: Not on file   Stress:    ??? Feeling of Stress : Not on file   Social Connections:     ??? Frequency of Communication with Friends and Family: Not on file   ??? Frequency of Social Gatherings with Friends and Family: Not on file   ??? Attends Religious Services: Not on file   ??? Active Member of Clubs or Organizations: Not on file   ??? Attends Archivist Meetings: Not on file   ??? Marital Status: Not on file   Intimate Partner Violence:    ??? Fear of Current or Ex-Partner: Not on file   ??? Emotionally Abused: Not on file   ??? Physically Abused: Not on file   ??? Sexually Abused: Not on file   Housing Stability:    ??? Unable to Pay for Housing in the Last Year: Not on file   ??? Number of Places Lived in the Last Year: Not on file   ??? Unstable Housing in the Last Year: Not on file       Prior to Visit Medications    Medication Sig Taking? Authorizing Provider   thyroid (NP THYROID) 30 MG tablet TAKE ONE TABLET BY MOUTH EVERY DAY Yes Harden Mo, APRN - CNP   propranolol (INDERAL LA) 60 MG extended release capsule Take 1 capsule by mouth daily Yes Harden Mo, APRN - CNP   methocarbamol (ROBAXIN) 500 MG tablet TAKE ONE TABLET BY MOUTH THREE TIMES A DAY Yes Harden Mo, APRN - CNP   ondansetron (ZOFRAN) 4 MG tablet Take 1 tablet by mouth every 8 hours as needed for Nausea or Vomiting Yes Harden Mo, APRN - CNP   cyanocobalamin 1000 MCG/ML injection 1000 mcg weekly x 4, then every other week x 2, then monthly Yes Harden Mo, APRN - CNP   SYRINGE/NEEDLE, DISP, 1 ML (B-D SYRINGE/NEEDLE 1CC/25GX5/8) 25G X 5/8" 1 ML MISC 1000 mcg weekly x 4, then every other week x 2, then monthly Yes Harden Mo, APRN - CNP   albuterol sulfate HFA (VENTOLIN HFA) 108 (90 Base) MCG/ACT inhaler INHALE 2 PUFFS BY MOUTH EVERY SIX HOURS AS NEEDED FOR WHEEZING **SHAKE WELL** Yes Margaretmary Dys, MD   warfarin (COUMADIN) 5 MG tablet TAKE UP TO 2 TABLETS BY MOUTH EVERY DAY AS DIRECTED Yes Margaretmary Dys, MD   warfarin (COUMADIN) 1 MG tablet  Take 5 mg by mouth daily at 1800 Pt takes 7.5 mg every day except  Monday and Thursday she takes 5 mg. Yes Historical Provider, MD       Allergies   Allergen Reactions   ??? Phenergan [Promethazine Hcl] Anaphylaxis   ??? Azithromycin Swelling   ??? Gluten Meal    ??? Lactose Diarrhea   ??? Orange Syrup    ??? Tomato    ??? Topamax [Topiramate] Other (See Comments)     Chest pain   ??? Codeine Nausea And Vomiting   ??? Hydrocodone Nausea And Vomiting       OBJECTIVE:    BP 110/82    Pulse 82    Ht 5\' 2"  (1.575 m)    Wt 202 lb 9.6 oz (91.9 kg)    SpO2 97%    BMI 37.06 kg/m??     BP Readings from Last 2 Encounters:   04/13/21 110/82   08/22/20 110/74       Wt Readings from Last 3 Encounters:   04/13/21 202 lb 9.6 oz (91.9 kg)   08/22/20 201 lb 6.4 oz (91.4 kg)   09/01/19 197 lb 3.2 oz (89.4 kg)       Physical Exam  Constitutional:       Appearance: Normal appearance.   HENT:      Head: Normocephalic and atraumatic.      Right Ear: External ear normal.      Left Ear: External ear normal.      Nose: Nose normal. No congestion.      Mouth/Throat:      Mouth: Mucous membranes are moist.      Pharynx: Oropharynx is clear.   Eyes:      Extraocular Movements: Extraocular movements intact.      Pupils: Pupils are equal, round, and reactive to light.   Cardiovascular:      Rate and Rhythm: Normal rate and regular rhythm.      Pulses: Normal pulses.      Heart sounds: Normal heart sounds. No murmur heard.      Pulmonary:      Effort: Pulmonary effort is normal. No respiratory distress.      Breath sounds: Normal breath sounds. No wheezing.   Abdominal:      General: Bowel sounds are normal.      Palpations: Abdomen is soft.      Tenderness: There is no abdominal tenderness.   Musculoskeletal:         General: Normal range of motion.      Cervical back: Normal range of motion and neck supple.      Right lower leg: No edema.      Left lower leg: No edema.   Skin:     General: Skin is warm and dry.      Capillary Refill: Capillary refill takes less than 2 seconds.      Findings: No rash.   Neurological:       General: No focal deficit present.      Mental Status: She is alert and oriented to person, place, and time.      Motor: No weakness.   Psychiatric:         Mood and Affect: Mood normal.         Behavior: Behavior normal.           ASSESSMENT/PLAN:    1. Coagulation disorder ([place])  Patient advised to resume back on her normal dosing of Coumadin.  INR  was 1.2 in office today.  I refilled the patient's 5 mg Coumadin tablets to her pharmacy on file.  Patient takes 5 mg on Tuesdays and Thursdays and 7.5 mg all other days.  Patient was advised to have repeat INR this coming Monday.  Patient was given a standing order for INR checks because she is constantly out of state traveling.  [facility] will [facility]Rs [facility] at [facility].  - POCT INR  - Protime-INR; Standing    2. Hypothyroidism, unspecified type  Medication refill today.  Repeat labs ordered.  We will follow-up with patient on testing results.  - thyroid (NP THYROID) 30 MG tablet; TAKE ONE TABLET BY MOUTH EVERY DAY  Dispense: 30 tablet; Refill: 5  - T4, Free  - TSH    3. Migraine without status migrainosus, not intractable, unspecified migraine type  Medication refill today.  Controlled on current medication regimen.  - propranolol (INDERAL LA) 60 MG extended release capsule; Take 1 capsule by mouth daily  Dispense: 90 capsule; Refill: 3    4. Fibromyalgia  Medication refill today.  Current medication regimen is effective controlling patient's symptoms.  - methocarbamol (ROBAXIN) 500 MG tablet; TAKE ONE TABLET BY MOUTH THREE TIMES A DAY  Dispense: 60 tablet; Refill: 3    5. B12 deficiency  Medication and supplies refilled today.  - cyanocobalamin 1000 MCG/ML injection; 1000 mcg weekly x 4, then every other week x 2, then monthly  Dispense: 1 mL; Refill: 5  - SYRINGE/NEEDLE, DISP, 1 ML (B-D SYRINGE/NEEDLE 1CC/25GX5/8) 25G X 5/8" 1 ML MISC; 1000 mcg weekly x 4, then every other week x 2, then monthly  Dispense: 1 each; Refill: 11  - Vitamin B12    6. Type 2 diabetes mellitus  without complication, without long-term current use of insulin ([place])  Repeat labs today.  Patient states she is not monitoring her diet and feels her hemoglobin A1c will be elevated.  Will await hemoglobin A1c result and determine medication need moving forward.  Patient currently prior to today has been treating diabetes through diet control.  - Comprehensive Metabolic Panel  - Hemoglobin A1C    7. Pure hypercholesterolemia  Repeat labs today.  Patient not currently taking cholesterol medication.  We will follow-up with patient on lipid panel result when available to me.  - LIPID PANEL    8. Protein S deficiency ([place])  Medication refill today  - warfarin (COUMADIN) 5 MG tablet; TAKE UP TO 2 TABLETS BY MOUTH EVERY DAY AS DIRECTED  Dispense: 60 tablet; Refill: 5    35 minutes spent on patient care today    This document was prepared by a combination of typing and transcription through a voice recognition software.    Harden Mo, APRN - CNP

## 2021-04-14 LAB — COMPREHENSIVE METABOLIC PANEL
ALT: 28 U/L (ref 10–40)
AST: 21 U/L (ref 15–37)
Albumin/Globulin Ratio: 1.5 (ref 1.1–2.2)
Albumin: 4 g/dL (ref 3.4–5.0)
Alkaline Phosphatase: 99 U/L (ref 40–129)
Anion Gap: 10 (ref 3–16)
BUN: 11 mg/dL (ref 7–20)
CO2: 24 mmol/L (ref 21–32)
Calcium: 9.1 mg/dL (ref 8.3–10.6)
Chloride: 103 mmol/L (ref 99–110)
Creatinine: 0.8 mg/dL (ref 0.6–1.1)
GFR African American: >60 (ref >60)
GFR Non-African American: >60 (ref >60)
Glucose: 128 mg/dL — ABNORMAL HIGH (ref 70–99)
Potassium: 4.5 mmol/L (ref 3.5–5.1)
Sodium: 137 mmol/L (ref 136–145)
Total Bilirubin: 0.7 mg/dL (ref 0.0–1.0)
Total Protein: 6.7 g/dL (ref 6.4–8.2)

## 2021-04-14 LAB — LIPID PANEL
Cholesterol, Total: 216 mg/dL — ABNORMAL HIGH (ref 0–199)
HDL: 51 mg/dL (ref 40–60)
LDL Calculated: 140 mg/dL — ABNORMAL HIGH (ref <100)
Triglycerides: 127 mg/dL (ref 0–150)
VLDL Cholesterol Calculated: 25 mg/dL

## 2021-04-14 LAB — HEMOGLOBIN A1C
Hemoglobin A1C: 6.5 %
eAG: 139.9 mg/dL

## 2021-04-14 LAB — T4, FREE: T4 Free: 1.3 ng/dL (ref 0.9–1.8)

## 2021-04-14 LAB — VITAMIN B12: Vitamin B-12: 555 pg/mL (ref 211–911)

## 2021-04-14 LAB — TSH: TSH: 3.02 u[IU]/mL (ref 0.27–4.20)

## 2021-04-17 NOTE — Other (Signed)
Resume medicines as prescribed.  Repeat 1 week

## 2021-04-17 NOTE — Other (Signed)
INR is low.  It has been sometime since she had this checked.  Please confirm current dose.  It will need adjusted

## 2021-05-01 NOTE — Other (Signed)
INR at goal.  Continue current coumadin dose and repeat INR in 4 weeks

## 2021-05-03 ENCOUNTER — Encounter

## 2021-05-03 MED ORDER — CYANOCOBALAMIN 1000 MCG/ML IJ SOLN
1000 MCG/ML | INTRAMUSCULAR | 1 refills | Status: AC
Start: 2021-05-03 — End: ?

## 2021-05-05 ENCOUNTER — Encounter

## 2021-05-07 MED ORDER — WARFARIN SODIUM 5 MG PO TABS
5 MG | ORAL_TABLET | ORAL | 11 refills | Status: DC
Start: 2021-05-07 — End: 2021-05-31

## 2021-05-07 NOTE — Telephone Encounter (Signed)
Last office visit 04/13/21, next appointment not scheduled

## 2021-05-31 ENCOUNTER — Encounter

## 2021-05-31 MED ORDER — WARFARIN SODIUM 5 MG PO TABS
5 MG | ORAL_TABLET | ORAL | 1 refills | Status: DC
Start: 2021-05-31 — End: 2022-08-13

## 2021-08-25 ENCOUNTER — Encounter

## 2021-08-27 MED ORDER — PROPRANOLOL HCL ER 60 MG PO CP24
60 MG | ORAL_CAPSULE | ORAL | 3 refills | Status: AC
Start: 2021-08-27 — End: ?

## 2021-08-27 NOTE — Telephone Encounter (Signed)
Last office visit 04/13/2021, advised to follow-up 3 months, next appointment 0.  Appointment letter mailed

## 2021-09-14 ENCOUNTER — Encounter

## 2021-09-14 MED ORDER — ALBUTEROL SULFATE HFA 108 (90 BASE) MCG/ACT IN AERS
10890 (90 Base) MCG/ACT | RESPIRATORY_TRACT | 3 refills | Status: AC
Start: 2021-09-14 — End: 2021-12-20

## 2021-09-14 NOTE — Telephone Encounter (Signed)
Last office visit 04/13/2021, advised to follow-up 3 months, next appointment 0.

## 2021-09-25 NOTE — Other (Signed)
If she has been taking medication consistently, go to 5 mg Tuesday, 7.5 mg all other days.  Repeat in 2 weeks recommended

## 2021-09-25 NOTE — Other (Signed)
INR low.  She has not been seen in some time.  Last INR several months ago.  Confirm what she is actually taking from a Coumadin standpoint.  Due for follow-up appointment as well

## 2021-09-25 NOTE — Telephone Encounter (Signed)
Spoke to pt

## 2021-10-04 ENCOUNTER — Ambulatory Visit
Admit: 2021-10-04 | Discharge: 2021-10-04 | Payer: PRIVATE HEALTH INSURANCE | Attending: Family Medicine | Primary: Family Medicine

## 2021-10-04 DIAGNOSIS — E039 Hypothyroidism, unspecified: Secondary | ICD-10-CM

## 2021-10-04 LAB — POCT PT/INR
INR,(POC): 1.5
Prothrombin time,(POC): 0

## 2021-10-04 NOTE — Progress Notes (Signed)
Chief Complaint   Patient presents with    Diabetes       HPI:  Jasmine Strickland is a 50 y.o. (DOB: 13-Apr-1971) here today   for   Diabetes  She presents for her follow-up diabetic visit. She has type 2 diabetes mellitus. Her disease course has been stable. Symptoms are stable. Risk factors for coronary artery disease include diabetes mellitus. Frequency home blood tests: does not monitor.   Has made some diet changes.  Has been eating 900 cal per day and inc protein intake.  Now 90 to 100 g of protein per day.  Has cont to be low carb.  Overall less fat than prior.  Overall feels well. Working on walking as well.      Has been taking coumadin regularly.  Currently taking 7.5mg  all days except 5mg  on thurs. No new issues w/ clotting noted.     Still some pain.  Knee and ankle pain improved.  Occas foot pain.  Has been walking daily.  In the arch.  Sxs over past 3 weeks or so.      Massage therapy seems to help pain w/ fibromyalgia.      Patient's medications, allergies, past medical, surgical, social and family histories were reviewed and updated as appropriate.    ROS:  Review of Systems   Constitutional:  Negative for fever.   Respiratory:  Negative for shortness of breath.    Gastrointestinal:  Negative for constipation and diarrhea.         Hemoglobin A1C (%)   Date Value   04/13/2021 6.5     Microscopic Examination (no units)   Date Value   04/24/2018 Not Indicated     Microalbumin, Random Urine (mg/dL)   Date Value   08/22/2020 <1.20     LDL Calculated (mg/dL)   Date Value   04/13/2021 140 (H)       Past Medical History:   Diagnosis Date    Asthma     Diabetes mellitus (HCC)     Fibromyalgia     GERD (gastroesophageal reflux disease)     Hypothyroidism     IBS (irritable bowel syndrome)     Migraine     Pleurisy     multiple episodes "several yrs ago"    Protein S deficiency ([place])     Pulmonary embolism (HCC)     Type II or unspecified type diabetes mellitus without mention of complication, not stated as  uncontrolled        No family history on file.    Social History     Socioeconomic History    Marital status: Married     Spouse name: Not on file    Number of children: Not on file    Years of education: Not on file    Highest education level: Not on file   Occupational History    Not on file   Tobacco Use    Smoking status: Never    Smokeless tobacco: Never   Vaping Use    Vaping Use: Never used   Substance and Sexual Activity    Alcohol use: No    Drug use: No    Sexual activity: Yes     Partners: Male   Other Topics Concern    Not on file   Social History Narrative    Not on file     Social Determinants of Health     Financial Resource Strain: Not on file   Food  Insecurity: Not on file   Transportation Needs: Not on file   Physical Activity: Not on file   Stress: Not on file   Social Connections: Not on file   Intimate Partner Violence: Not on file   Housing Stability: Not on file       Prior to Visit Medications    Medication Sig Taking? Authorizing Provider   albuterol sulfate HFA (PROVENTIL;VENTOLIN;PROAIR) 108 (90 Base) MCG/ACT inhaler INHALE 2 PUFFS BY MOUTH EVERY 6 HOURS AS NEEDED FOR WHEEZING Yes Margaretmary Dys, MD   propranolol (INDERAL LA) 60 MG extended release capsule TAKE 1 CAPSULE BY MOUTH EVERY DAY Yes Harden Mo, APRN - CNP   warfarin (COUMADIN) 5 MG tablet TAKE UP TO 2 TABLETS BY MOUTH EVERY DAY Yes Margaretmary Dys, MD   cyanocobalamin 1000 MCG/ML injection INJECT 1ML INTO THE MUSCLE ONCE A WEEK FOR 4 WEEKS THEN EVERY OTHER WEEK FOR 2 DOSES THEN MONTHLY Yes Margaretmary Dys, MD   thyroid (NP THYROID) 30 MG tablet TAKE ONE TABLET BY MOUTH EVERY DAY Yes Harden Mo, APRN - CNP   methocarbamol (ROBAXIN) 500 MG tablet TAKE ONE TABLET BY MOUTH THREE TIMES A DAY Yes Harden Mo, APRN - CNP   ondansetron (ZOFRAN) 4 MG tablet Take 1 tablet by mouth every 8 hours as needed for Nausea or Vomiting Yes Harden Mo, APRN - CNP   SYRINGE/NEEDLE, DISP, 1 ML (B-D SYRINGE/NEEDLE 1CC/25GX5/8) 25G  X 5/8" 1 ML MISC 1000 mcg weekly x 4, then every other week x 2, then monthly Yes Harden Mo, APRN - CNP   warfarin (COUMADIN) 1 MG tablet   Take 5 mg by mouth daily at 1800 Pt takes 7.5 mg every day except Monday and Thursday she takes 5 mg. Yes Historical Provider, MD       Allergies   Allergen Reactions    Phenergan [Promethazine Hcl] Anaphylaxis    Azithromycin Swelling    Gluten Meal     Lactose Diarrhea    Orange Syrup     Tomato     Topamax [Topiramate] Other (See Comments)     Chest pain    Codeine Nausea And Vomiting    Hydrocodone Nausea And Vomiting       OBJECTIVE:    BP 136/84    Pulse 96    Ht 5\' 2"  (1.575 m)    Wt 173 lb 9.6 oz (78.7 kg)    SpO2 98%    BMI 31.75 kg/m??     BP Readings from Last 2 Encounters:   10/04/21 136/84   04/13/21 110/82       Wt Readings from Last 3 Encounters:   10/04/21 173 lb 9.6 oz (78.7 kg)   04/13/21 202 lb 9.6 oz (91.9 kg)   08/22/20 201 lb 6.4 oz (91.4 kg)       Physical Exam  Constitutional:       Appearance: She is well-developed.   HENT:      Head: Normocephalic and atraumatic.      Right Ear: Hearing normal.      Left Ear: Hearing normal.   Eyes:      Conjunctiva/sclera: Conjunctivae normal.   Neck:      Trachea: No tracheal deviation.   Cardiovascular:      Rate and Rhythm: Normal rate and regular rhythm.   Pulmonary:      Effort: Pulmonary effort is normal.      Breath sounds: Normal breath sounds.   Abdominal:  Palpations: Abdomen is soft.      Tenderness: There is no abdominal tenderness.   Skin:     General: Skin is warm and dry.   Neurological:      Mental Status: She is alert and oriented to person, place, and time.   Psychiatric:         Mood and Affect: Mood normal.         Behavior: Behavior normal.         ASSESSMENT/PLAN:    1. Coagulation disorder (HCC)  INR slightly below goal.  Increase to 7.5 mg daily.  Repeat 1 month.  Labs as below  - POCT INR  - CBC with Auto Differential    2. Hypothyroidism, unspecified type  Repeat labs.  Ongoing  issues with fatigue.  Labs okay last check  - TSH  - T4, Free    3. Type 2 diabetes mellitus without complication, without long-term current use of insulin ([place])  Has been working on diet.  His increased protein intake.  Is lost significant mount of weight.  Repeat labs  - Hemoglobin A1C  - Comprehensive Metabolic Panel  - Microalbumin / Creatinine Urine Ratio    4. Pure hypercholesterolemia  Has lost weight and changed her diet.  Repeat labs  - Lipid Panel    5. Fibromyalgia  Near baseline.  Discussed some stretching for her feet.  Also discussed yoga.  Monitor symptoms.    6. Other fatigue  Repeat labs  - Vitamin B12    7. Vitamin D deficiency  In the past.  Repeat labs  - Vitamin D 25 Hydroxy        This document was prepared by a combination of typing and transcription through a voice recognition software.

## 2021-10-05 LAB — CBC WITH AUTO DIFFERENTIAL
Basophils %: 0.5 %
Basophils Absolute: 0 10*3/uL (ref 0.0–0.2)
Eosinophils %: 1.6 %
Eosinophils Absolute: 0.1 10*3/uL (ref 0.0–0.6)
Hematocrit: 39.9 % (ref 36.0–48.0)
Hemoglobin: 13.7 g/dL (ref 12.0–16.0)
Lymphocytes %: 35.7 %
Lymphocytes Absolute: 1.5 10*3/uL (ref 1.0–5.1)
MCH: 31.1 pg (ref 26.0–34.0)
MCHC: 34.4 g/dL (ref 31.0–36.0)
MCV: 90.5 fL (ref 80.0–100.0)
MPV: 10.5 fL (ref 5.0–10.5)
Monocytes %: 8.4 %
Monocytes Absolute: 0.4 10*3/uL (ref 0.0–1.3)
Neutrophils %: 53.8 %
Neutrophils Absolute: 2.3 10*3/uL (ref 1.7–7.7)
Platelets: 196 10*3/uL (ref 135–450)
RBC: 4.41 M/uL (ref 4.00–5.20)
RDW: 13.2 % (ref 12.4–15.4)
WBC: 4.3 10*3/uL (ref 4.0–11.0)

## 2021-10-05 LAB — COMPREHENSIVE METABOLIC PANEL
ALT: 8 U/L — ABNORMAL LOW (ref 10–40)
AST: 11 U/L — ABNORMAL LOW (ref 15–37)
Albumin/Globulin Ratio: 2 (ref 1.1–2.2)
Albumin: 4.4 g/dL (ref 3.4–5.0)
Alkaline Phosphatase: 91 U/L (ref 40–129)
Anion Gap: 15 (ref 3–16)
BUN: 13 mg/dL (ref 7–20)
CO2: 22 mmol/L (ref 21–32)
Calcium: 9.5 mg/dL (ref 8.3–10.6)
Chloride: 104 mmol/L (ref 99–110)
Creatinine: 0.7 mg/dL (ref 0.6–1.1)
Est, Glom Filt Rate: >60 (ref >60)
Glucose: 100 mg/dL — ABNORMAL HIGH (ref 70–99)
Potassium: 4.2 mmol/L (ref 3.5–5.1)
Sodium: 141 mmol/L (ref 136–145)
Total Bilirubin: 0.7 mg/dL (ref 0.0–1.0)
Total Protein: 6.6 g/dL (ref 6.4–8.2)

## 2021-10-05 LAB — LIPID PANEL
Cholesterol, Total: 188 mg/dL (ref 0–199)
HDL: 45 mg/dL (ref 40–60)
LDL Calculated: 128 mg/dL — ABNORMAL HIGH (ref <100)
Triglycerides: 77 mg/dL (ref 0–150)
VLDL Cholesterol Calculated: 15 mg/dL

## 2021-10-05 LAB — MICROALBUMIN / CREATININE URINE RATIO
Creatinine, Ur: 213.7 mg/dL (ref 28.0–259.0)
Microalbumin, Random Urine: <1.2 mg/dL (ref <2.0)

## 2021-10-05 LAB — HEMOGLOBIN A1C
Hemoglobin A1C: 5.9 %
eAG: 122.6 mg/dL

## 2021-10-05 LAB — VITAMIN B12: Vitamin B-12: 800 pg/mL (ref 211–911)

## 2021-10-05 LAB — TSH: TSH: 1.77 u[IU]/mL (ref 0.27–4.20)

## 2021-10-05 LAB — VITAMIN D 25 HYDROXY: Vit D, 25-Hydroxy: 39.9 ng/mL (ref >=30)

## 2021-10-05 LAB — T4, FREE: T4 Free: 1.2 ng/dL (ref 0.9–1.8)

## 2021-10-05 NOTE — Other (Signed)
Thyroid labs are normal.  No change at this time.  Lipids significantly improved from last check.  LDL still above goal given history.  Not currently taking any cholesterol medication.  Glucose 100, otherwise CMP essentially normal.  Vitamin D is okay.  Urine for microalbumin is okay.  CBC normal.  B12 and hemoglobin A1c pending

## 2021-10-05 NOTE — Other (Signed)
Sugars well controlled.  Keep up the good work.  Continue work on diet and activity.

## 2021-10-07 NOTE — Other (Signed)
B12 normal

## 2021-10-16 ENCOUNTER — Encounter: Payer: PRIVATE HEALTH INSURANCE | Attending: Family Medicine | Primary: Family Medicine

## 2021-12-20 ENCOUNTER — Encounter

## 2021-12-20 MED ORDER — ALBUTEROL SULFATE HFA 108 (90 BASE) MCG/ACT IN AERS
108 (90 Base) MCG/ACT | RESPIRATORY_TRACT | 3 refills | Status: AC
Start: 2021-12-20 — End: ?

## 2021-12-20 MED ORDER — ONDANSETRON HCL 4 MG PO TABS
4 MG | ORAL_TABLET | Freq: Three times a day (TID) | ORAL | 1 refills | Status: AC | PRN
Start: 2021-12-20 — End: ?

## 2021-12-20 NOTE — Telephone Encounter (Signed)
LOV 10/04/21  FOV None

## 2021-12-20 NOTE — Progress Notes (Signed)
LOV 10/04/21  FOV none

## 2021-12-20 NOTE — Telephone Encounter (Signed)
Last office visit 10/04/2021, advised to follow-up 6 months, next appointment 0.

## 2022-03-28 ENCOUNTER — Encounter

## 2022-03-28 MED ORDER — THYROID 30 MG PO TABS
30 MG | ORAL_TABLET | ORAL | 5 refills | Status: AC
Start: 2022-03-28 — End: ?

## 2022-03-28 NOTE — Telephone Encounter (Signed)
LOV 10/04/21  FOV none

## 2022-08-13 ENCOUNTER — Encounter

## 2022-08-13 MED ORDER — WARFARIN SODIUM 5 MG PO TABS
5 MG | ORAL_TABLET | Freq: Every day | ORAL | 0 refills | Status: DC
Start: 2022-08-13 — End: 2022-12-20

## 2022-08-13 NOTE — Telephone Encounter (Signed)
Letter mailed

## 2022-08-13 NOTE — Telephone Encounter (Signed)
LOV 10/04/22  FOV none

## 2022-08-13 NOTE — Telephone Encounter (Signed)
Please call patient to schedule an appointment.

## 2022-08-13 NOTE — Progress Notes (Unsigned)
LOV

## 2022-09-03 ENCOUNTER — Encounter

## 2022-09-03 MED ORDER — PROPRANOLOL HCL ER 60 MG PO CP24
60 MG | ORAL_CAPSULE | ORAL | 3 refills | Status: DC
Start: 2022-09-03 — End: 2022-12-20

## 2022-09-03 NOTE — Telephone Encounter (Signed)
Last ov 10/04/21

## 2022-12-20 ENCOUNTER — Encounter

## 2022-12-20 MED ORDER — WARFARIN SODIUM 5 MG PO TABS
5 MG | ORAL_TABLET | Freq: Every day | ORAL | 0 refills | Status: AC
Start: 2022-12-20 — End: ?

## 2022-12-20 MED ORDER — PROPRANOLOL HCL ER 60 MG PO CP24
60 MG | ORAL_CAPSULE | ORAL | 1 refills | Status: AC
Start: 2022-12-20 — End: ?

## 2022-12-20 NOTE — Telephone Encounter (Signed)
Last ov 10/04/21

## 2023-02-17 IMAGING — CR KNEE LT 3 VWS
1 series · 3 of 3 positions shown · non-contrast
Comparison: None

Images Obtained from [facility]
Left knee radiographs, 3 views
INDICATION: Pain in left elbow, Pain in left knee, Low back pain

[Series 1: ap · 0.17mm/px · 3 of 3 slices shown]
[im 1/3]
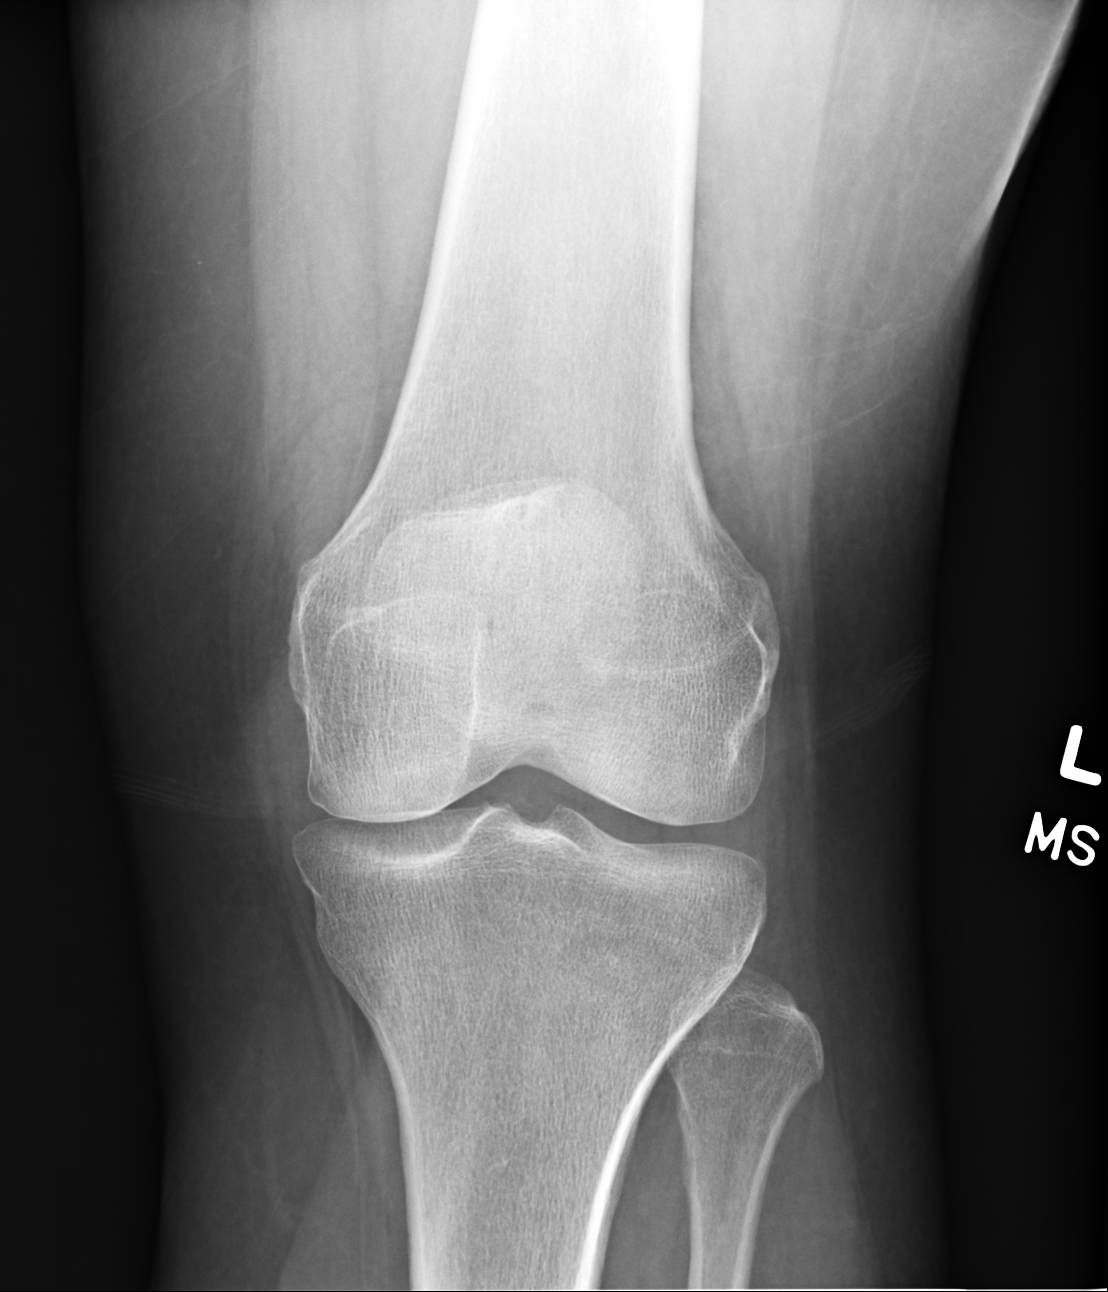
[im 2/3]
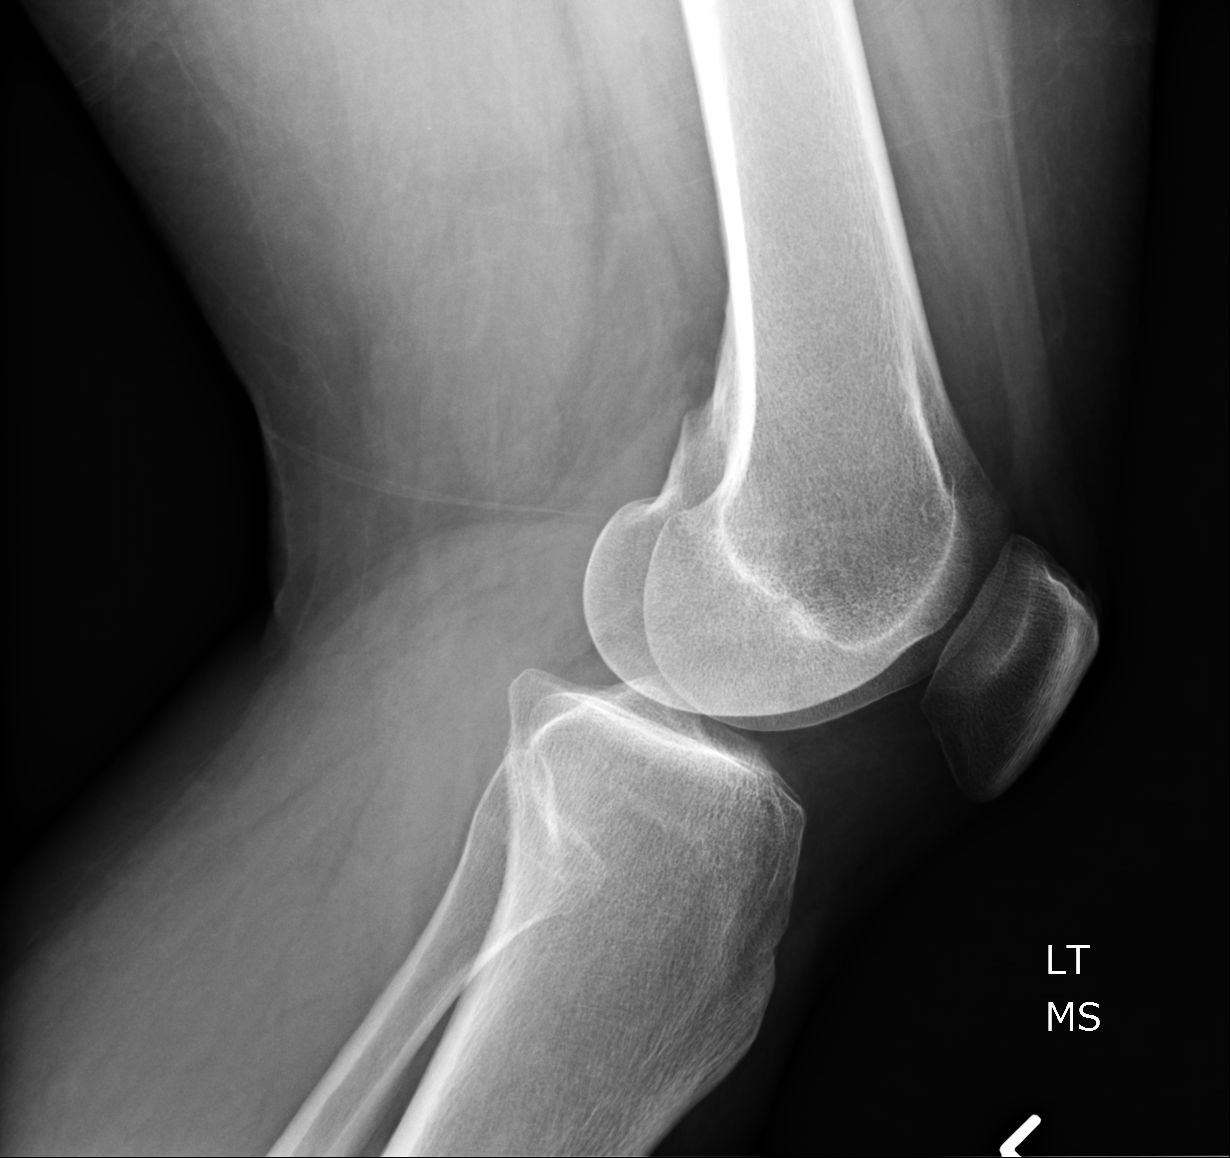
[im 3/3]
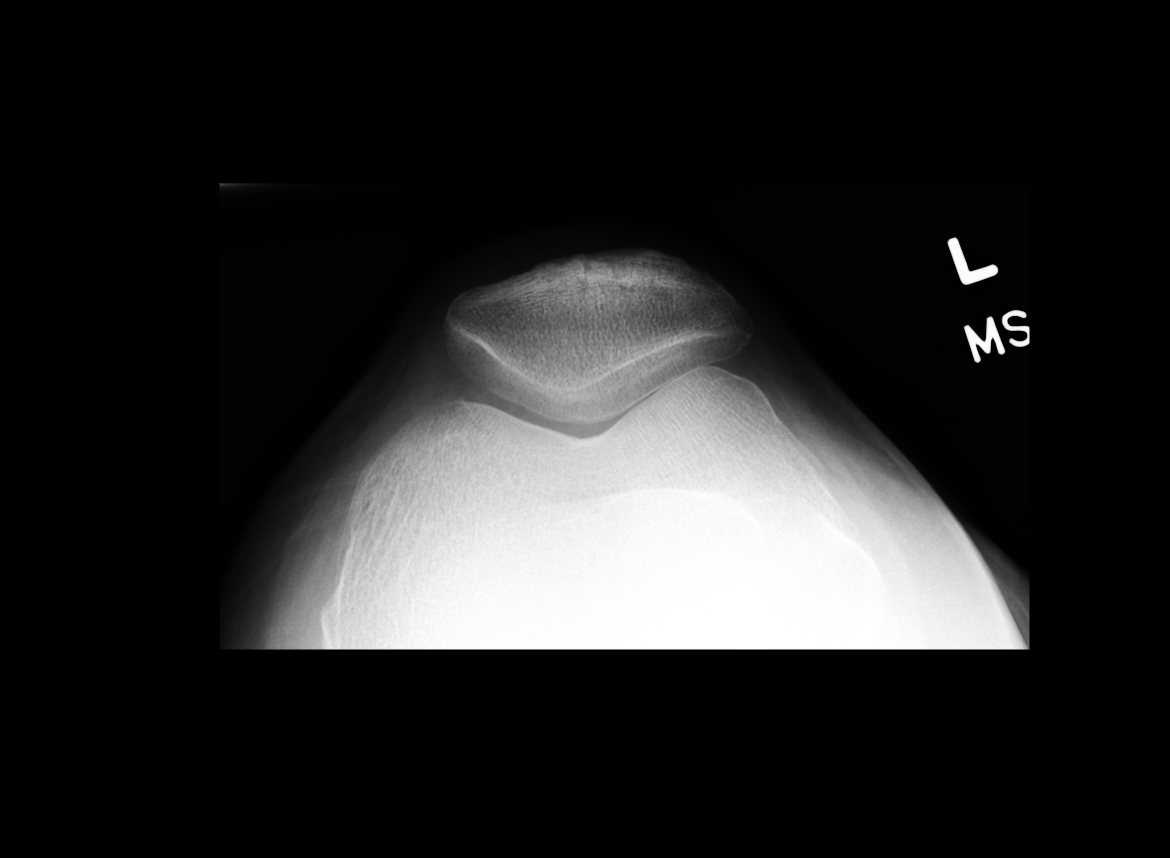

[3 of 3 positions shown; findings below may reference images not displayed]

FINDINGS: No acute fracture. No dislocation. Joint spaces are intact. No joint effusion.
IMPRESSION: Unremarkable left knee radiographs.

## 2023-02-17 IMAGING — CR KNEES STANDING AP BIL
1 series · 1 of 1 positions shown · non-contrast
Comparison: None

Images Obtained from [facility]
Bilateral knee radiograph, one view
INDICATION: Pain in left elbow, Pain in left knee, Low back pain

[ap]
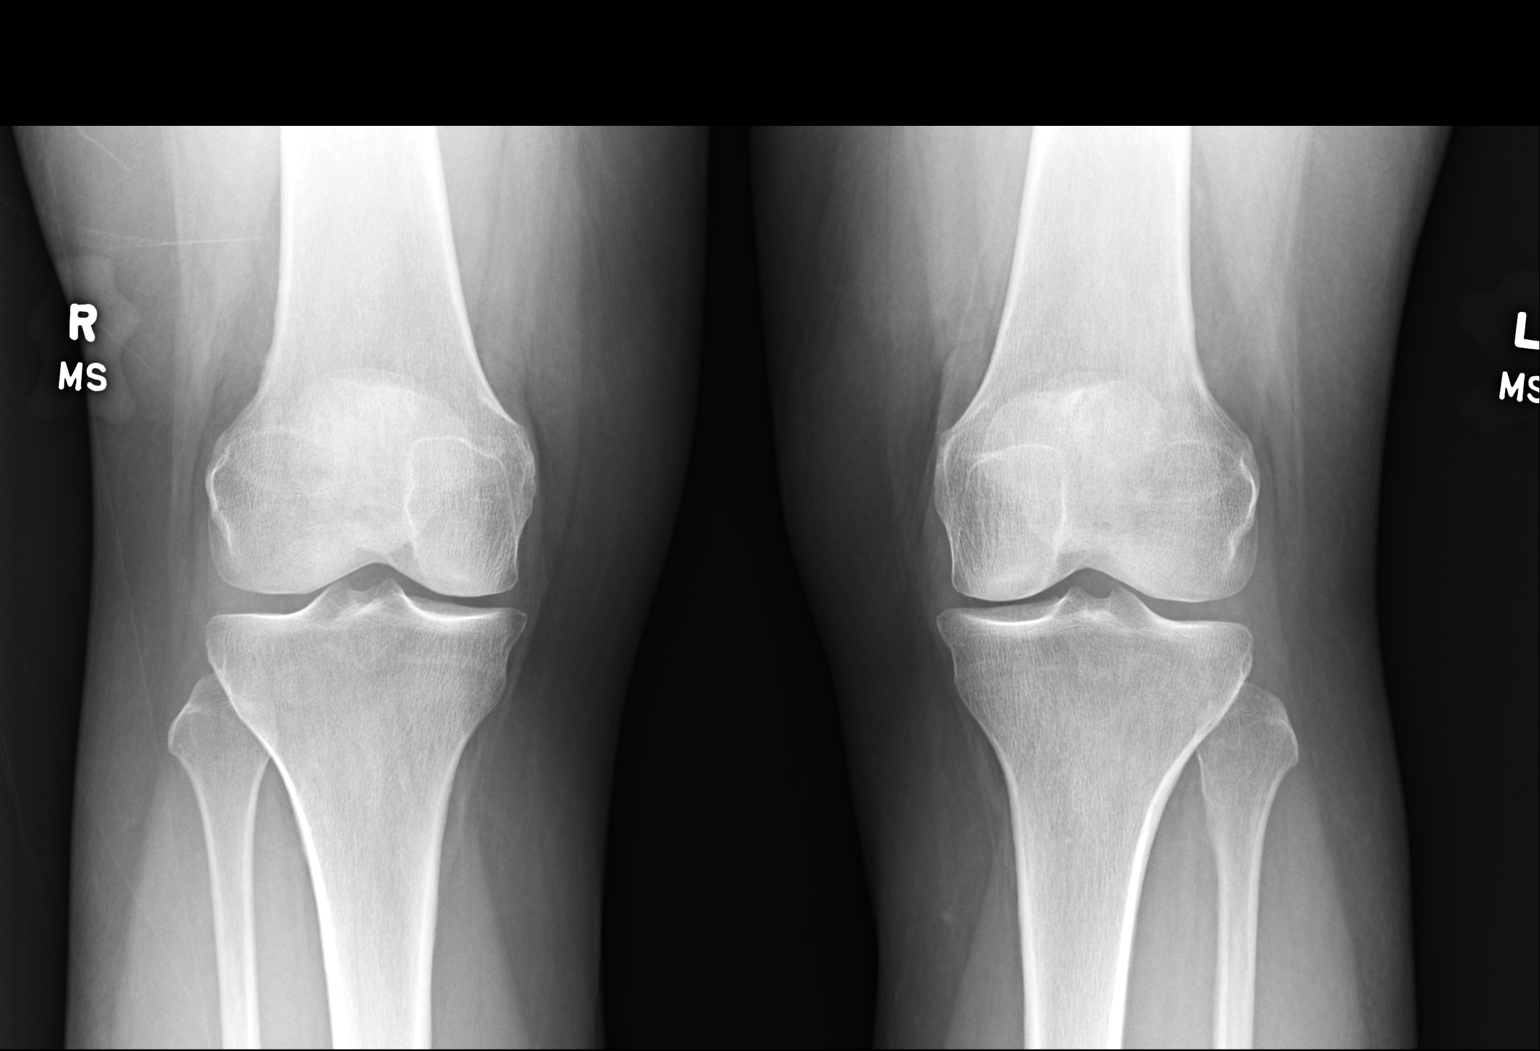

[1 of 1 positions shown; findings below may reference images not displayed]

FINDINGS: No acute fracture. Joint spaces are intact. Soft tissues are unremarkable.
IMPRESSION: Unremarkable bilateral knee radiograph.

## 2023-03-07 IMAGING — MR MRI CSPINE WO CONTRAST
4 of 5 series · 33 of 48 positions shown · non-contrast
Comparison: None

Images Obtained from [facility]
INDICATION: Radiculopathy, cervical region.
TECHNIQUE: Multiplanar, multiecho imaging of the cervical spine was performed, including T1-weighted and fluid sensitive sequences without Intravenous contrast.

[Series 5: t2_sag · sagittal · 3.0mm · 0.57mm/px · 7 of 18 slices shown]
[im 1/18]
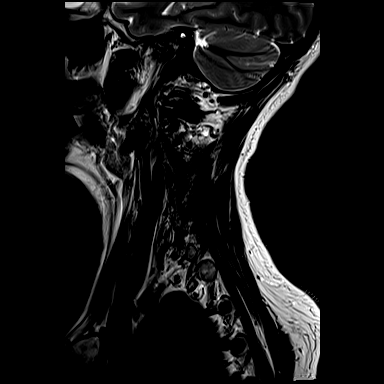
[im 3/18]
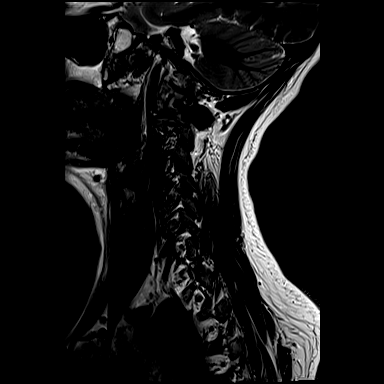
[im 6/18]
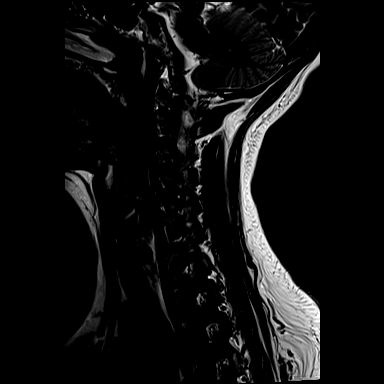
[im 9/18]
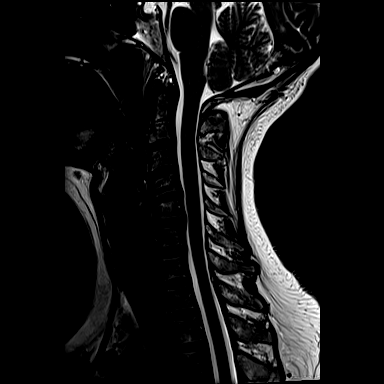
[im 12/18]
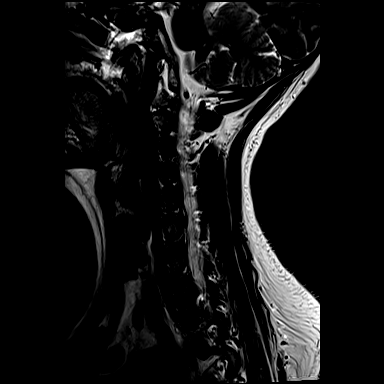
[im 15/18]
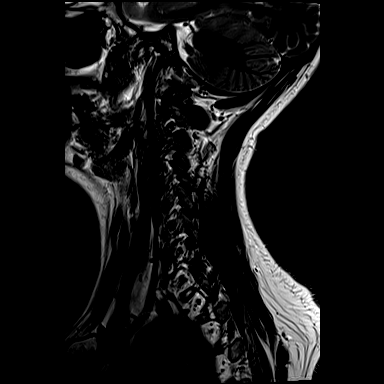
[im 18/18]
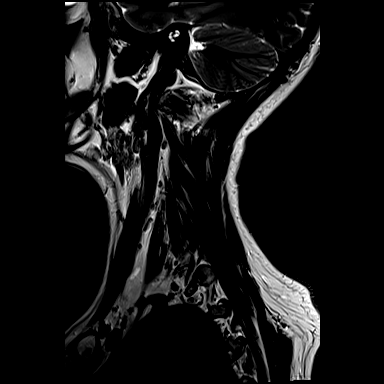

[Series 6: t1_sag · sagittal · 3.0mm · 0.69mm/px · 7 of 18 slices shown]
[im 1/18]
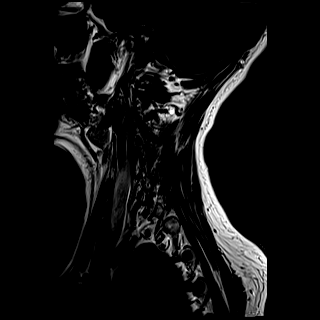
[im 3/18]
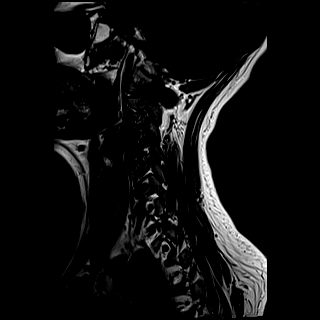
[im 6/18]
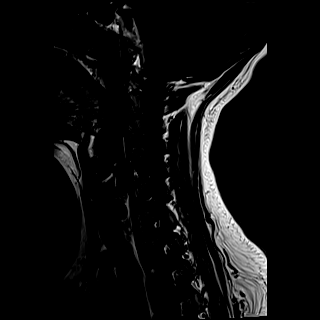
[im 9/18]
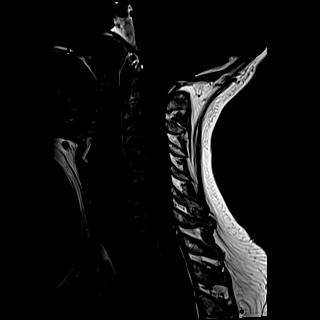
[im 12/18]
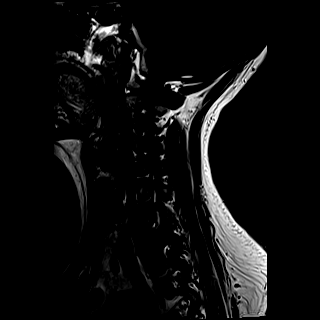
[im 15/18]
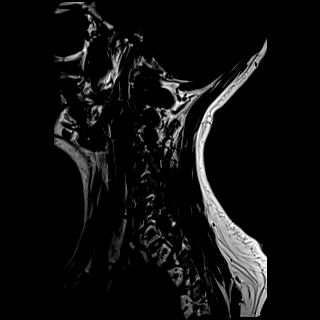
[im 18/18]
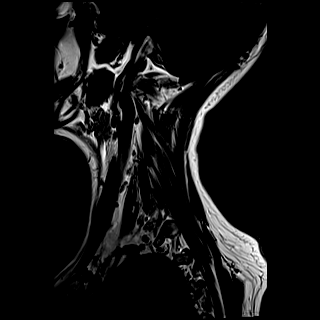

[Series 7: stir_sag · sagittal · 3.0mm · 0.43mm/px · 7 of 18 slices shown]
[im 1/18]
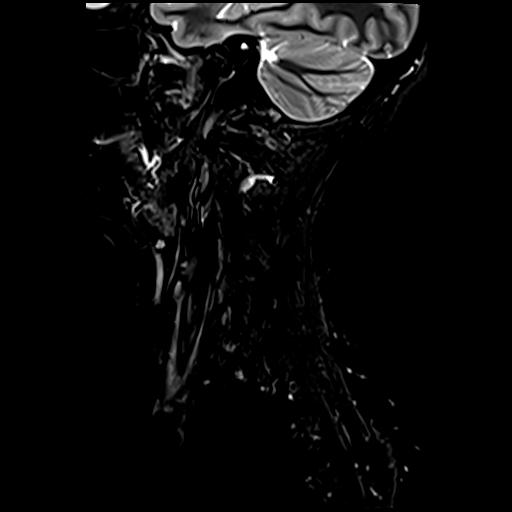
[im 3/18]
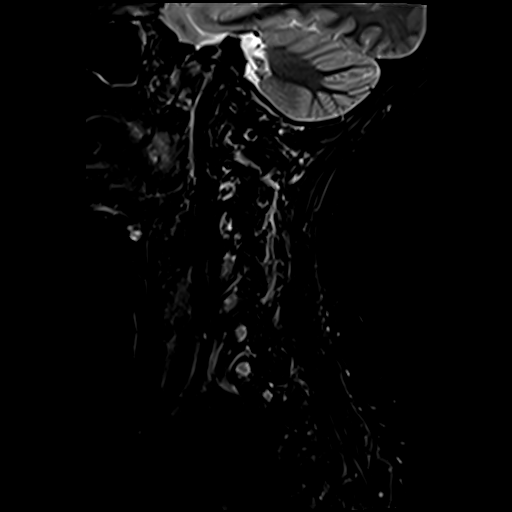
[im 6/18]
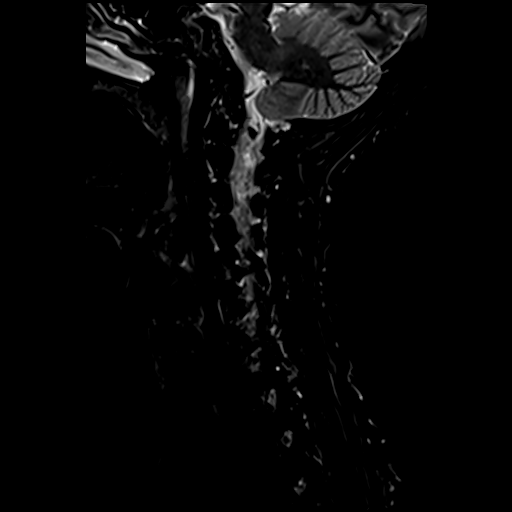
[im 9/18]
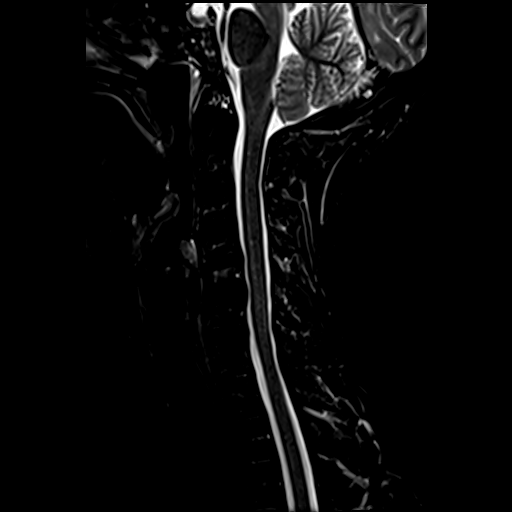
[im 12/18]
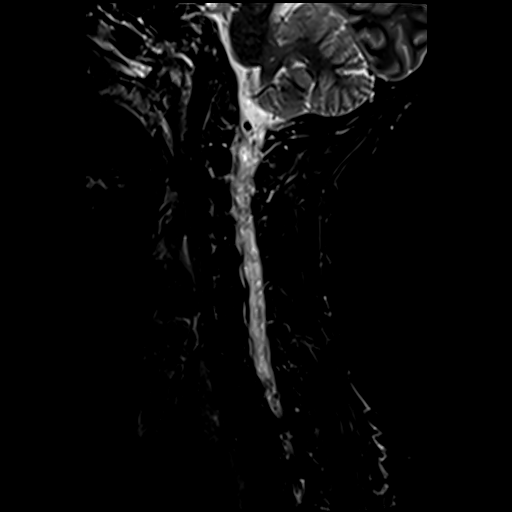
[im 15/18]
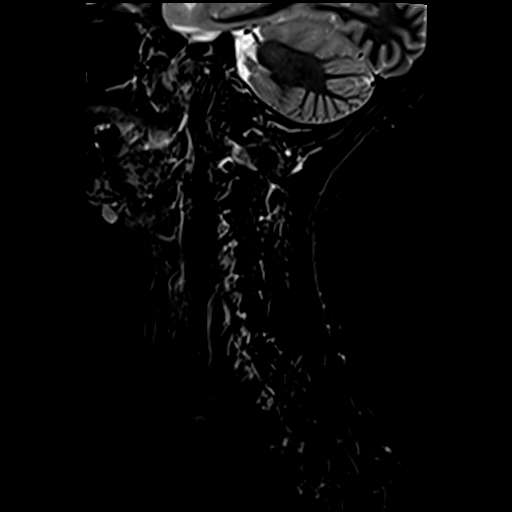
[im 18/18]
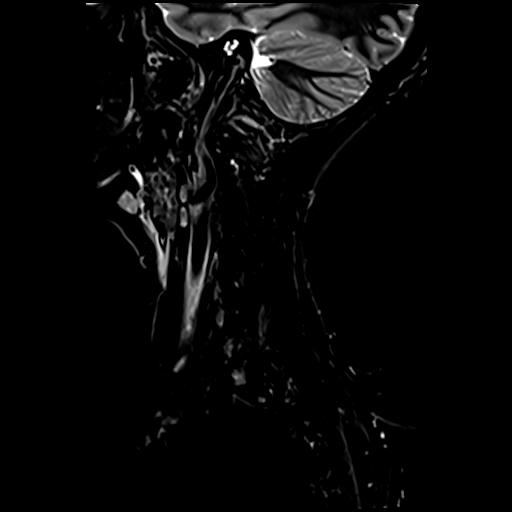

[Series 8: t2_axial · axial · 3.0mm · 0.56mm/px · z∈[-6,+100]mm · 12 of 34 slices shown]
[im 1/34]
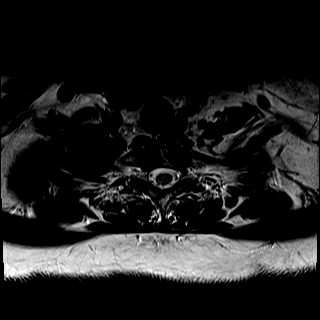
[im 3/34]
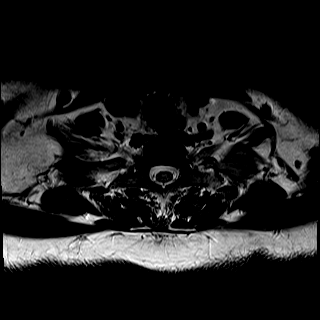
[im 6/34]
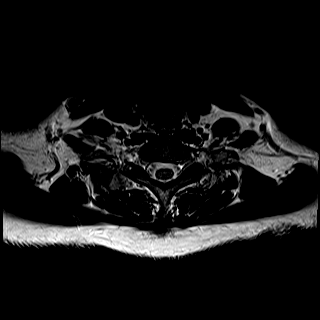
[im 9/34]
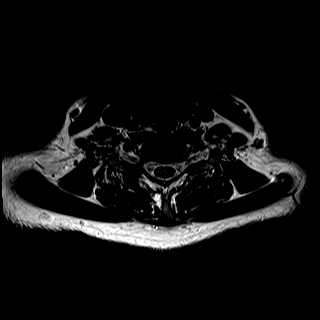
[im 12/34]
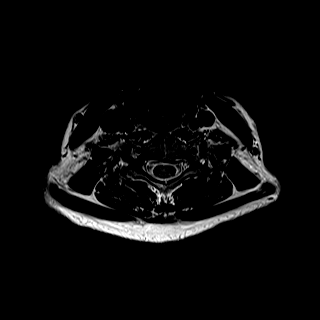
[im 14/34]
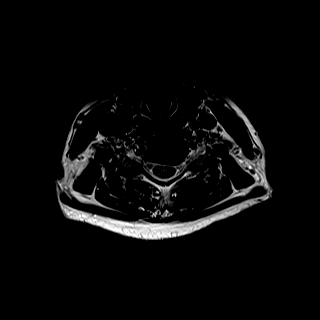
[im 17/34]
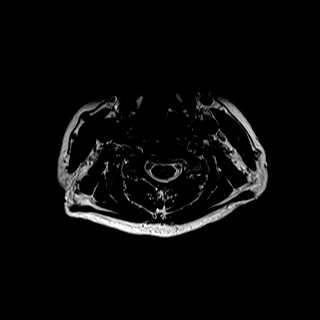
[im 20/34]
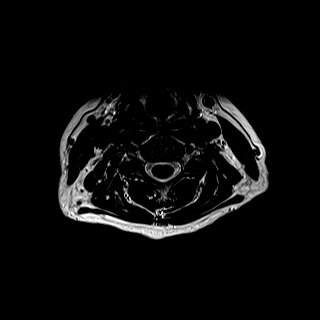
[im 23/34]
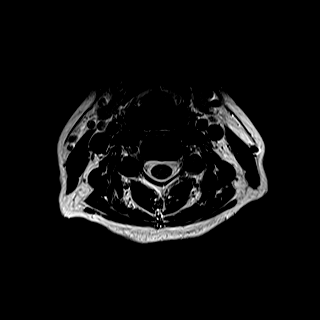
[im 25/34]
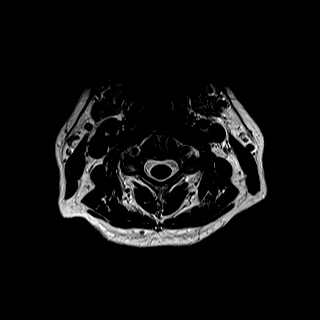
[im 28/34]
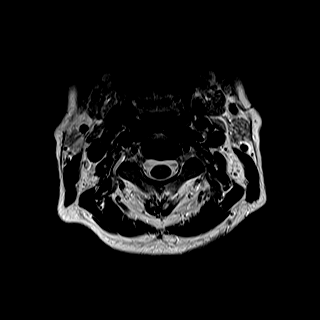
[im 34/34]
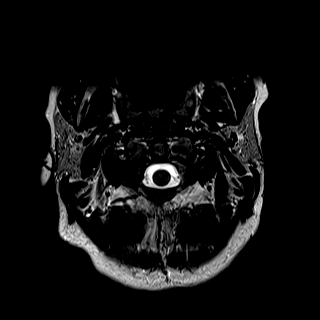

[33 of 48 positions shown; findings below may reference images not displayed]

FINDINGS: Posterior fossa:  The visualized posterior fossa is normal in appearance.
Cervical spinal cord:  The cervical spinal cord has normal signal intensity.
Bone marrow:  Regional bone marrow is normal in appearance.
Alignment: The alignment of the cervical spine is normal on these supine, neutral images.
Multilevel degenerative disc desiccation, loss of disc space height and concentric disc osteophyte pronounced at C4-5 and C5-6
C2-C3: No disc protrusion, canal narrowing, or foraminal narrowing.
C3-C4: No disc protrusion, canal narrowing, or foraminal narrowing.
C4-C5: Mild disc osteophyte. Mild facet arthropathy, more advanced on the right. Mild right foraminal stenosis. No canal stenosis
C5-C6: Mild disc osteophyte. Mild facet arthropathy. No significant canal or foraminal stenosis
C6-C7: Minimal disc osteophyte. Mild facet arthropathy. No canal or foraminal stenosis
C7-T1: No disc protrusion, canal narrowing, or foraminal narrowing.
IMPRESSION: Mild multilevel degenerative disc disease and facet arthropathy, most pronounced at C4-5 with mild right foraminal stenosis. No canal stenosis.

## 2023-03-13 IMAGING — CR HAND LT 3 VWS MIN
1 series · 3 of 3 positions shown · non-contrast
Comparison: None

Images Obtained from [facility]
Left hand radiographs, 3 views
INDICATION: Pain in left hand

[Series 1: pa · 0.17mm/px · 3 of 3 slices shown]
[im 1/3]
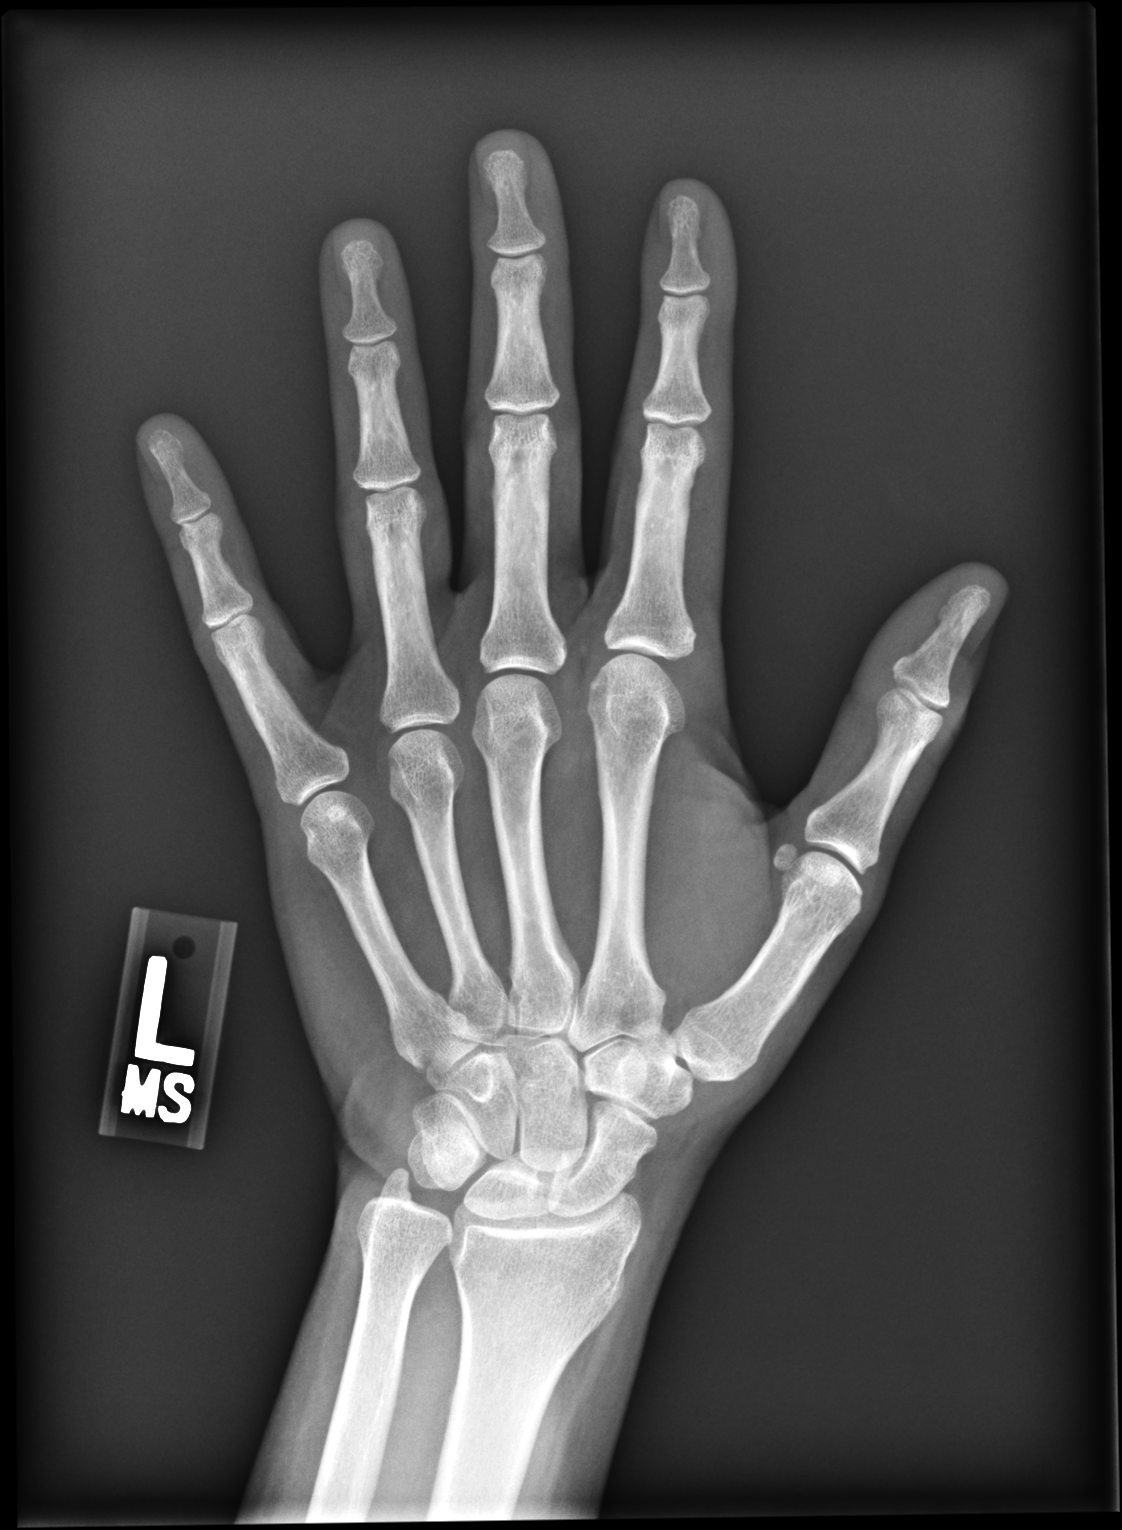
[im 2/3]
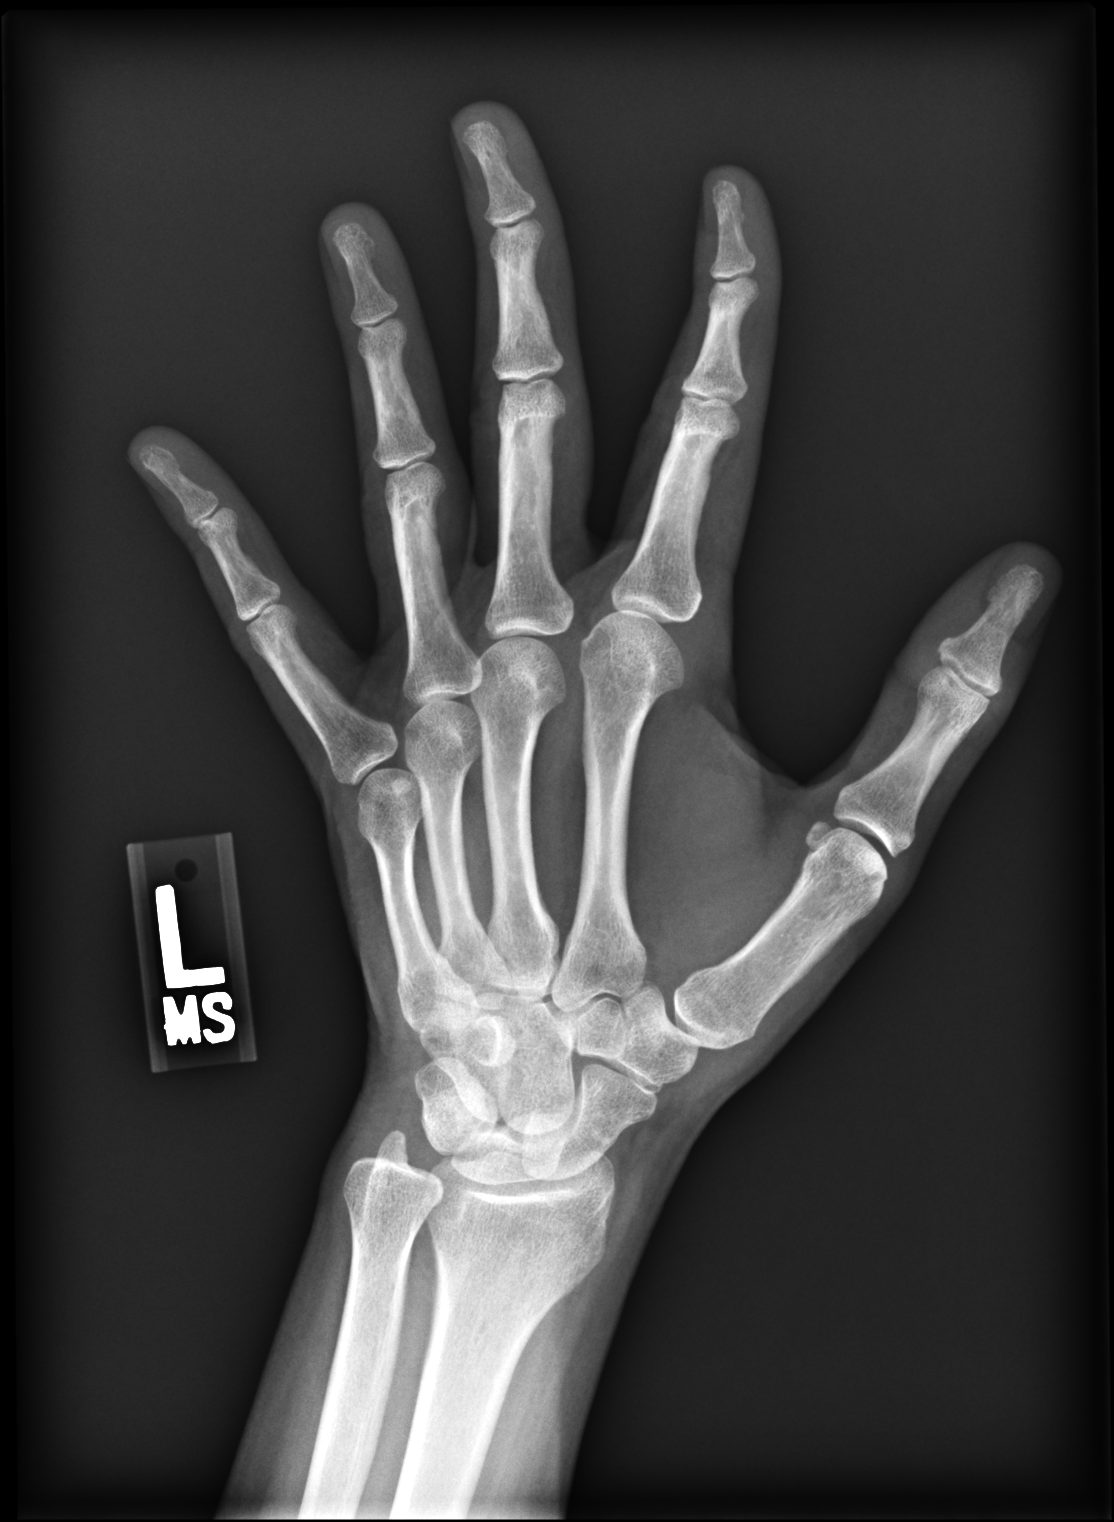
[im 3/3]
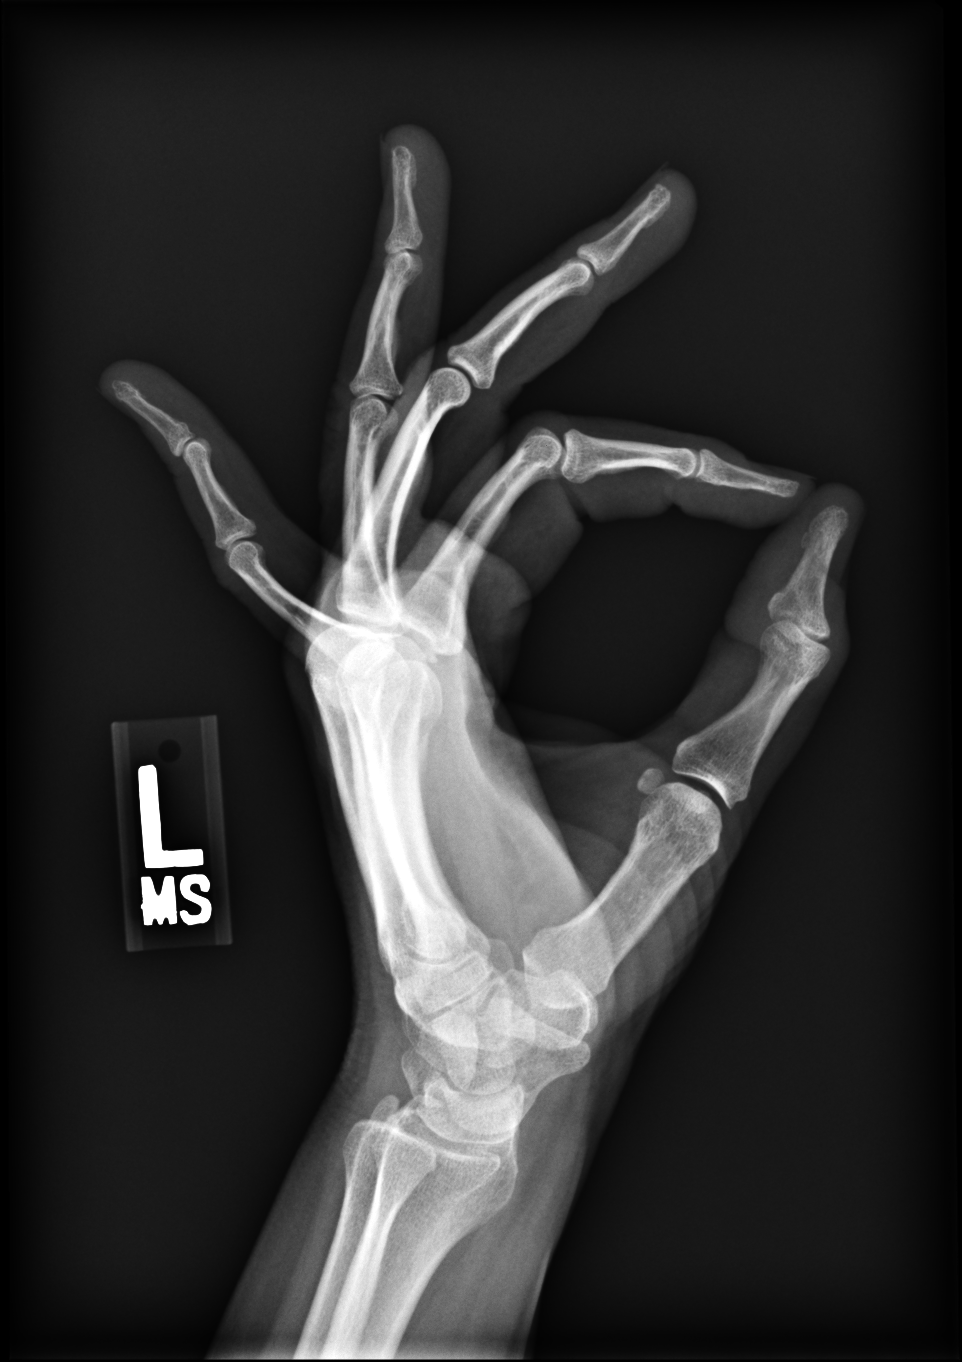

[3 of 3 positions shown; findings below may reference images not displayed]

FINDINGS: No acute fracture. No dislocation. Joint spaces are intact. No erosion.
IMPRESSION: Unremarkable left hand radiographs.

## 2023-03-26 IMAGING — US DOP ARTERIAL LWR EXT BIL
1 series · 13 of 16 positions shown · non-contrast
Comparison: None.

Images Obtained from [facility]
HISTORY: 51 years-old Female with Pain in left leg, pain in right leg, primary thrombophilia.
TECHNIQUE: Multiple gray-scale and color-flow images of the bilateral leg arteries were obtained with ultrasound.  Spectral Doppler analysis was also performed.

[Series 1: dop arterial lwr ext bil · arterial · 13 of 58 slices shown]
[im 1/58]
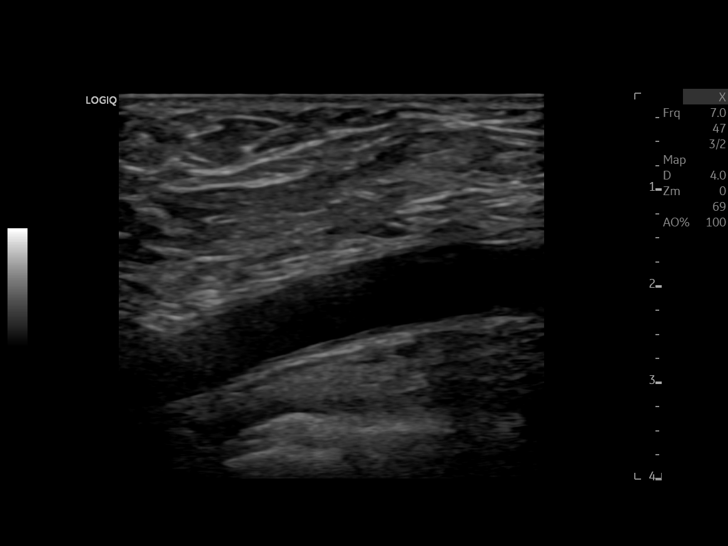
[im 4/58]
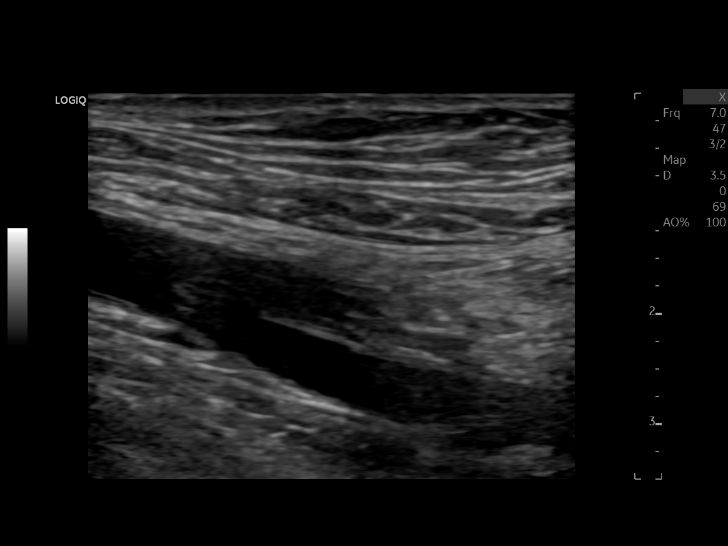
[im 12/58]
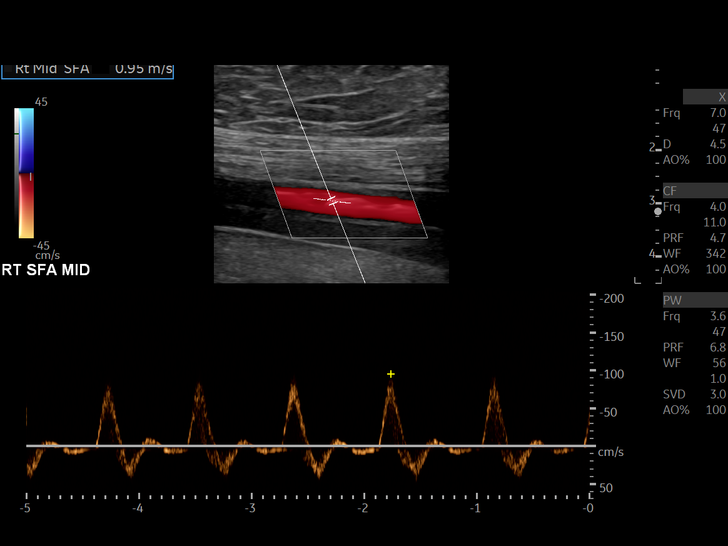
[im 16/58]
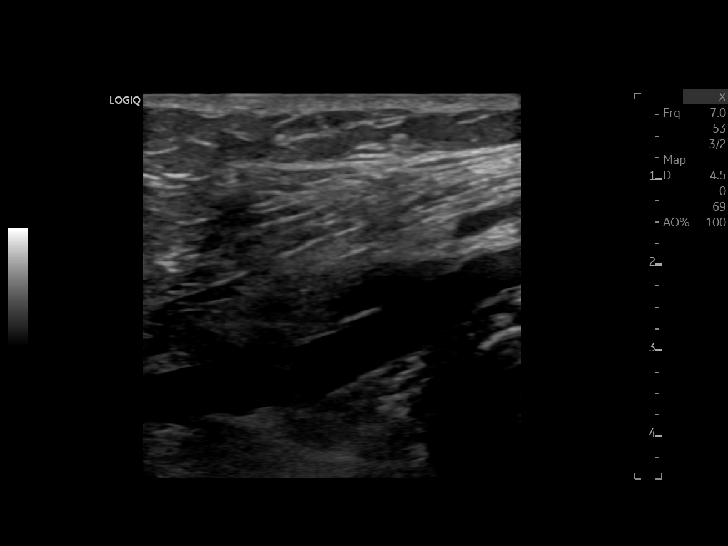
[im 20/58]
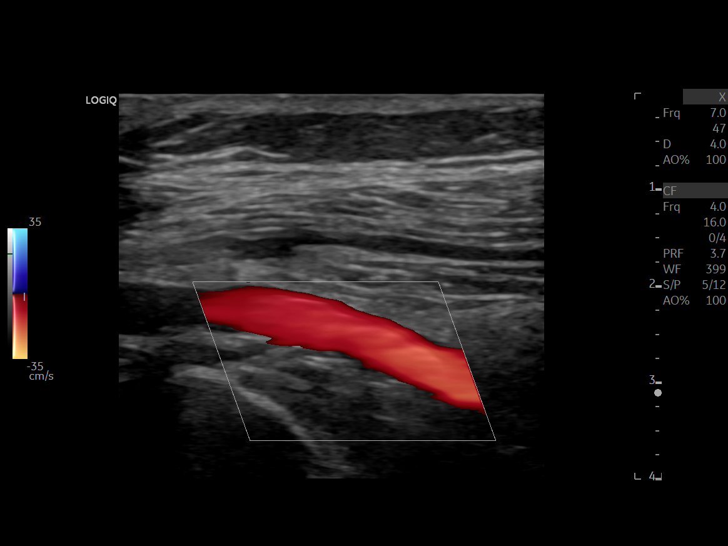
[im 23/58]
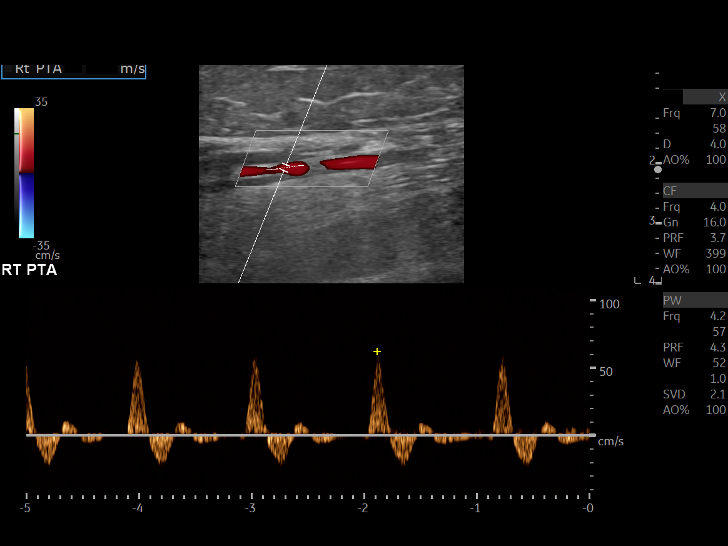
[im 31/58]
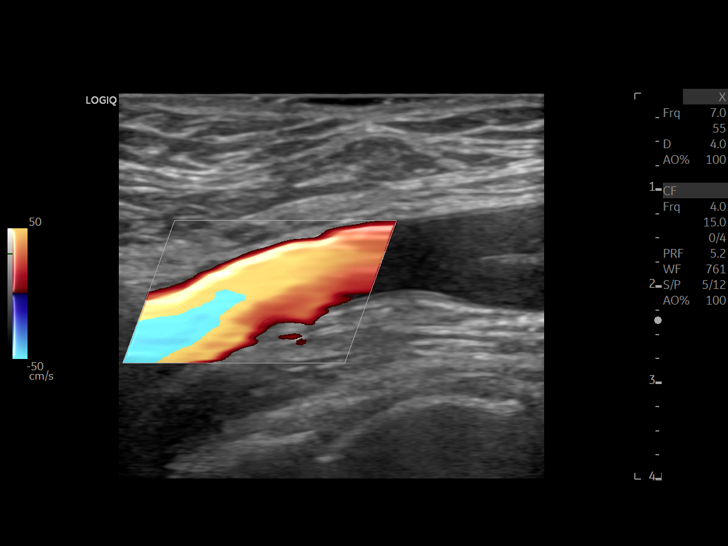
[im 35/58]
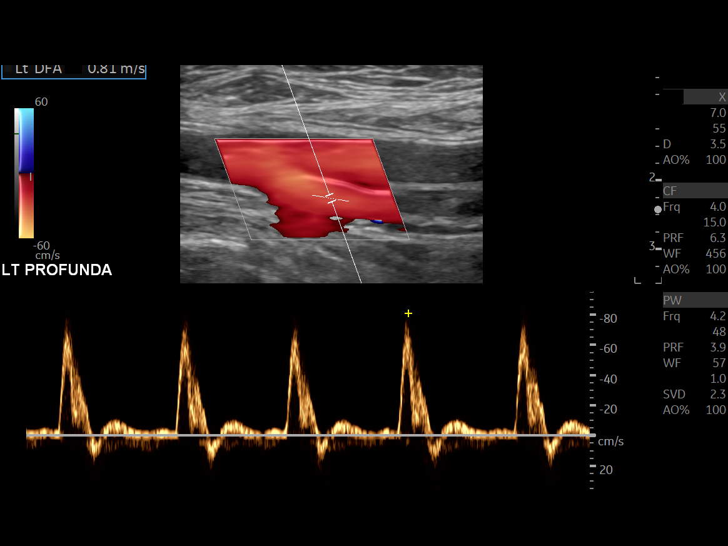
[im 39/58]
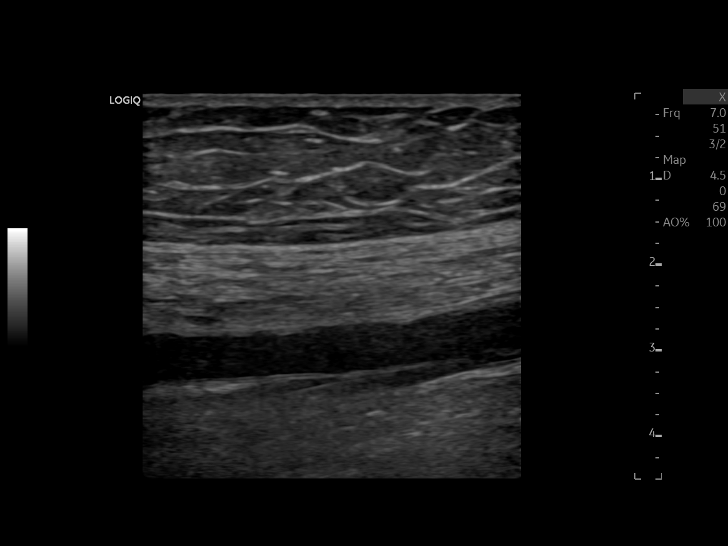
[im 42/58]
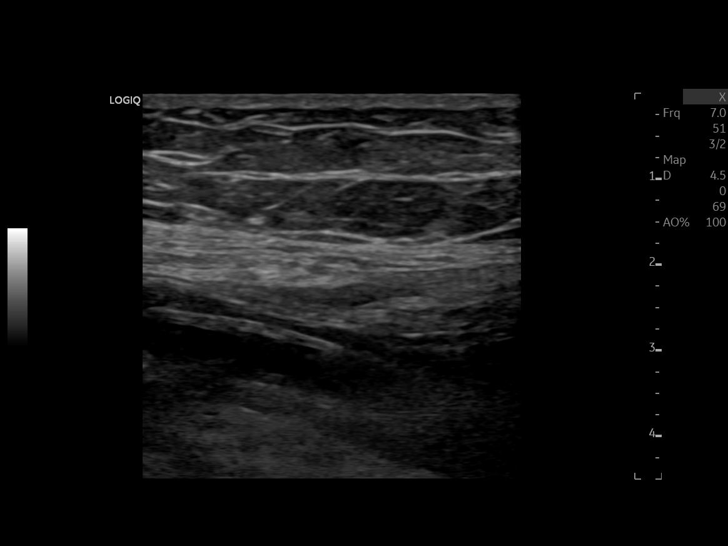
[im 46/58]
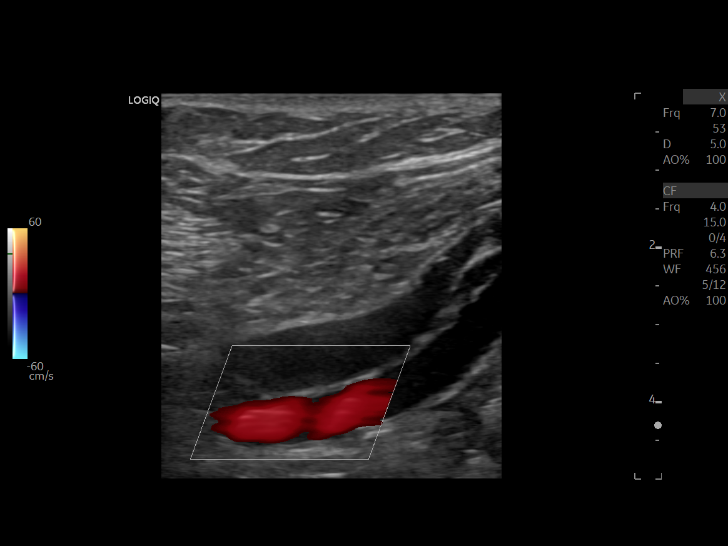
[im 54/58]
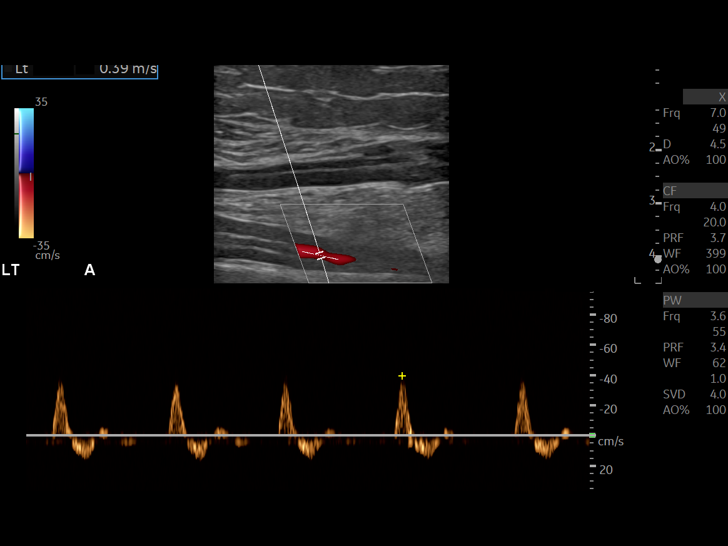
[im 58/58]
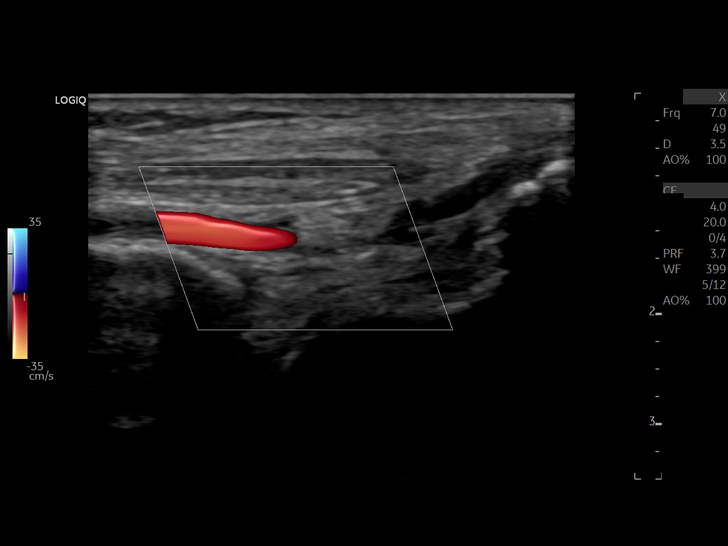

[13 of 16 positions shown; findings below may reference images not displayed]

FINDINGS: The right leg arterial system demonstrates the following waveforms and peak systolic velocities (m/s):
Common femoral artery: TRIPHASIC, 1.41 (m/s)
Profunda femoral artery: TRIPHASIC , 0.75 (m/s)
Superficial femoral artery proximal: TRIPHASIC , 0.99 (m/s)
Superficial femoral artery mid: TRIPHASIC, 0.95 (m/s)
Superficial femoral artery distal: TRIPHASIC , 0.74 (m/s)
Popliteal artery proximal: TRIPHASIC, 0.54 (m/s)
Popliteal artery distal: TRIPHASIC , 0.59 (m/s)
Anterior tibial artery: TRIPHASIC,  0.60 (m/s)
Posterior tibial artery: TRIPHASIC , 0.62 (m/s)
Peroneal artery: TRIPHASIC , 0.45 (m/s)
Dorsalis pedis artery: TRIPHASIC , 0.54 (m/s)
The left leg arterial system demonstrates the following waveforms and peak systolic velocities (m/s):
Common femoral artery: TRIPHASIC, 1.42 (m/s)
Profunda femoral artery: TRIPHASIC , 0.81 (m/s)
Superficial femoral artery proximal: TRIPHASIC, 0.94 (m/s)
Superficial femoral artery mid: TRIPHASIC, 0.95 (m/s)
Superficial femoral artery distal: TRIPHASIC , 0.87 (m/s)
Popliteal artery proximal: TRIPHASIC , 0.55 (m/s)
Popliteal artery distal: TRIPHASIC,  0.65  (m/s)
Anterior tibial artery: TRIPHASIC . 0.77 (m/s)
Posterior tibial artery: TRIPHASIC , 0.48 (m/s)
Peroneal artery: TRIPHASIC , 0.39 (m/s)
Dorsalis pedis artery: TRIPHASIC , 0.61 (m/s)
Cardiac rhythm: REGULAR .
Atherosclerosis (plaque/vascular calcification): Absent.
IMPRESSION: No significant peripheral vascular disease in either leg arterial system.

## 2023-04-11 IMAGING — MG MAMMO DIAG LT W/TOMO
7 series · 8 of 15 positions shown · non-contrast
Comparison: The present examination has been compared to prior imaging studies.

Images Obtained from [facility]
HISTORY: Patient is 51 years old and is seen for diagnostic evaluation of an abnormal mammogram and mass in the left breast at 7 o'clock. The patient has no personal history of cancer. The patient does not
have a first degree relative with breast cancer.
TECHNIQUE: Real-time targeted ultrasound of the left breast was performed. Left 2-D digital diagnostic mammogram was performed followed by 3-D tomosynthesis.
MAMMOGRAM FINDINGS:
There are scattered areas of fibroglandular density.
There is an oval 3 mm circumscribed mass in the medial left breast at posterior depth. This is again seen on repeat mammogram views done today.
ULTRASOUND FINDINGS:
There is no sonographic correlate.

[L CC (1 of 3)]
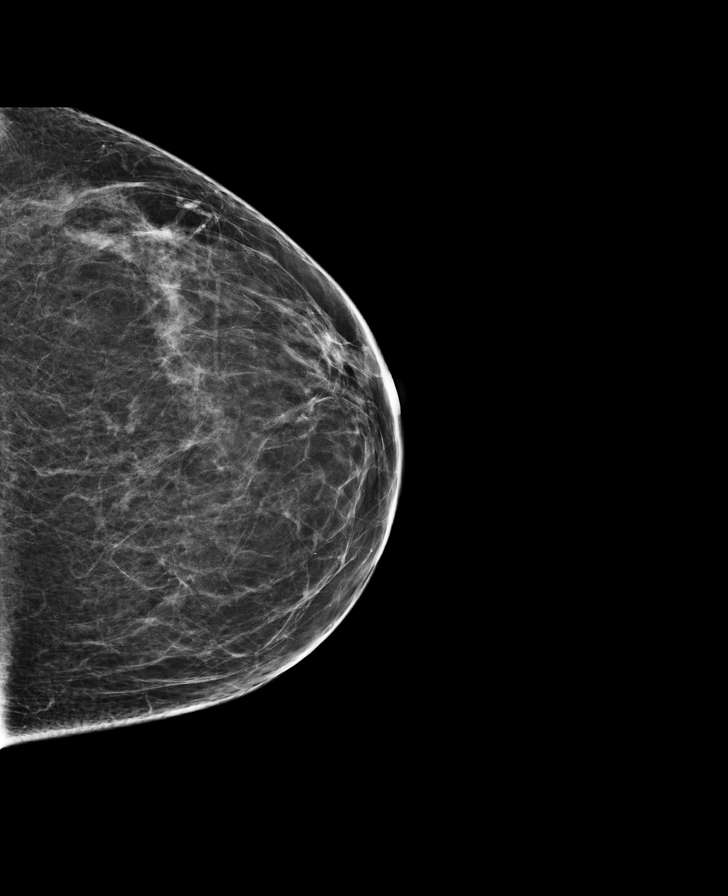

[L MLO (1 of 2)]
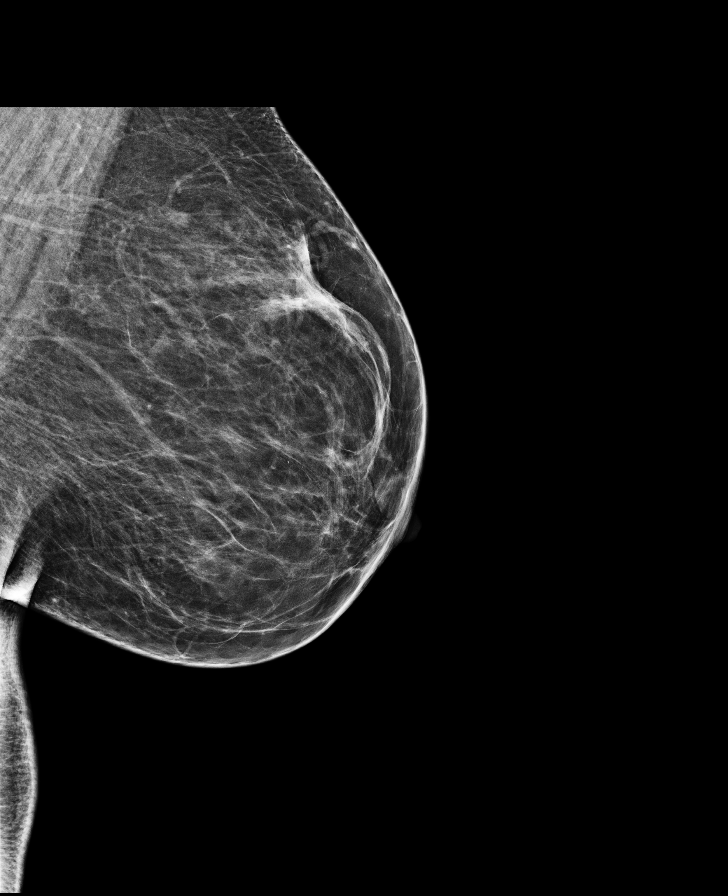

[L MLO tomo · 2 of 67 frames shown]
[frame 22/67]
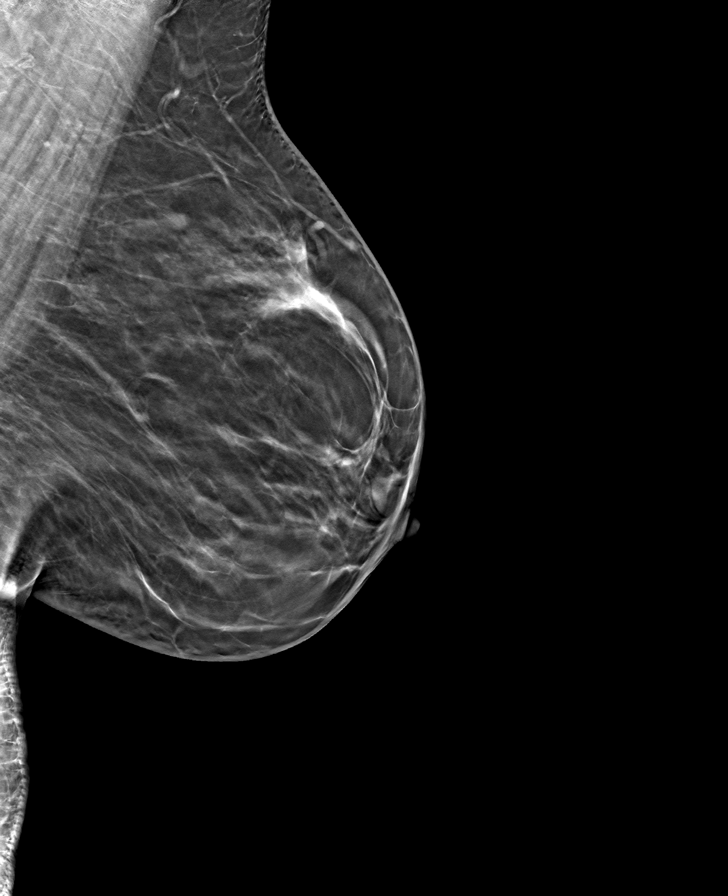
[frame 34/67]
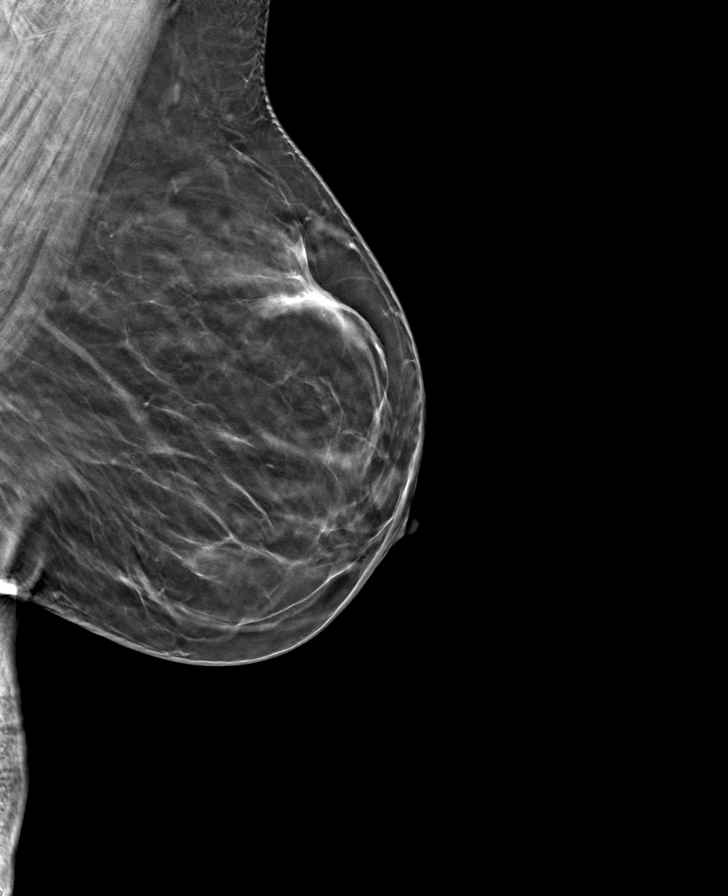

[L CC tomo · tomo slice 30/59.0]
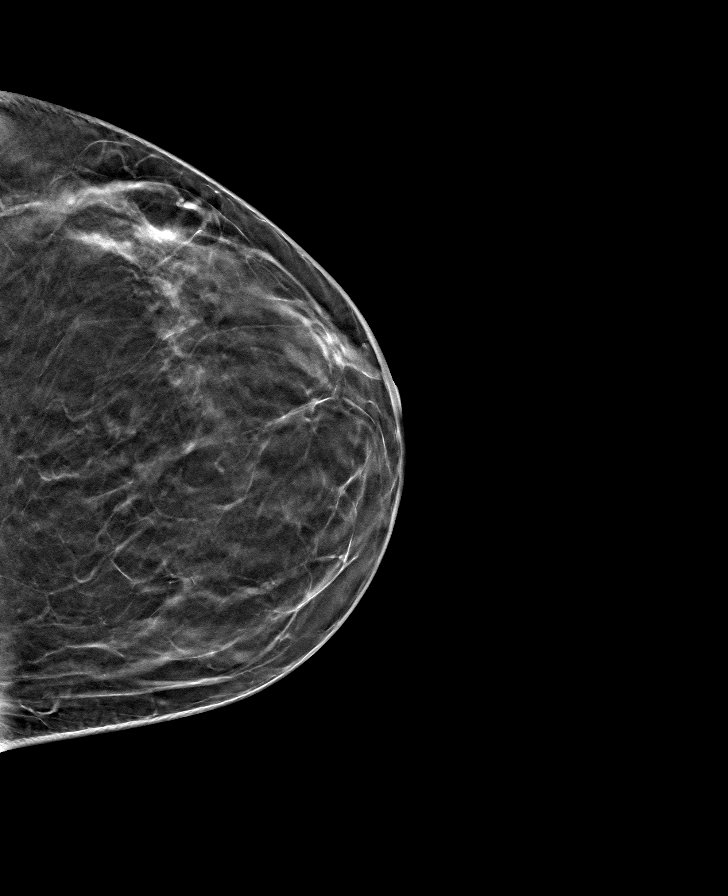

[L CC (2 of 3)]
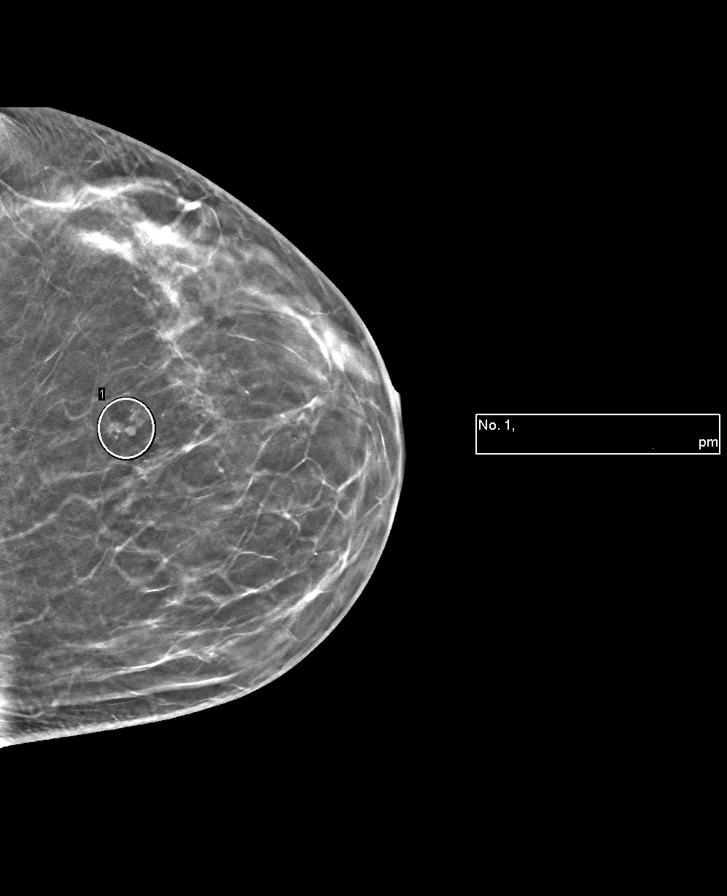

[L CC (3 of 3)]
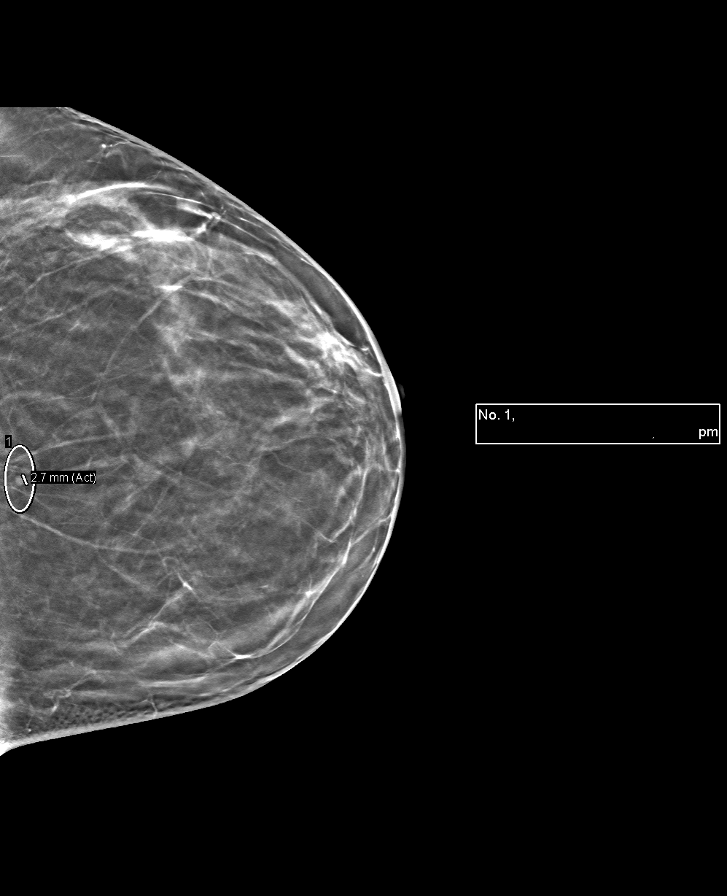

[L MLO (2 of 2)]
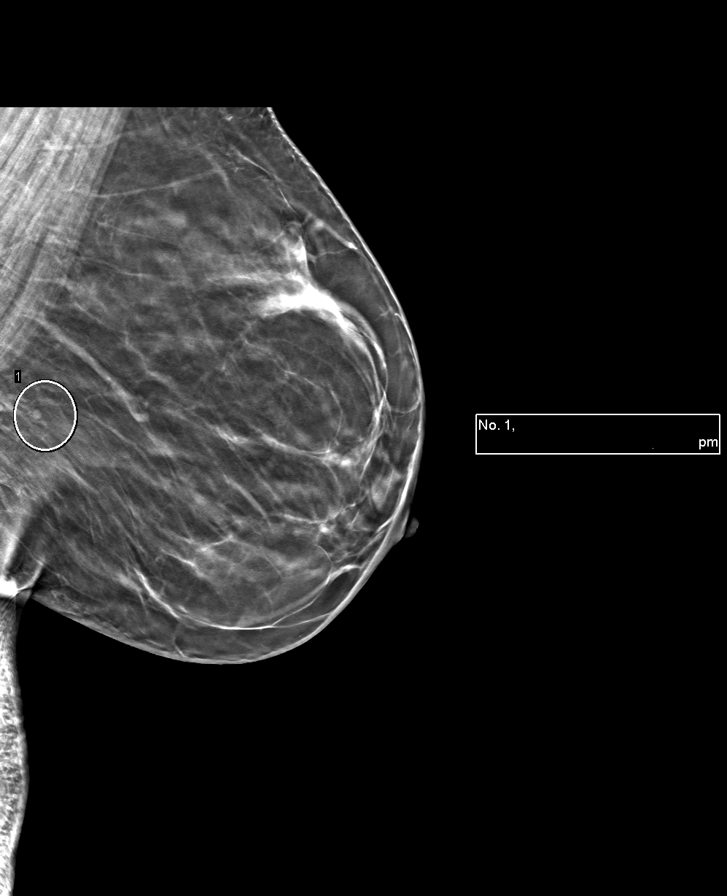

[8 of 15 positions shown; findings below may reference images not displayed]

IMPRESSION: 3 mm circumscribed mass in the left breast on mammogram is probably benign, perhaps a small intramammary lymph node. Follow-up diagnostic left breast mammogram in 6 months is recommended.
Findings were addressed and documented. The patient was given a copy of her results at the time of her visit.
BI-RADS Category 3: Probably benign

## 2023-04-11 IMAGING — US US BREAST LT LTD
1 series · 10 of 10 positions shown · non-contrast
Comparison: The present examination has been compared to prior imaging studies.

Images Obtained from [facility]
HISTORY: Patient is 51 years old and is seen for diagnostic evaluation of an abnormal mammogram and mass in the left breast at 7 o'clock. The patient has no personal history of cancer. The patient does not
have a first degree relative with breast cancer.
TECHNIQUE: Real-time targeted ultrasound of the left breast was performed. Left 2-D digital diagnostic mammogram was performed followed by 3-D tomosynthesis.
MAMMOGRAM FINDINGS:
There are scattered areas of fibroglandular density.
There is an oval 3 mm circumscribed mass in the medial left breast at posterior depth. This is again seen on repeat mammogram views done today.
ULTRASOUND FINDINGS:
There is no sonographic correlate.

[Series 1: us breast left ltd · 10 of 10 slices shown]
[im 1/10]
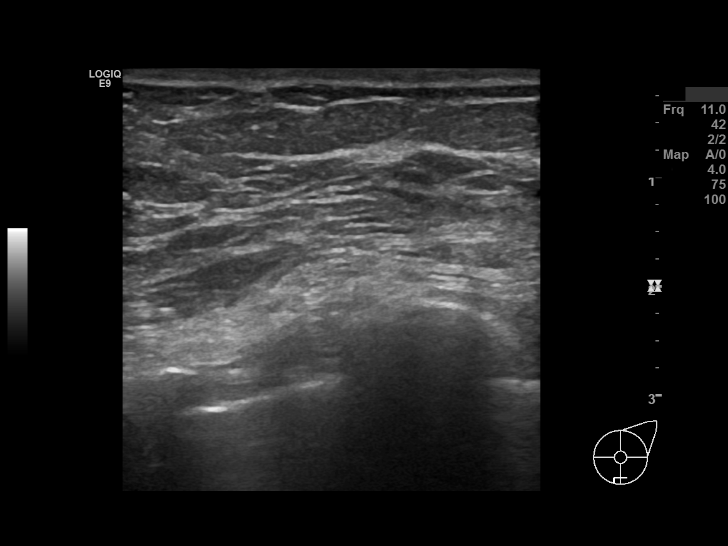
[im 2/10]
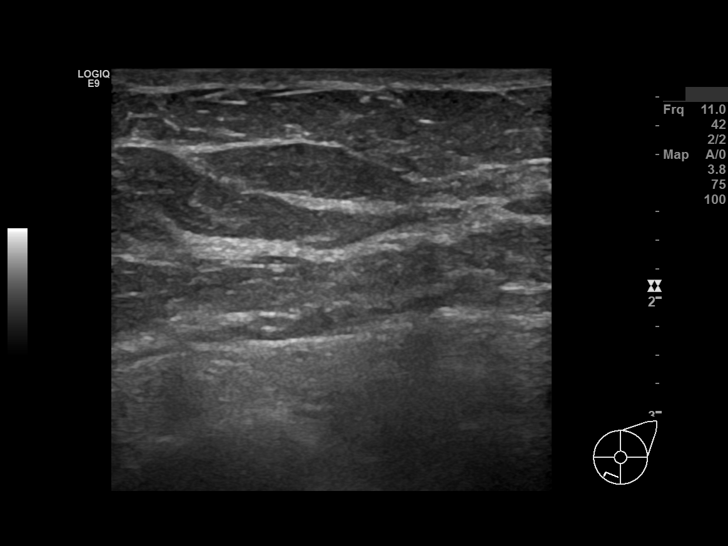
[im 3/10]
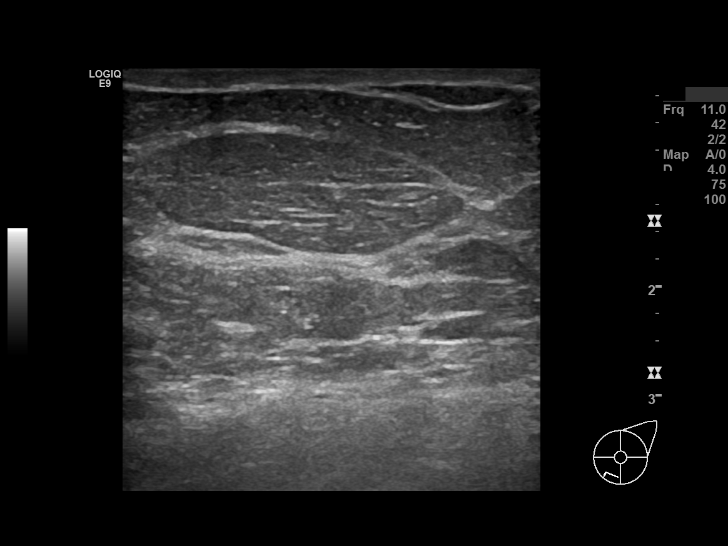
[im 4/10]
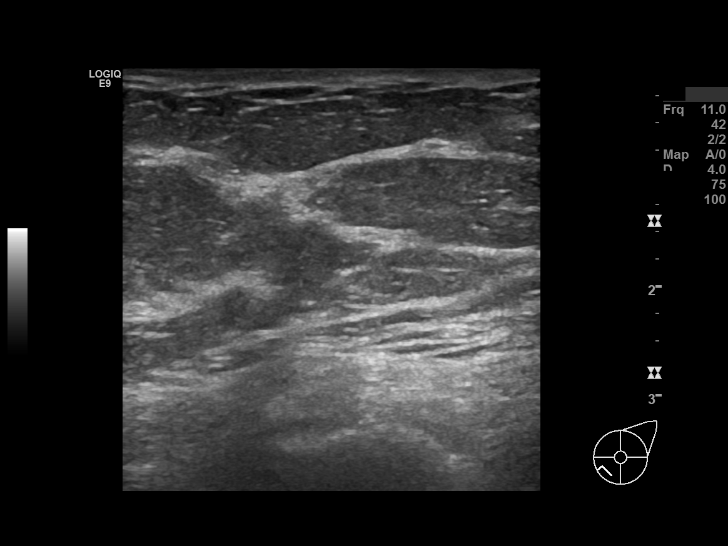
[im 5/10]
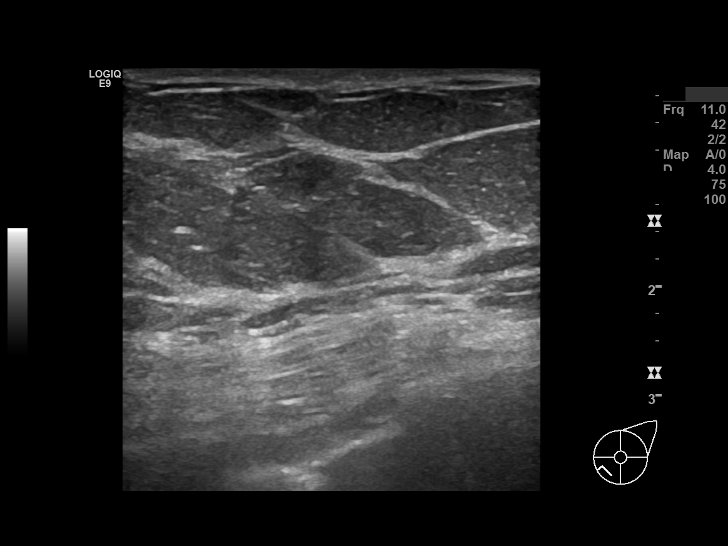
[im 6/10]
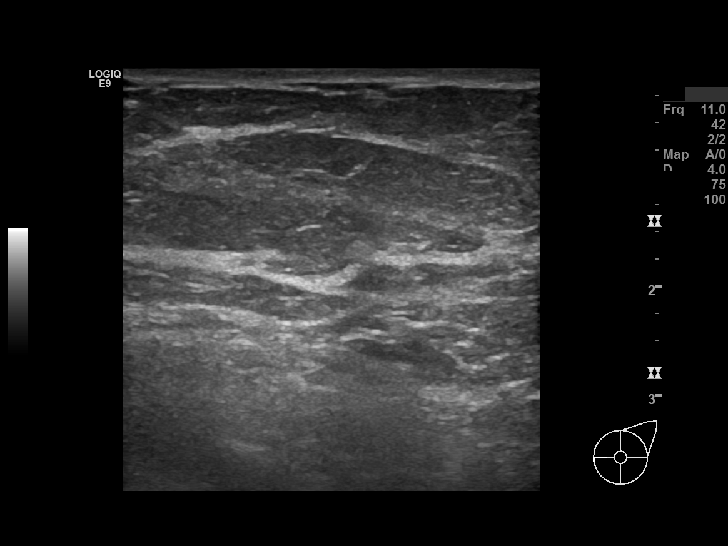
[im 7/10]
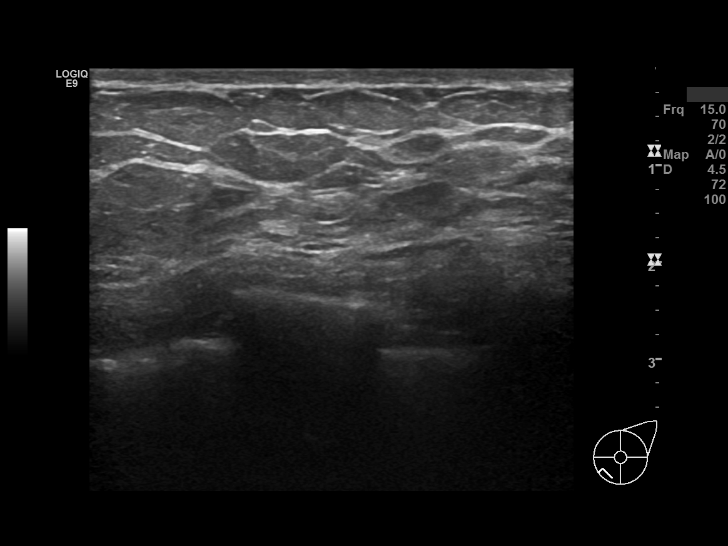
[im 8/10]
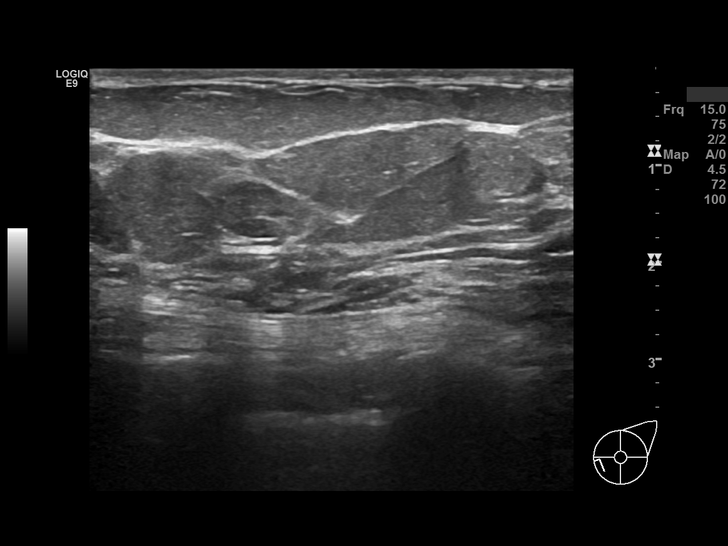
[im 9/10]
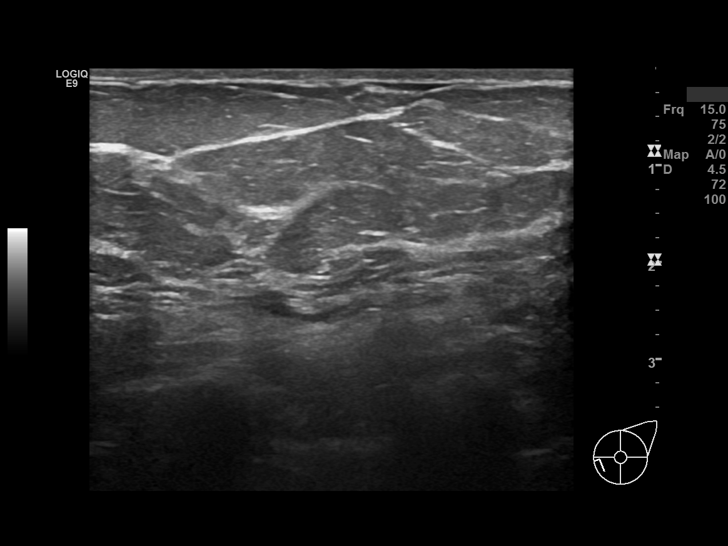
[im 10/10]
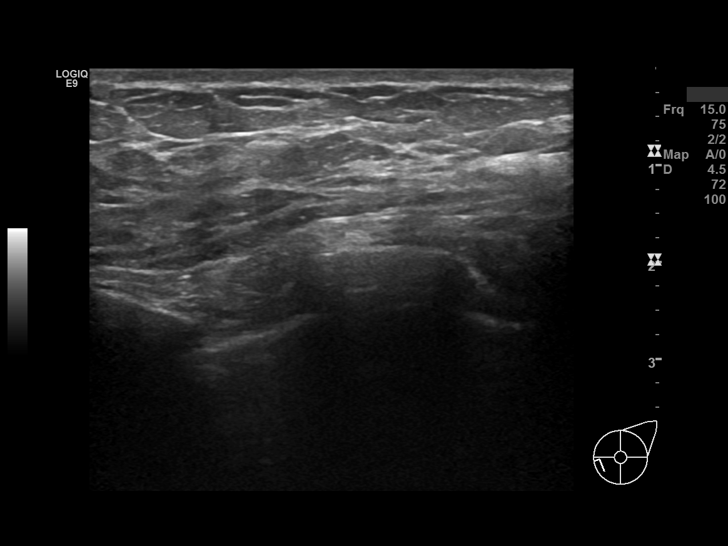

[10 of 10 positions shown; findings below may reference images not displayed]

IMPRESSION: 3 mm circumscribed mass in the left breast on mammogram is probably benign, perhaps a small intramammary lymph node. Follow-up diagnostic left breast mammogram in 6 months is recommended.
Findings were addressed and documented. The patient was given a copy of her results at the time of her visit.
BI-RADS Category 3: Probably benign

## 2023-06-13 IMAGING — MR MRI CSPINE WO/W CONTRAST
10 series · 48 of 48 positions shown · IV contrast (15 ml prohance)
Comparison: MRI cervical spine study 03/07/2023.

Images Obtained from [facility]
HISTORY: 52 years-old Female with cervical radiculopathy, multiple sclerosis.
TECHNIQUE: MRI study of the cervical spine was performed first without contrast followed by IV administration of 15 mL ProHance. Sagittal and axial images of varying sequences were obtained.

[Series 1: bSSFP · axial · 6.0mm · 1.17mm/px · z∈[-15,+148]mm · 3 of 22 slices shown]
[im 1/22]
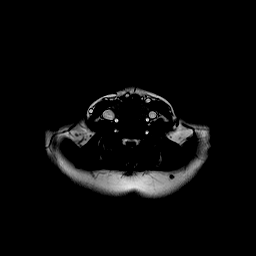
[im 11/22]
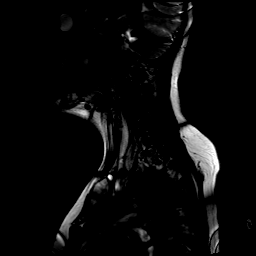
[im 22/22]
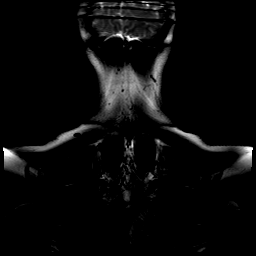

[Series 3: t1_sag · sagittal · 3.0mm · 0.66mm/px · 2 of 15 slices shown]
[im 1/15]
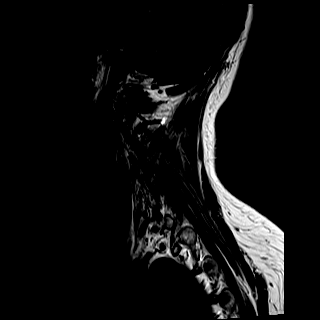
[im 15/15]
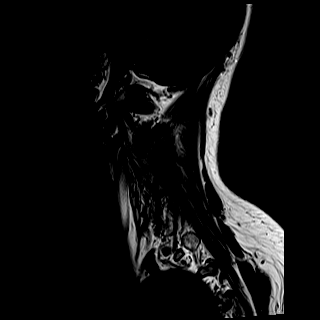

[Series 4: ir_sag · sagittal · 3.0mm · 0.82mm/px · 2 of 15 slices shown]
[im 1/15]
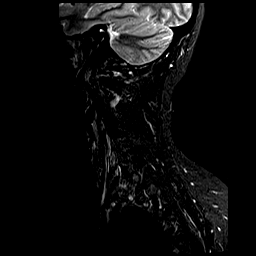
[im 15/15]
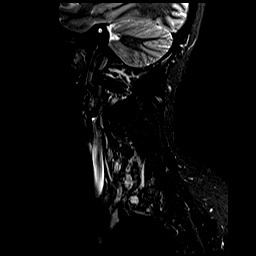

[Series 5: t2_medic_axial · axial · 3.0mm · 0.70mm/px · z∈[-78,+56]mm · 7 of 36 slices shown]
[im 1/36]
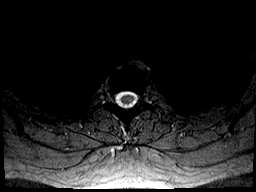
[im 6/36]
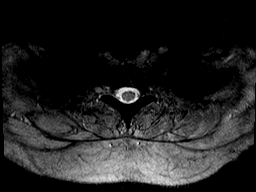
[im 12/36]
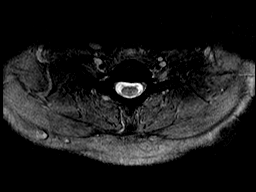
[im 18/36]
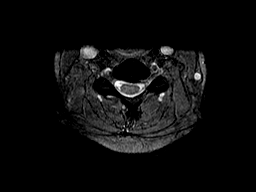
[im 24/36]
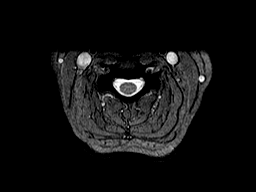
[im 30/36]
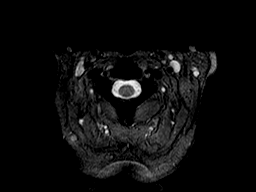
[im 36/36]
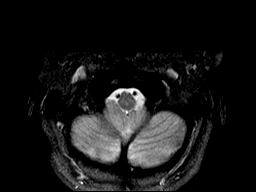

[Series 6: t2_axial · axial · 3.0mm · 0.56mm/px · z∈[-85,+49]mm · 7 of 36 slices shown]
[im 1/36]
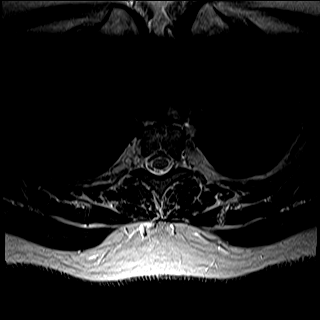
[im 6/36]
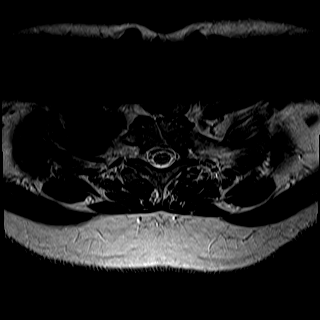
[im 12/36]
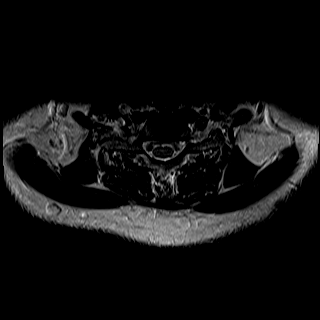
[im 18/36]
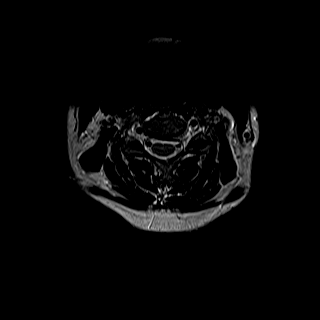
[im 24/36]
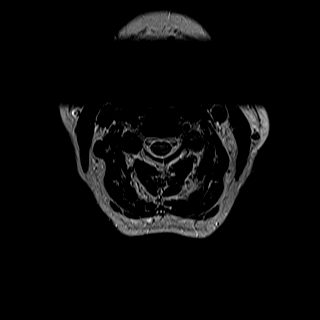
[im 30/36]
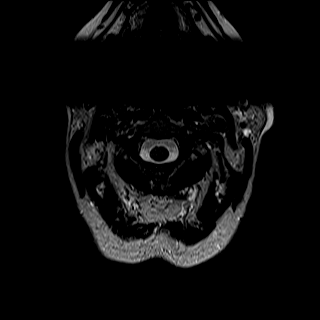
[im 36/36]
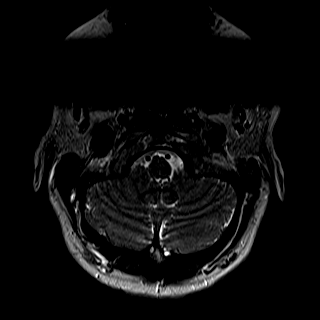

[Series 7: t2_sag · sagittal · 3.0mm · 0.55mm/px · 3 of 15 slices shown]
[im 1/15]
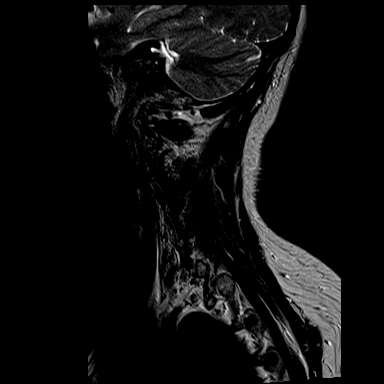
[im 8/15]
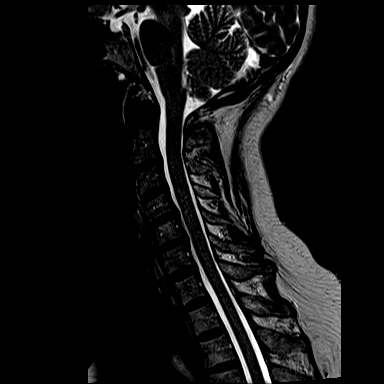
[im 15/15]
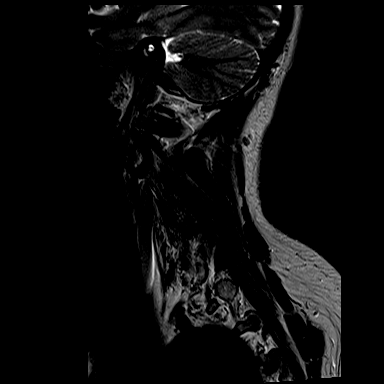

[Series 8: t1_axial_fs_pre · axial · 3.0mm · 0.70mm/px · z∈[-82,+52]mm · 7 of 36 slices shown]
[im 1/36]
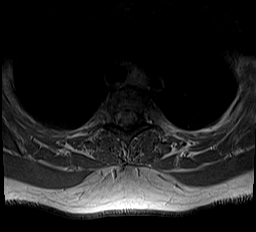
[im 6/36]
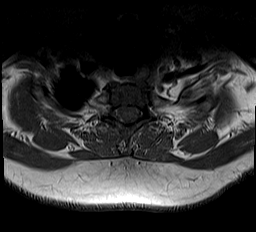
[im 12/36]
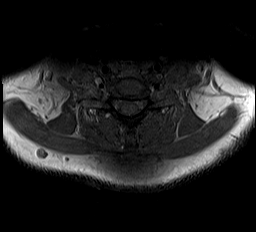
[im 18/36]
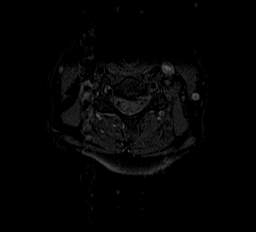
[im 24/36]
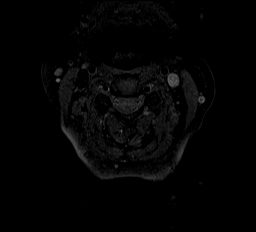
[im 30/36]
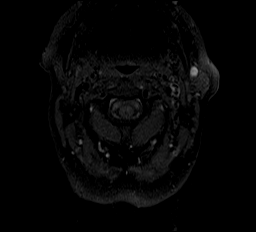
[im 36/36]
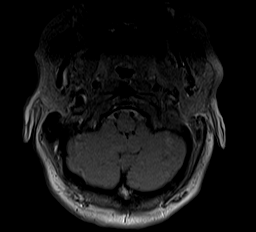

[Series 9: t1_axial_fs_+c · axial · 3.0mm · 0.70mm/px · z∈[-82,+52]mm · 7 of 36 slices shown]
[im 1/36]
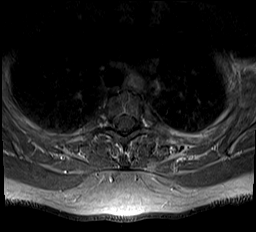
[im 6/36]
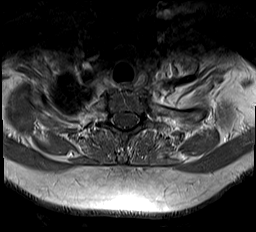
[im 12/36]
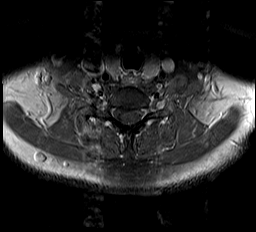
[im 18/36]
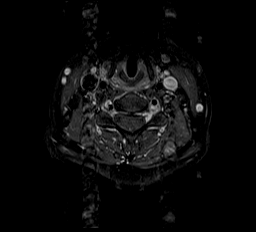
[im 24/36]
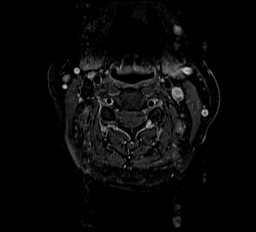
[im 30/36]
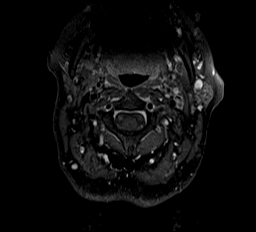
[im 36/36]
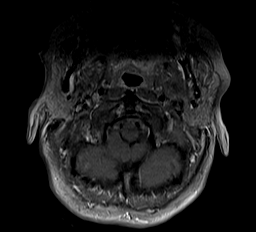

[Series 10: sub_s9-s8_axial · axial · 3.0mm · 0.70mm/px · z∈[-82,+52]mm · 7 of 36 slices shown]
[im 1/36]
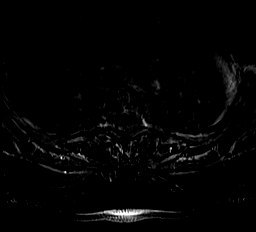
[im 6/36]
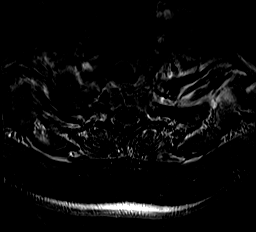
[im 12/36]
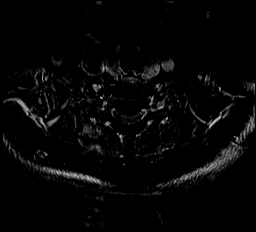
[im 18/36]
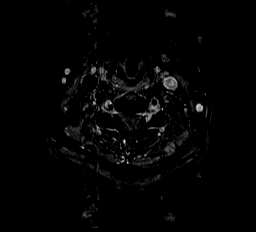
[im 24/36]
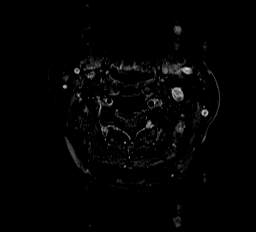
[im 30/36]
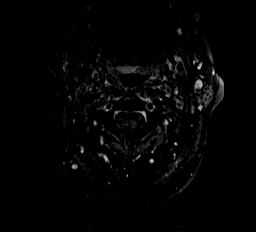
[im 36/36]
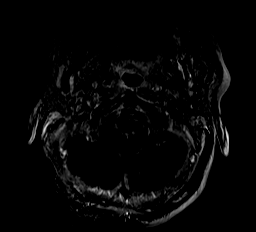

[Series 11: t1_sag_fs_+c · sagittal · 3.0mm · 0.82mm/px · 3 of 15 slices shown]
[im 1/15]
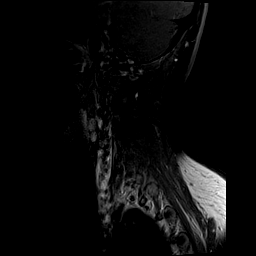
[im 8/15]
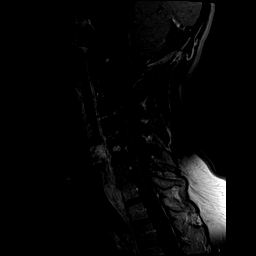
[im 15/15]
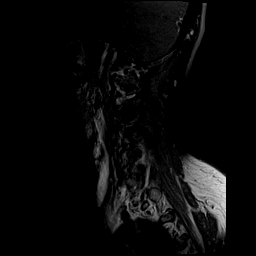

[48 of 48 positions shown; findings below may reference images not displayed]

FINDINGS: Vertebral body height and alignment: The vertebral body height and alignment are within normal limits.
Cervical cord and posterior fossa: The visualized portion of the cervical cord and posterior fossa are within normal limits.
Marrow signal: Mild superior endplate marrow edema enhancement at C5 level. Mild endplate reactive marrow changes favored.
Degenerative disc changes: Mild degenerative disc changes identified at C4-5 and C5-6 levels.
C2-3 level: Disc bulge. Patent spinal canal and bilateral foramina.
C3-4 level: Patent spinal canal and bilateral foramina.
C4-5 level: Disc bulge. Mild right foraminal stenosis. Patent left foramen.
C5-6 level: Left paracentral disc protrusion. Mild spinal canal stenosis. Both foramina are patent.
C6-7 level: Patent spinal canal. Neural foramina are patent.
C7-T1 level: Patent spinal canal. Neural foramina are patent..
IMPRESSION: 1.  Mild right C4-5 foraminal stenosis.
2.  Mild C5-6 spinal canal stenosis.
3.  Mild degenerative disc changes present at C4-5 and C5-6 levels.
4.  Mild superior endplate marrow edema and enhancement identified at C5 level. Mild endplate reactive marrow changes favored.
5.  The cervical cord demonstrates normal signal. No evidence of multiple sclerosis.

## 2023-09-18 IMAGING — MG MAMMO DIAG LT W/CAD TOMO
4 series · 4 of 12 positions shown · non-contrast
Comparison: The present examination has been compared to prior imaging studies.

Images Obtained from [facility]
HISTORY: Patient is 52 years old and is seen for diagnostic evaluation of a lump in the lower inner quadrant of the left breast. The patient has no personal history of cancer. The patient does not have a
first degree relative with breast cancer.
TECHNIQUE: Real-time targeted ultrasound of the left breast was performed. Left 2-D digital diagnostic mammogram was performed followed by 3-D tomosynthesis.  Current study was also evaluated with a computer
aided detection (CAD) system.
MAMMOGRAM FINDINGS:
Category B: There are scattered areas of fibroglandular density.
There is a small stable circumscribed oval mass measuring 3 mm in the left breast at 6 o'clock at posterior depth.
ULTRASOUND FINDINGS:
There are similar appearing adjacent mixed echotexture oval avascular areas in the left breast at 7 and [DATE] located 4 cm from the nipple corresponding to an area of palpable concern. These measure
16 x 6 x 13 mm and 21 x 6 x 17 mm respectively. They are partially hyperechoic with small cystic spaces resembling areas of evolving hematoma and/or fat necrosis.

[L MLO]
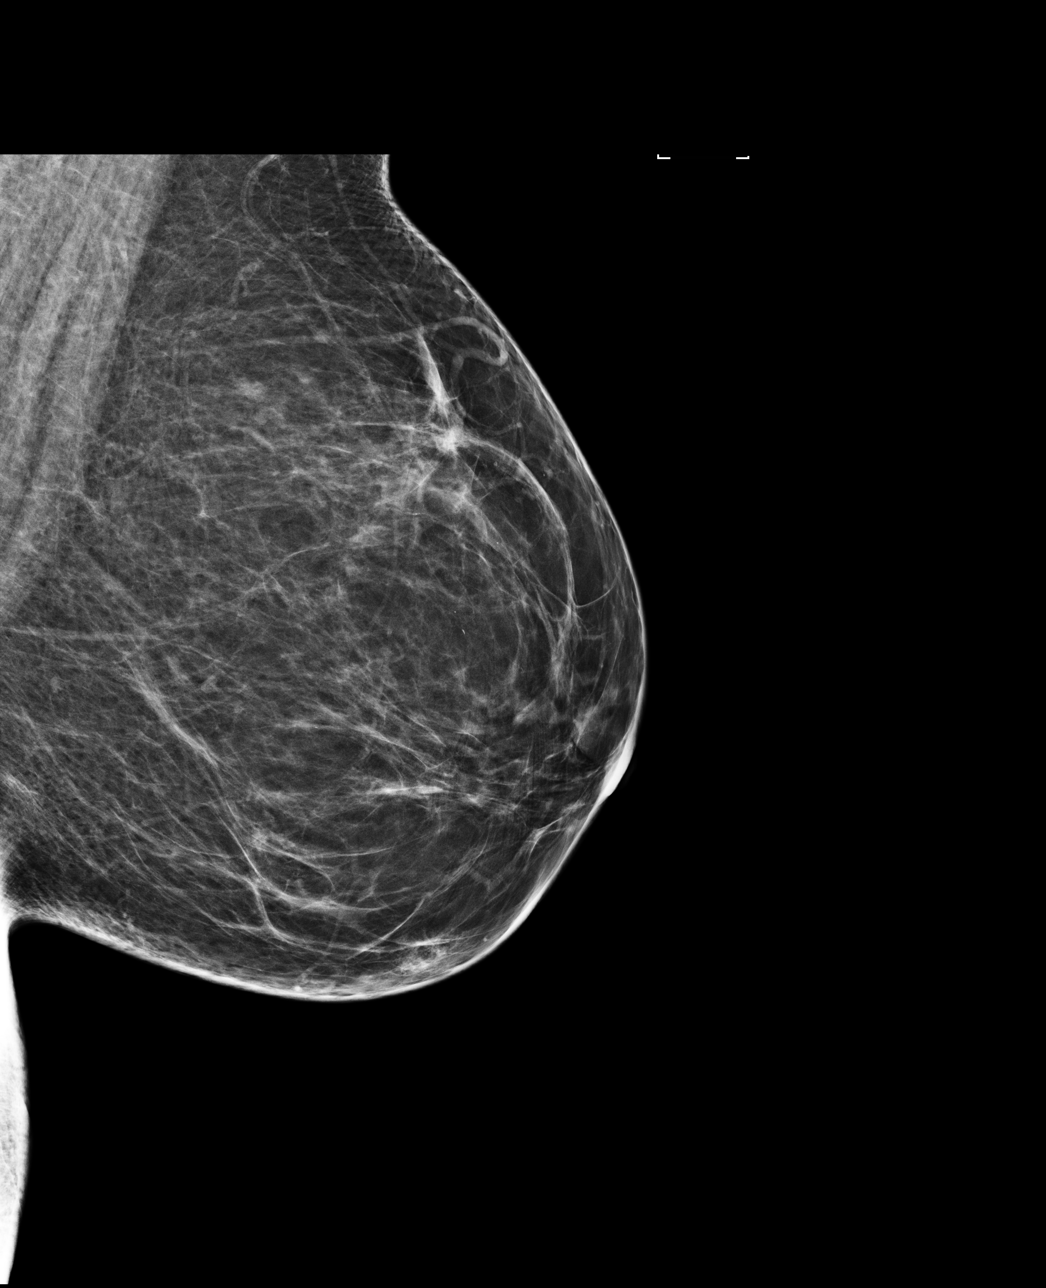

[L CC]
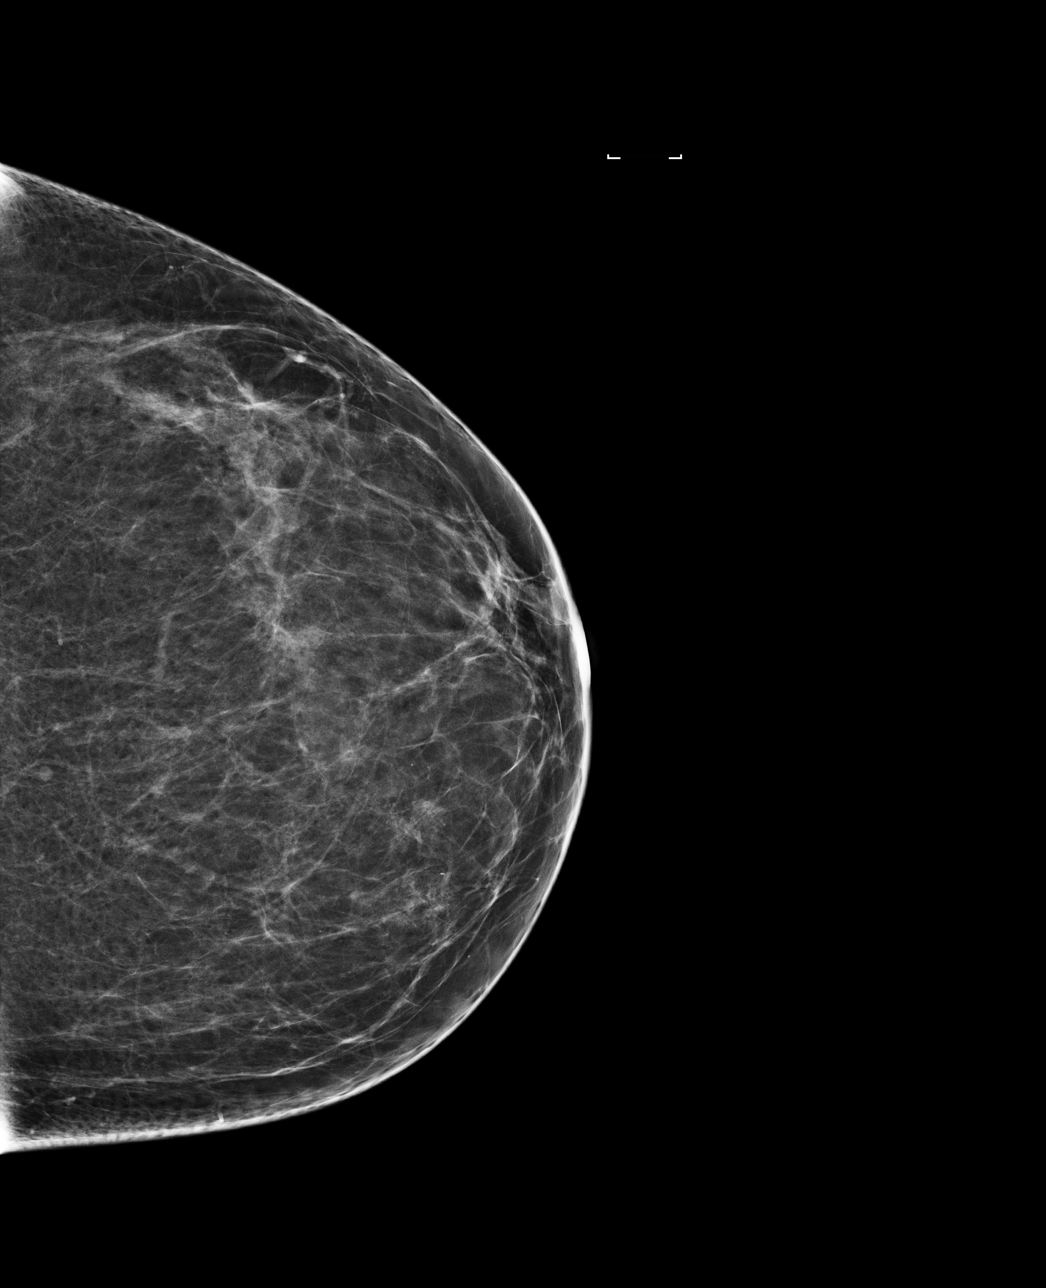

[L CC tomo · tomo slice 31/61.0]
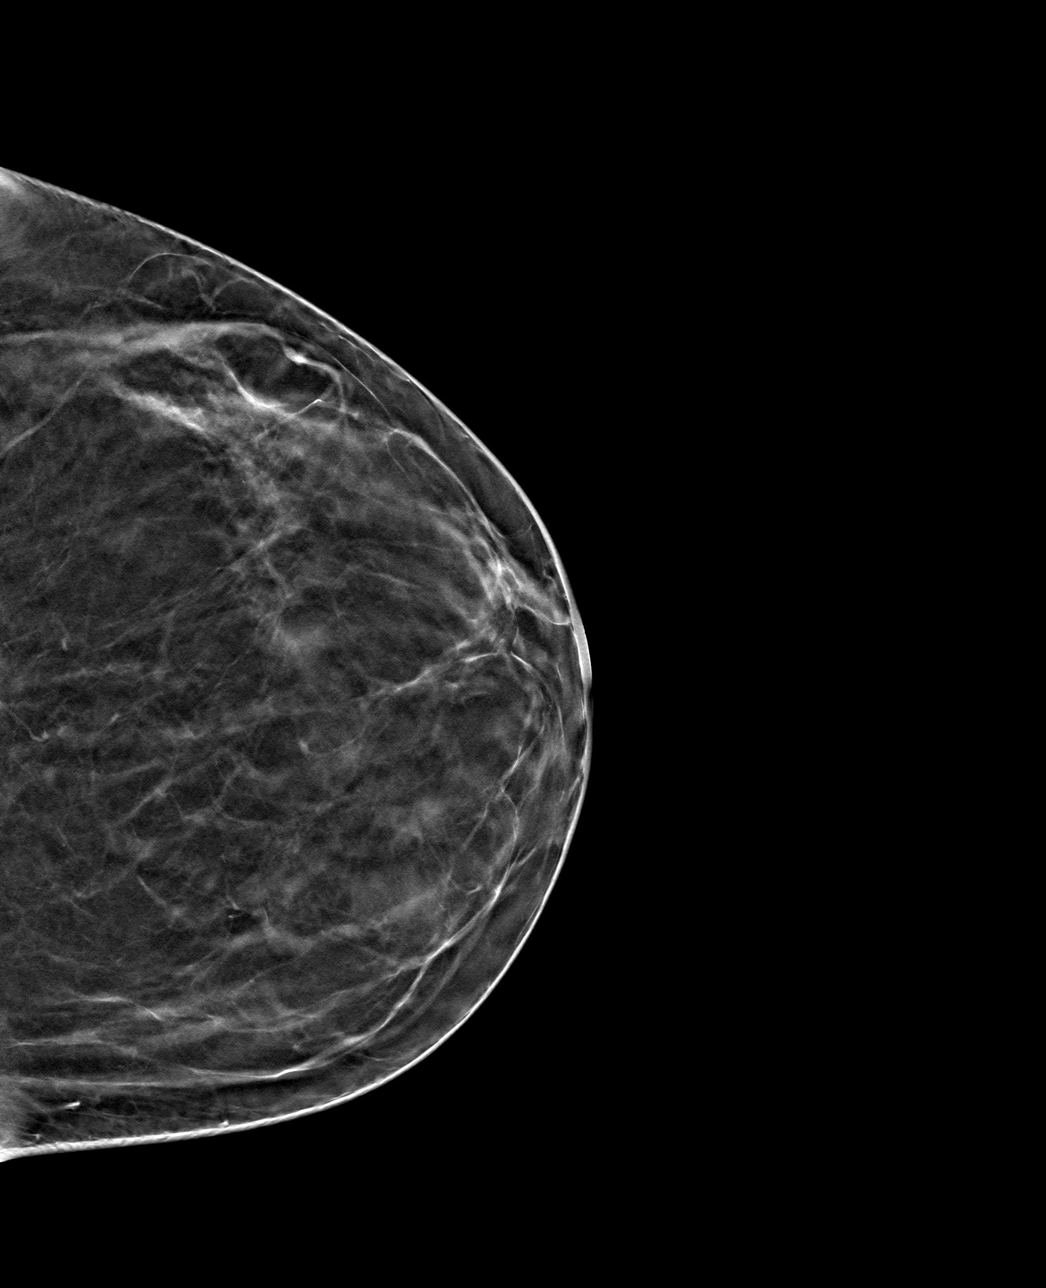

[L MLO tomo · tomo slice 35/70.0]
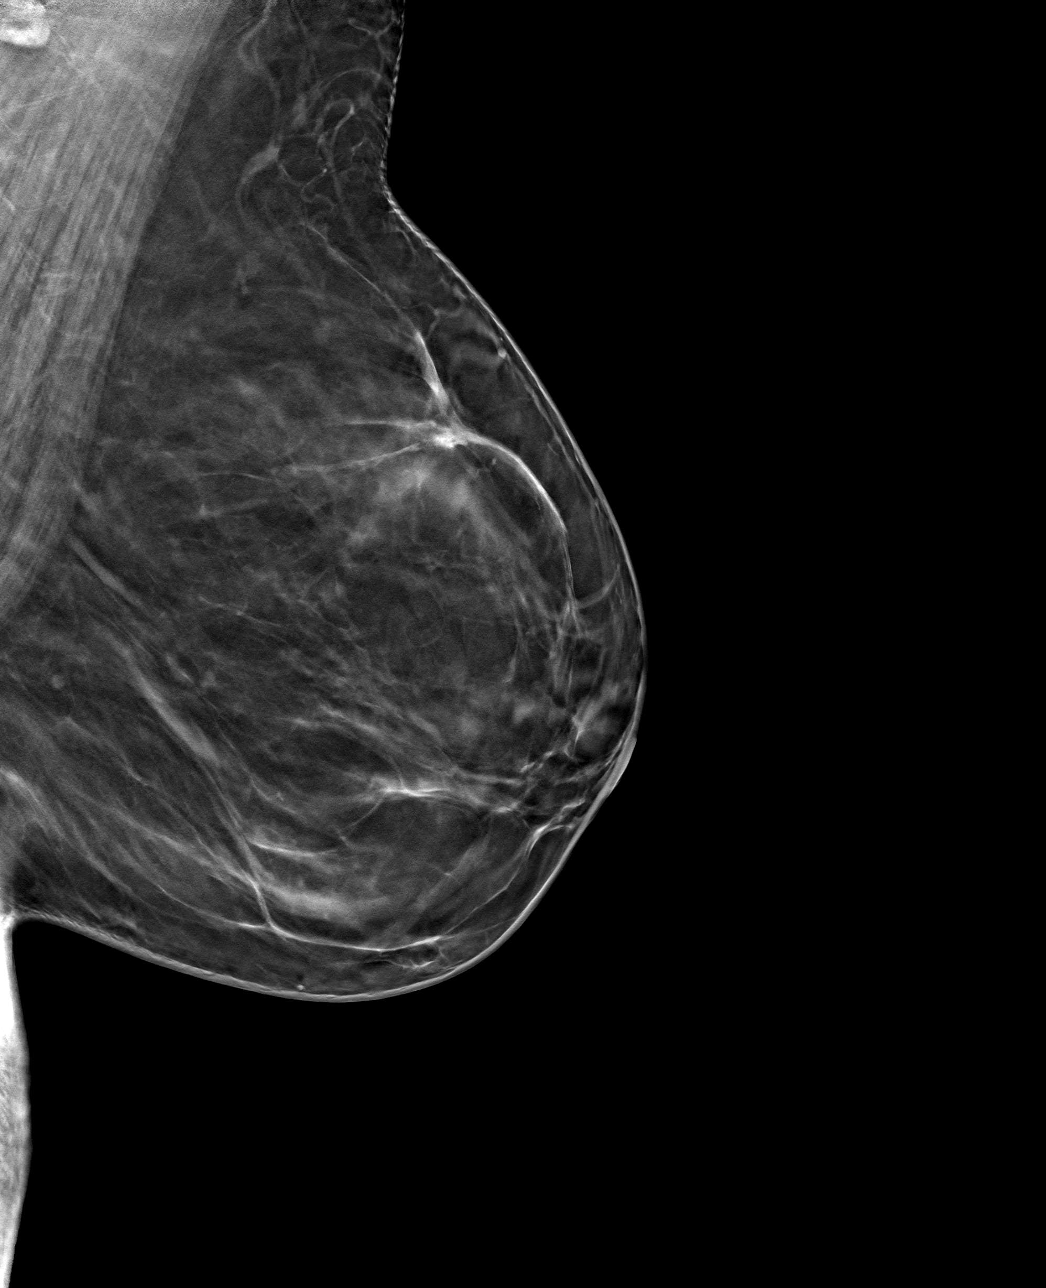

[4 of 12 positions shown; findings below may reference images not displayed]

IMPRESSION: 1.  Similar appearing adjacent mixed echotexture areas in the lower inner quadrant of the left breast are probably benign and may represent areas of evolving hematoma and/or fat necrosis. Recommend
follow-up left breast ultrasound in 3 months.
2.  Tiny stable nodule in the left breast on mammogram is probably benign. Recommend follow-up diagnostic mammogram in 6 months. At that time the patient would also be due for her right breast
3.  Findings were addressed and documented. The patient was given a copy of the results at the time of the visit.
BI-RADS Category 3: Probably benign

## 2023-09-18 IMAGING — US US BREAST LT LTD
1 series · 15 of 15 positions shown · non-contrast
Comparison: The present examination has been compared to prior imaging studies.

Images Obtained from [facility]
HISTORY: Patient is 52 years old and is seen for diagnostic evaluation of a lump in the lower inner quadrant of the left breast. The patient has no personal history of cancer. The patient does not have a
first degree relative with breast cancer.
TECHNIQUE: Real-time targeted ultrasound of the left breast was performed. Left 2-D digital diagnostic mammogram was performed followed by 3-D tomosynthesis.  Current study was also evaluated with a computer
aided detection (CAD) system.
MAMMOGRAM FINDINGS:
Category B: There are scattered areas of fibroglandular density.
There is a small stable circumscribed oval mass measuring 3 mm in the left breast at 6 o'clock at posterior depth.
ULTRASOUND FINDINGS:
There are similar appearing adjacent mixed echotexture oval avascular areas in the left breast at 7 and [DATE] located 4 cm from the nipple corresponding to an area of palpable concern. These measure
16 x 6 x 13 mm and 21 x 6 x 17 mm respectively. They are partially hyperechoic with small cystic spaces resembling areas of evolving hematoma and/or fat necrosis.

[Series 1: us breast left ltd · 15 of 15 slices shown]
[im 1/15]
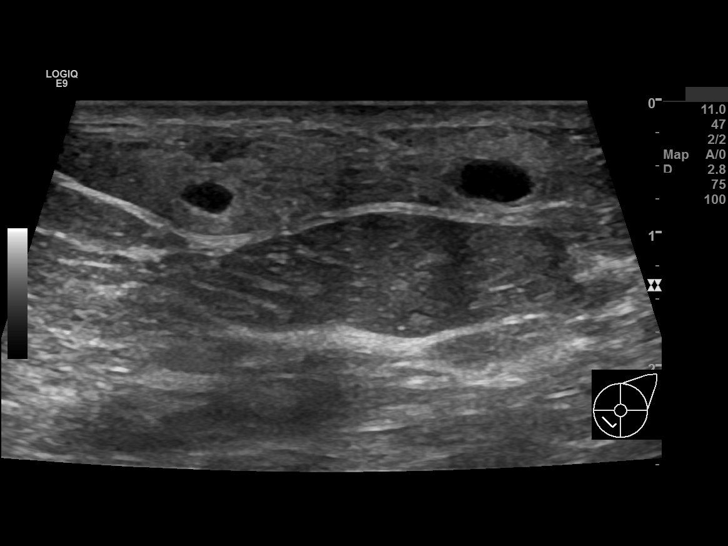
[im 2/15]
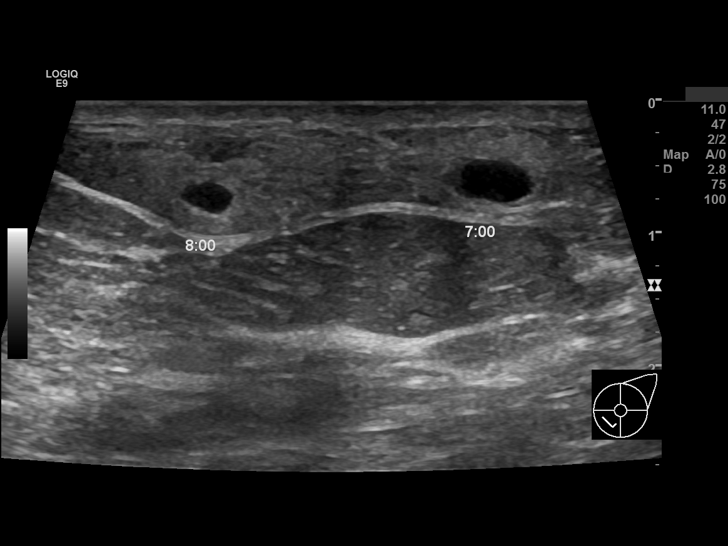
[im 3/15]
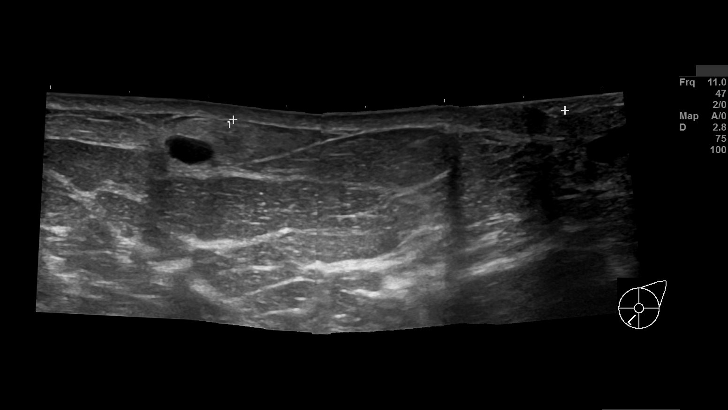
[im 4/15]
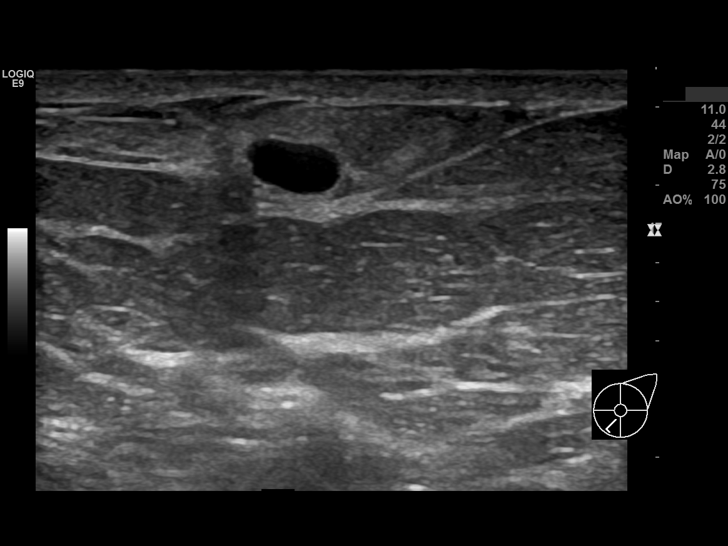
[im 5/15]
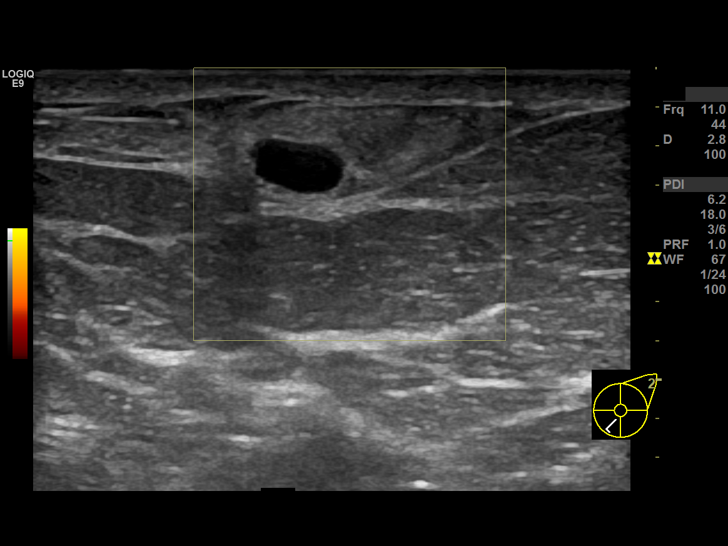
[im 6/15]
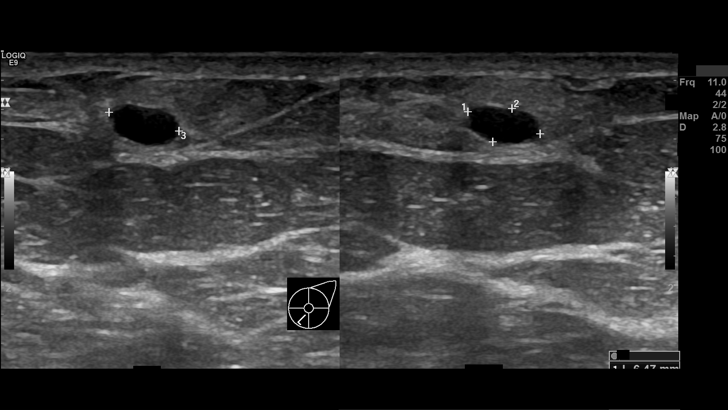
[im 7/15]
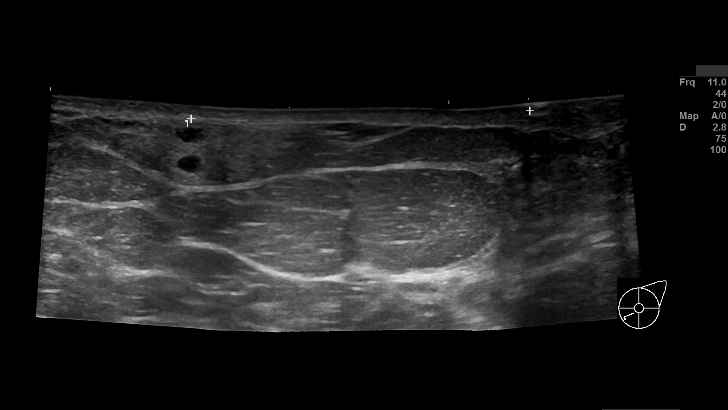
[im 8/15]
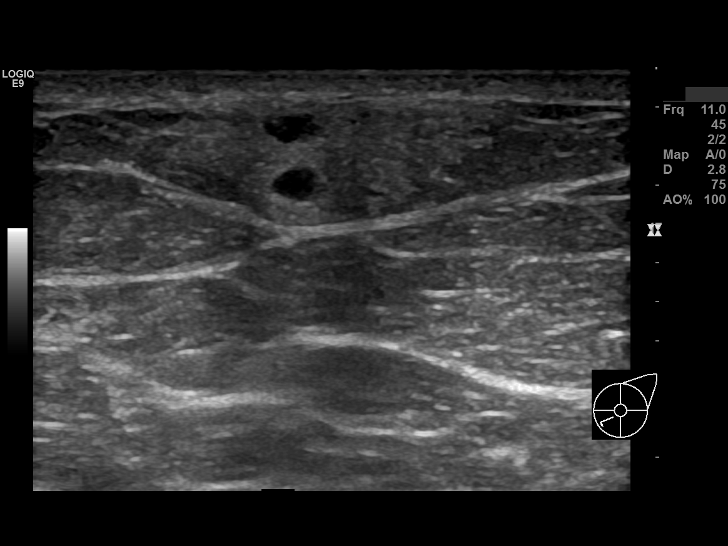
[im 9/15]
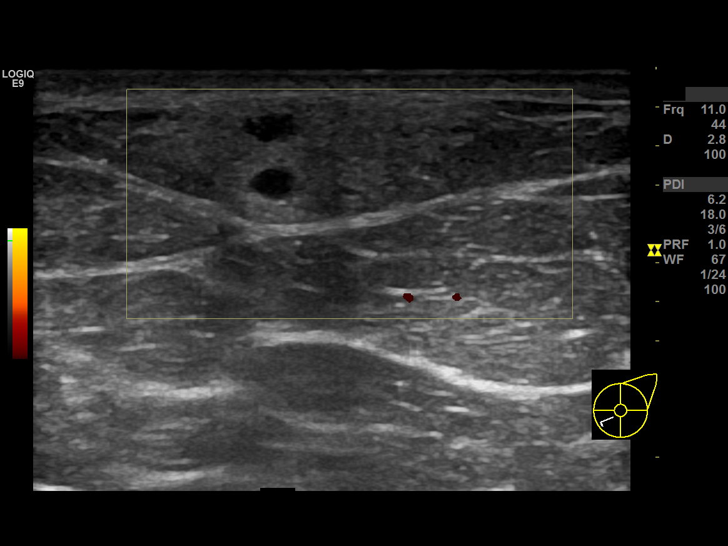
[im 10/15]
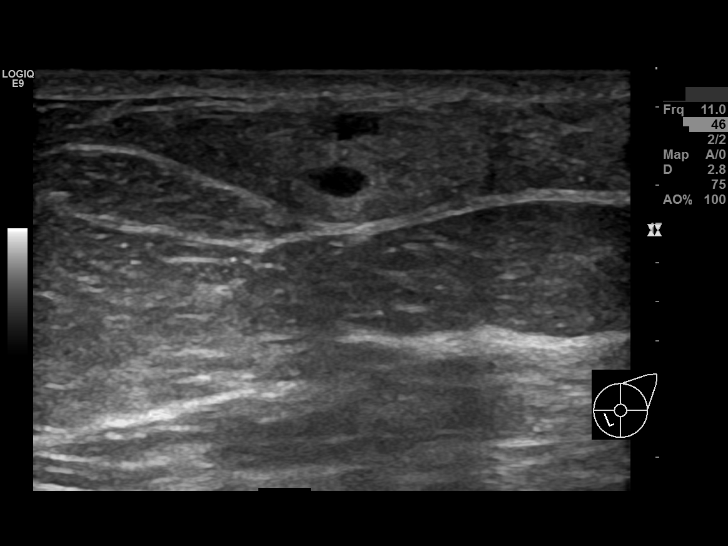
[im 11/15]
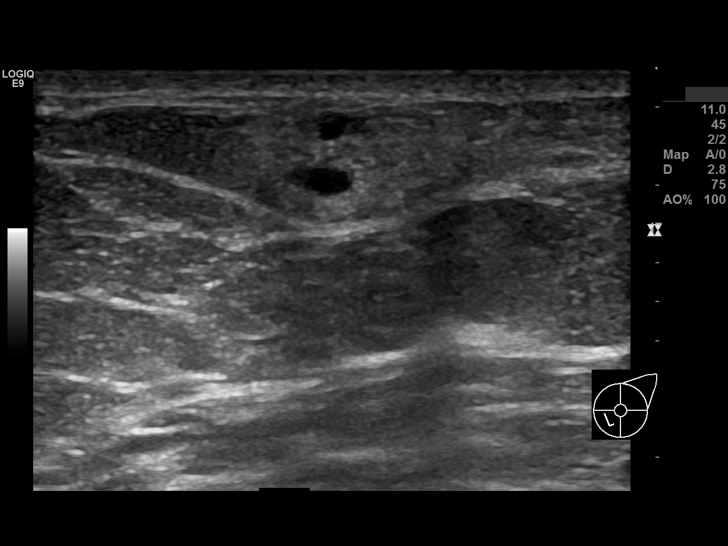
[im 12/15]
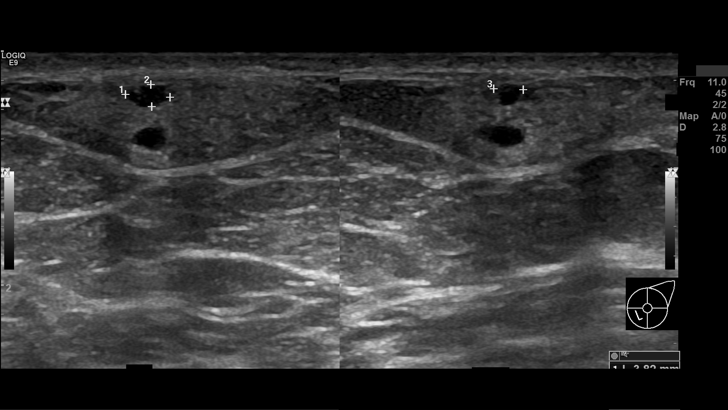
[im 13/15]
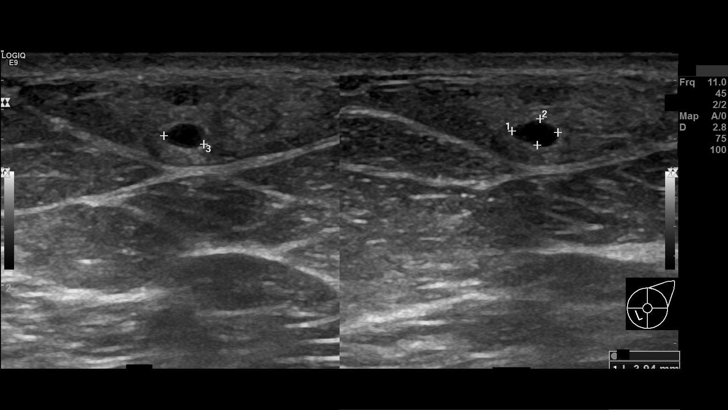
[im 14/15]
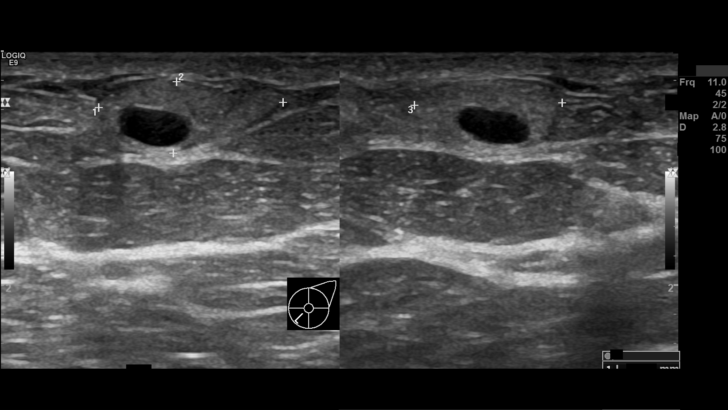
[im 15/15]
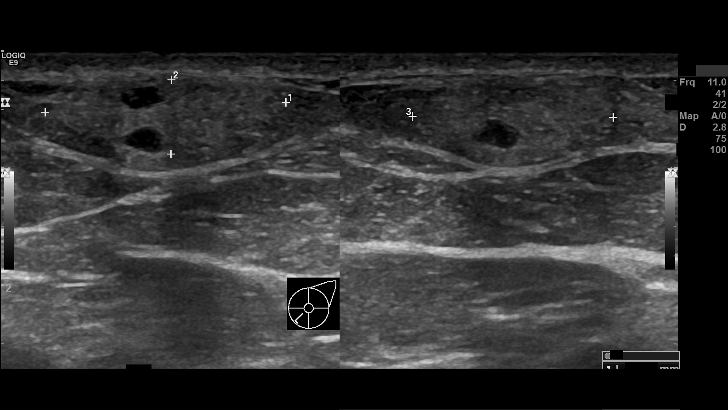

[15 of 15 positions shown; findings below may reference images not displayed]

IMPRESSION: 1.  Similar appearing adjacent mixed echotexture areas in the lower inner quadrant of the left breast are probably benign and may represent areas of evolving hematoma and/or fat necrosis. Recommend
follow-up left breast ultrasound in 3 months.
2.  Tiny stable nodule in the left breast on mammogram is probably benign. Recommend follow-up diagnostic mammogram in 6 months. At that time the patient would also be due for her right breast
3.  Findings were addressed and documented. The patient was given a copy of the results at the time of the visit.
BI-RADS Category 3: Probably benign
